# Patient Record
Sex: Female | Born: 1955
Health system: Southern US, Community
[De-identification: ages and names within clinical notes are randomized; demographics above are authoritative.]

## PROBLEM LIST (undated history)

## (undated) DIAGNOSIS — N760 Acute vaginitis: Secondary | ICD-10-CM

## (undated) DIAGNOSIS — E669 Obesity, unspecified: Secondary | ICD-10-CM

## (undated) DIAGNOSIS — J45909 Unspecified asthma, uncomplicated: Secondary | ICD-10-CM

## (undated) DIAGNOSIS — N289 Disorder of kidney and ureter, unspecified: Secondary | ICD-10-CM

## (undated) DIAGNOSIS — E785 Hyperlipidemia, unspecified: Secondary | ICD-10-CM

## (undated) DIAGNOSIS — B009 Herpesviral infection, unspecified: Secondary | ICD-10-CM

## (undated) DIAGNOSIS — N809 Endometriosis, unspecified: Secondary | ICD-10-CM

## (undated) DIAGNOSIS — K219 Gastro-esophageal reflux disease without esophagitis: Secondary | ICD-10-CM

## (undated) DIAGNOSIS — J302 Other seasonal allergic rhinitis: Secondary | ICD-10-CM

## (undated) DIAGNOSIS — N6089 Other benign mammary dysplasias of unspecified breast: Secondary | ICD-10-CM

## (undated) DIAGNOSIS — F329 Major depressive disorder, single episode, unspecified: Secondary | ICD-10-CM

## (undated) DIAGNOSIS — B9689 Other specified bacterial agents as the cause of diseases classified elsewhere: Secondary | ICD-10-CM

## (undated) DIAGNOSIS — E039 Hypothyroidism, unspecified: Secondary | ICD-10-CM

## (undated) DIAGNOSIS — I2510 Atherosclerotic heart disease of native coronary artery without angina pectoris: Secondary | ICD-10-CM

## (undated) DIAGNOSIS — F32A Depression, unspecified: Secondary | ICD-10-CM

## (undated) HISTORY — DX: Acute vaginitis: N76.0

## (undated) HISTORY — DX: Major depressive disorder, single episode, unspecified: F32.9

## (undated) HISTORY — DX: Unspecified asthma, uncomplicated: J45.909

## (undated) HISTORY — PX: LEG SURGERY: SHX1003

## (undated) HISTORY — DX: Endometriosis, unspecified: N80.9

## (undated) HISTORY — DX: Disorder of kidney and ureter, unspecified: N28.9

## (undated) HISTORY — PX: SINUS ENDO W/FUSION: SHX777

## (undated) HISTORY — DX: Depression, unspecified: F32.A

## (undated) HISTORY — DX: Other benign mammary dysplasias of unspecified breast: N60.89

## (undated) HISTORY — DX: Hyperlipidemia, unspecified: E78.5

## (undated) HISTORY — DX: Other seasonal allergic rhinitis: J30.2

## (undated) HISTORY — DX: Other specified bacterial agents as the cause of diseases classified elsewhere: B96.89

## (undated) HISTORY — DX: Herpesviral infection, unspecified: B00.9

## (undated) HISTORY — DX: Obesity, unspecified: E66.9

## (undated) HISTORY — PX: WISDOM TOOTH EXTRACTION: SHX21

## (undated) HISTORY — PX: TUBAL LIGATION: SHX77

## (undated) HISTORY — PX: OTHER SURGICAL HISTORY: SHX169

## (undated) HISTORY — DX: Atherosclerotic heart disease of native coronary artery without angina pectoris: I25.10

## (undated) HISTORY — PX: ABDOMINAL HYSTERECTOMY: SHX81

---

## 1998-06-17 ENCOUNTER — Other Ambulatory Visit: Admission: RE | Admit: 1998-06-17 | Discharge: 1998-06-17 | Payer: Self-pay | Admitting: Otolaryngology

## 1998-06-29 ENCOUNTER — Emergency Department (HOSPITAL_COMMUNITY): Admission: EM | Admit: 1998-06-29 | Discharge: 1998-06-29 | Payer: Self-pay | Admitting: Emergency Medicine

## 1998-09-01 ENCOUNTER — Encounter: Payer: Self-pay | Admitting: Family Medicine

## 1998-09-01 ENCOUNTER — Ambulatory Visit (HOSPITAL_COMMUNITY): Admission: RE | Admit: 1998-09-01 | Discharge: 1998-09-01 | Payer: Self-pay | Admitting: Family Medicine

## 2001-10-13 ENCOUNTER — Encounter: Payer: Self-pay | Admitting: *Deleted

## 2001-10-13 ENCOUNTER — Encounter: Admission: RE | Admit: 2001-10-13 | Discharge: 2001-10-13 | Payer: Self-pay | Admitting: *Deleted

## 2002-12-08 ENCOUNTER — Emergency Department (HOSPITAL_COMMUNITY): Admission: EM | Admit: 2002-12-08 | Discharge: 2002-12-08 | Payer: Self-pay | Admitting: Emergency Medicine

## 2003-05-19 ENCOUNTER — Ambulatory Visit (HOSPITAL_BASED_OUTPATIENT_CLINIC_OR_DEPARTMENT_OTHER): Admission: RE | Admit: 2003-05-19 | Discharge: 2003-05-19 | Payer: Self-pay | Admitting: Family Medicine

## 2004-05-21 ENCOUNTER — Emergency Department (HOSPITAL_COMMUNITY): Admission: EM | Admit: 2004-05-21 | Discharge: 2004-05-21 | Payer: Self-pay | Admitting: Emergency Medicine

## 2004-11-06 ENCOUNTER — Ambulatory Visit (HOSPITAL_COMMUNITY): Admission: RE | Admit: 2004-11-06 | Discharge: 2004-11-06 | Payer: Self-pay | Admitting: Obstetrics and Gynecology

## 2004-11-12 ENCOUNTER — Encounter: Admission: RE | Admit: 2004-11-12 | Discharge: 2004-11-12 | Payer: Self-pay | Admitting: Obstetrics and Gynecology

## 2004-11-17 ENCOUNTER — Other Ambulatory Visit: Admission: RE | Admit: 2004-11-17 | Discharge: 2004-11-17 | Payer: Self-pay | Admitting: Obstetrics and Gynecology

## 2005-05-24 ENCOUNTER — Encounter: Admission: RE | Admit: 2005-05-24 | Discharge: 2005-05-24 | Payer: Self-pay | Admitting: Obstetrics and Gynecology

## 2005-11-22 ENCOUNTER — Encounter: Admission: RE | Admit: 2005-11-22 | Discharge: 2005-11-22 | Payer: Self-pay | Admitting: Obstetrics and Gynecology

## 2005-11-24 ENCOUNTER — Other Ambulatory Visit: Admission: RE | Admit: 2005-11-24 | Discharge: 2005-11-24 | Payer: Self-pay | Admitting: Obstetrics and Gynecology

## 2005-11-29 ENCOUNTER — Encounter: Admission: RE | Admit: 2005-11-29 | Discharge: 2005-11-29 | Payer: Self-pay | Admitting: Obstetrics and Gynecology

## 2005-12-01 ENCOUNTER — Ambulatory Visit: Payer: Self-pay | Admitting: Gastroenterology

## 2006-01-27 ENCOUNTER — Ambulatory Visit: Payer: Self-pay | Admitting: Gastroenterology

## 2006-11-28 ENCOUNTER — Encounter: Admission: RE | Admit: 2006-11-28 | Discharge: 2006-11-28 | Payer: Self-pay | Admitting: Obstetrics and Gynecology

## 2007-03-01 ENCOUNTER — Observation Stay (HOSPITAL_COMMUNITY): Admission: EM | Admit: 2007-03-01 | Discharge: 2007-03-02 | Payer: Self-pay | Admitting: Emergency Medicine

## 2007-12-15 ENCOUNTER — Encounter: Admission: RE | Admit: 2007-12-15 | Discharge: 2007-12-15 | Payer: Self-pay | Admitting: Obstetrics and Gynecology

## 2008-08-20 IMAGING — CR DG CHEST 1V PORT
1 series · 1 of 1 positions shown · non-contrast
Comparison: 05/21/04.

CLINICAL DATA: Chest pain and left arm numbness. 
 PORTABLE CHEST ? 1 VIEW:

[view not recorded]
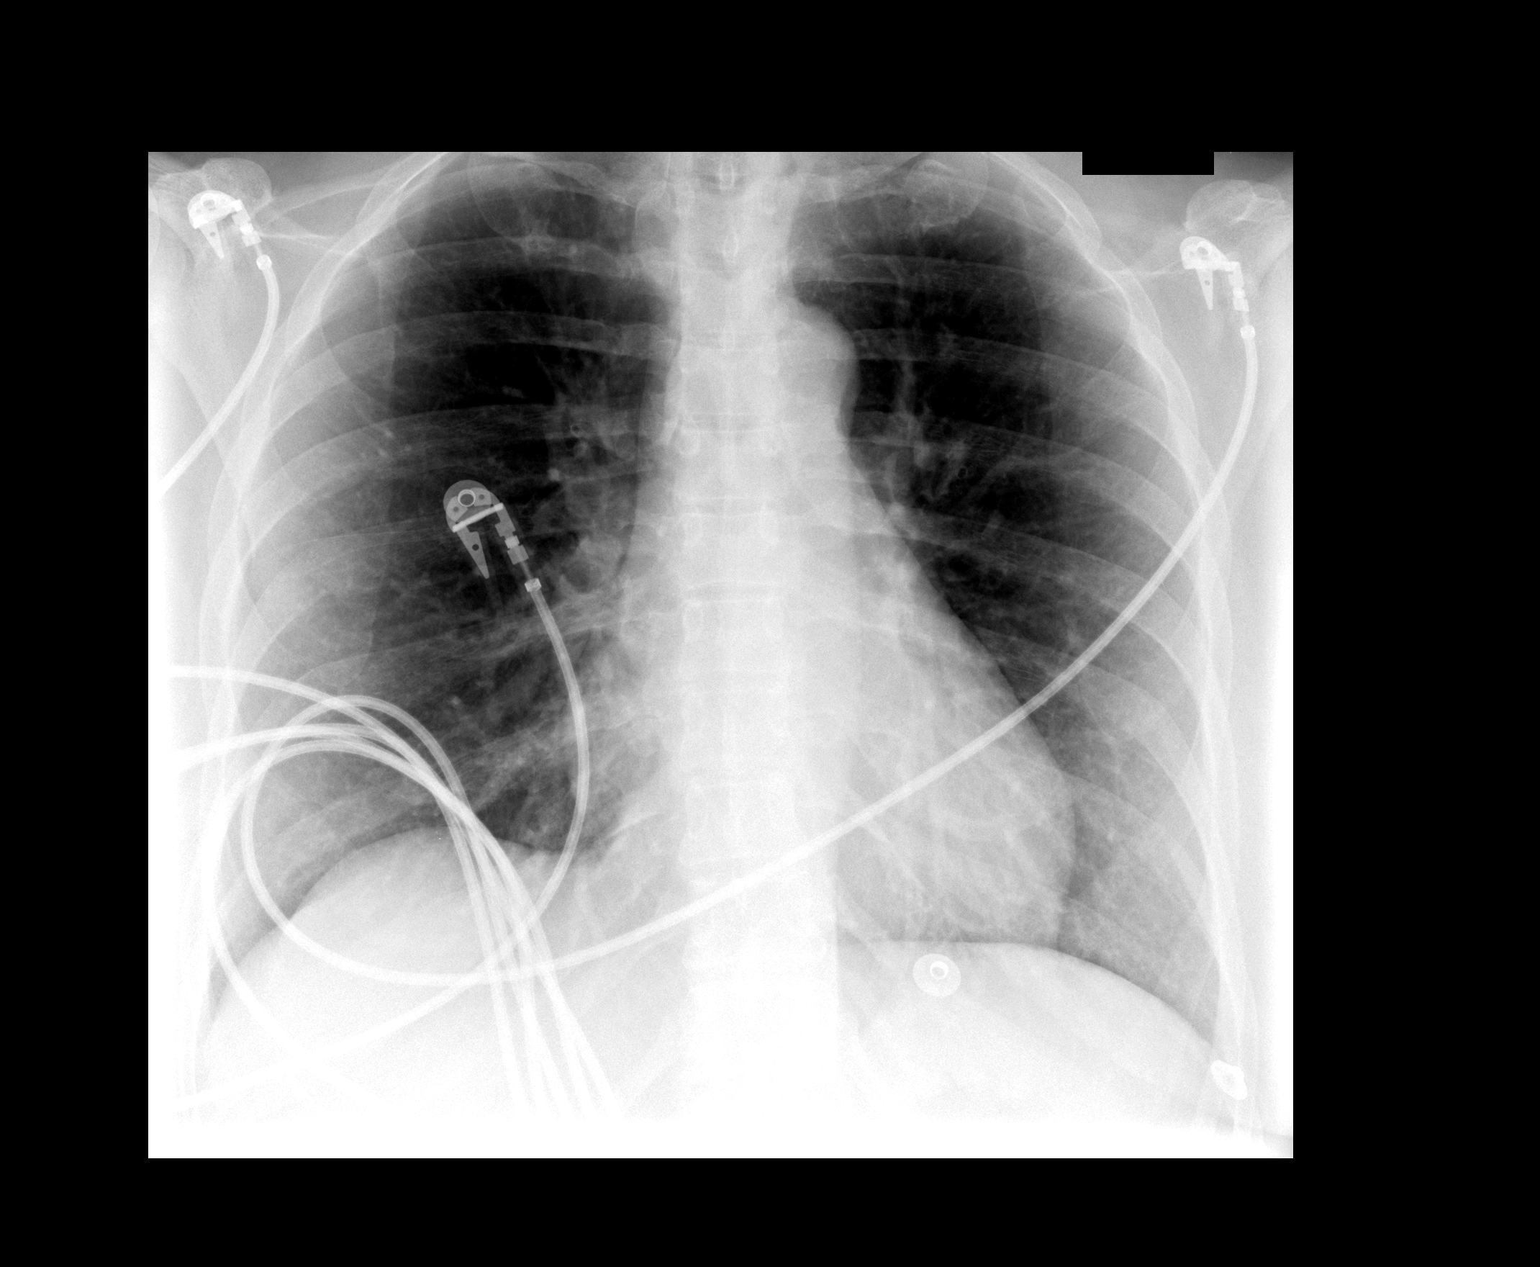

[1 of 1 positions shown; findings below may reference images not displayed]

FINDINGS: Trachea is midline.  Heart size is within normal limits.  Irregular nodular density in the right apex is thought to be due to right 1st costochondral junction, as it is seen in a slightly different projection on the prior study.  Lungs are clear.  No pleural fluid.
IMPRESSION: No acute findings.

## 2009-01-01 ENCOUNTER — Encounter: Admission: RE | Admit: 2009-01-01 | Discharge: 2009-01-01 | Payer: Self-pay | Admitting: Obstetrics and Gynecology

## 2010-01-13 ENCOUNTER — Encounter: Admission: RE | Admit: 2010-01-13 | Discharge: 2010-01-13 | Payer: Self-pay | Admitting: Obstetrics and Gynecology

## 2010-12-22 NOTE — Consult Note (Signed)
[name], [name]              ACCOUNT NO.:  [id number]   MEDICAL RECORD NO.:  [id number]          PATIENT TYPE:  OBV   LOCATION:  4731                         FACILITY:  [facility]   PHYSICIAN:  Corky Crafts, MDDATE OF BIRTH:  1956-03-04   DATE OF CONSULTATION:  03/01/2007  DATE OF DISCHARGE:  03/02/2007                                 CONSULTATION   REASON FOR CONSULTATION:  Chest pain.   HISTORY OF PRESENT ILLNESS:  The patient is a 55 year old women with no  known cardiac problems who had intermittent chest pressure with  radiation into the left shoulder and left arm.  It had been occurring  over the past 2 days.  It was worse this morning so she came to the  emergency room.  It is not related to food.  It is worse with activity.  There is no associated dyspnea, palpitations, PND, dizziness or syncope.  She still reports some chest pain now.  She is under a fair amount of  stress as her son is getting married on Saturday and she has a lot of  preparation to do.   ALLERGIES:  ERYTHROMYCIN CAUSES GI UPSET.   MEDICATIONS:  Premarin, Zyrtec and Mucinex.   SOCIAL HISTORY:  The patient does not smoke.  She does not drink.  She  does not use any illicit drugs.  She works as a IT consultant.   FAMILY HISTORY:  She has a half brother who has coronary artery disease.  Her mom has thyroid problems.   PAST MEDICAL HISTORY:  Gestational diabetes, elevated cholesterol.   PAST SURGICAL HISTORY:  Hysterectomy in 2005.   REVIEW OF SYSTEMS:  Chest pain radiating to the left arm as noted above.  No fever, chills.  No weight loss.  No focal weakness.  No shortness of  breath.  No rash.  All other systems are negative.   PHYSICAL EXAMINATION:  Blood pressure 106/59, pulse 56, respiratory rate  17.  GENERAL:  She is awake, alert in no apparent distress.  HEENT:  Head is normocephalic, atraumatic.  Eyes:  Extraocular movements  are intact.  NECK:  No carotid bruits.  CARDIOVASCULAR:   Regular rate and rhythm.  S1, S2.  LUNGS:  Clear to auscultation bilaterally.  ABDOMEN:  Soft, nontender, nondistended.  EXTREMITIES:  Showed no edema.  SKIN:  Warm and dry.  No rash.   LABORATORY DATA:  Creatinine 0.9, hemoglobin 32.6.  Cardiac enzymes  negative x1.  Chest x-ray pending.  EKG shows normal sinus rhythm.  No  ST-T wave changes.  Normal EKG.   ASSESSMENT:  A 55 year old with chest pain with some typical features,  although some risk factors for heart disease.   PLAN:  1. Given that she is still having pain, will watch her overnight, rule      out for MI.  2 . Anticoagulate her with Lovenox, will give her nitroglycerin.  1. Will plan for a stress Cardiolite in the morning.  2. Also check TSH, D-dimer.  3. Will check a fasting lipid profile as well.      Corky Crafts, MD  Electronically Signed     JSV/MEDQ  D:  03/02/2007  T:  03/02/2007  Job:  [id number]

## 2010-12-22 NOTE — H&P (Signed)
[name], [name]              ACCOUNT NO.:  [id number]   MEDICAL RECORD NO.:  [id number]          PATIENT TYPE:  EMS   LOCATION:  MAJO                         FACILITY:  [facility]   PHYSICIAN:  Corinna L. Lendell Caprice, MDDATE OF BIRTH:  05-21-1956   DATE OF ADMISSION:  03/01/2007  DATE OF DISCHARGE:                              HISTORY & PHYSICAL   CHIEF COMPLAINT:  Chest pain.   HISTORY OF PRESENT ILLNESS:  [name] as a pleasant 55 year old white  female with history of hyperlipidemia who presents to the emergency room  with substernal chest pressure which has been episodic since yesterday.  It is not associated with activity.  There are no exacerbating or  alleviating factors.  She rates the pressure as a 7/10 at its worst.  It  has been lasting about 4 minutes at a time.  She had some soreness of  her chest when she arrived in the emergency room, and it resolved with  two sublingual nitroglycerins.  The pain radiates to her left arm,  particularly to her shoulder, and she has numbness in her left hand when  this occurs.  She has some diaphoresis but no other symptoms associated.  She has never had cardiac workup.  She has no cardiac risk factors other  than the cholesterol as mentioned above.  She has been having a lot of  stress with family members being ill and upcoming wedding of her son.  She thinks this may be stress related.  She also traveled to [place]  last week for the funeral of her stepmother.  She has had no leg pain or  swelling.  She has had no shortness of breath, but she feels like when  this pain occurs she tries not to take a deep breath.  The pain is not  pleuritic.   PAST MEDICAL HISTORY:  1. Hysterectomy for endometriosis.  2. Chronic sinusitis with requiring three sinus surgeries.   MEDICATIONS:  1. Premarin dose unknown.  2. Zyrtec D p.r.n.  3. Flonase occasionally.  4. Mucinex.  5. No medication for cholesterol.   ALLERGIES:  SHE REPORTS A GI  INTOLERANCE TO ERYTHROMYCIN.   FAMILY HISTORY:  Her brother has heart problems but has never had a  myocardial infarction.   SOCIAL HISTORY:  She is married.  She is a IT consultant.  She does not  drink, smoke or use drugs.   REVIEW OF SYSTEMS:  As above, otherwise negative.   PHYSICAL EXAMINATION:  VITAL SIGNS:  Temperature is 97.6, blood pressure  120/68, pulse 71, respiratory rate 18, oxygen saturation 96% on room  air.  GENERAL:  The patient is well-nourished, well-developed and appears  slightly anxious.  HEENT: Normocephalic, atraumatic.  Pupils equal, round, reactive to not  to light.  Extraocular movements are intact.  She has boggy turbinates.  Oropharynx is without erythema or postnasal drip.  NECK:  Supple.  No carotid bruits.  No thyromegaly, JVD.  LUNGS:  Clear to auscultation bilaterally without wheezes, rhonchi or  rales.  CARDIOVASCULAR:  Regular rate and rhythm without murmurs, gallops or  rubs.  ABDOMEN:  Soft,  nontender, nondistended.  GU/RECTAL:  deferred.  Extremities: No clubbing, cyanosis or edema.  No  calf tenderness.  Homan sign negative.  PSYCHIATRIC:  As above.  NEUROLOGIC:  Alert and oriented.  Cranial nerves and sensorimotor exam  are intact.  SKIN:  No rash.   LABORATORY DATA:  CBC is significant for hemoglobin of 11.6, hematocrit  of 33.6, MCV is 84.  Basic metabolic panel unremarkable.  Point care  enzymes negative times one.  EKG shows normal sinus rhythm.  Chest x-ray  is negative.   ASSESSMENT/PLAN:  1. Chest pain.  The patient will be placed on observation, I have      consulted [facility].  She may need a stress test.  I will      also check a D-dimer, but her pain is not very characteristic of      pulmonary embolus.  She received aspirin today, and I will continue      this as well as morphine as needed and Lovenox for DVT prophylaxis.      For now, I will make her n.p.o., but if Cardiology does not do a      stress test, she can  eat a low fat diet.  2. Hyperlipidemia.  Apparently she was encouraged to modify lifestyle      factors and then was to have a repeat lipid panel in the future.  I      will check a fasting lipid panel tomorrow.  completes medication      thinks      Corinna L. Lendell Caprice, MD  Electronically Signed     CLS/MEDQ  D:  03/01/2007  T:  03/01/2007  Job:  [id number]   cc:   Molly Maduro A. Nicholos Johns, M.D.

## 2010-12-22 NOTE — Discharge Summary (Signed)
[name], [name]              ACCOUNT NO.:  [id number]   MEDICAL RECORD NO.:  [id number]          PATIENT TYPE:  OBV   LOCATION:  4731                         FACILITY:  [facility]   PHYSICIAN:  Corinna L. Lendell Caprice, MDDATE OF BIRTH:  Jun 16, 1956   DATE OF ADMISSION:  03/01/2007  DATE OF DISCHARGE:  03/02/2007                               DISCHARGE SUMMARY   DISCHARGE DIAGNOSES:  1. Chest pain, suspect esophageal spasm.  2. Hyperlipidemia.   DISCHARGE MEDICATIONS:  1. Prilosec 20 mg a day.  2. She may continue her other medications as previous.   FOLLOWUP:  Followup with Dr. Nicholos Johns as needed.   CONDITION ON DISCHARGE:  Condition stable.   CONSULTATIONS:  Dr. Eldridge Dace.   PROCEDURE:  None.   DIET:  Diet should be low cholesterol.   ACTIVITY:  Activity ad lib.   PERTINENT LABORATORY AND X-RAY DATA:  Serial cardiac enzymes negative, D-  dimer less than 0.22.  CBC significant for hemoglobin of 11.6,  hematocrit 33.6.  Total cholesterol 197, LDL 107, HDL 41,, triglycerides  are 243.  TSH 4.490.   Special studies in radiology; chest x-Ray negative. Stress Cardiolite no  ischemia, ejection fraction 84%.  EKG normal sinus rhythm.   HISTORY AND HOSPITAL COURSE:  [name] is 55 year old white female  patient with hyperlipidemia who presented with chest pressure that  radiated to her left arm, please see H and P for complete details. She  had resolution of her symptoms with sublingual nitroglycerin.  She was  placed on 23-hour observation on telemetry where she remained in normal  sinus rhythm.  MI was ruled out.  She was continued on aspirin.  Cardiology was consulted and the patient was started on Lovenox and  nitroglycerin drip. A Cardiolite was negative.  I suspect she  has most likely silent esophageal reflux disease and possibly some  spasms.  I have therefore written a prescription for Prilosec.  She also  reports having a lot of life stressors and this may also be related  to  anxiety, although she had no feelings of panic during the episodes of  chest pain. Her physical exam was unremarkable as were her vital signs.      Corinna L. Lendell Caprice, MD  Electronically Signed     CLS/MEDQ  D:  03/02/2007  T:  03/03/2007  Job:  [id number]   cc:   Molly Maduro A. Nicholos Johns, M.D.

## 2011-01-08 ENCOUNTER — Other Ambulatory Visit: Payer: Self-pay | Admitting: Obstetrics and Gynecology

## 2011-01-08 DIAGNOSIS — Z1231 Encounter for screening mammogram for malignant neoplasm of breast: Secondary | ICD-10-CM

## 2011-01-20 ENCOUNTER — Ambulatory Visit
Admission: RE | Admit: 2011-01-20 | Discharge: 2011-01-20 | Disposition: A | Discharge status: Home / Self Care | Payer: PRIVATE HEALTH INSURANCE | Source: Ambulatory Visit | Attending: Obstetrics and Gynecology | Admitting: Obstetrics and Gynecology

## 2011-01-20 DIAGNOSIS — Z1231 Encounter for screening mammogram for malignant neoplasm of breast: Secondary | ICD-10-CM

## 2011-01-22 ENCOUNTER — Other Ambulatory Visit: Payer: Self-pay | Admitting: Obstetrics and Gynecology

## 2011-01-22 DIAGNOSIS — R928 Other abnormal and inconclusive findings on diagnostic imaging of breast: Secondary | ICD-10-CM

## 2011-01-27 ENCOUNTER — Ambulatory Visit
Admission: RE | Admit: 2011-01-27 | Discharge: 2011-01-27 | Disposition: A | Discharge status: Home / Self Care | Payer: PRIVATE HEALTH INSURANCE | Source: Ambulatory Visit | Attending: Obstetrics and Gynecology | Admitting: Obstetrics and Gynecology

## 2011-01-27 DIAGNOSIS — R928 Other abnormal and inconclusive findings on diagnostic imaging of breast: Secondary | ICD-10-CM

## 2011-05-24 LAB — CBC
HCT: 33.6 — ABNORMAL LOW
Hemoglobin: 11.6 — ABNORMAL LOW
MCHC: 34.4
MCV: 84.5
RDW: 12.2

## 2011-05-24 LAB — DIFFERENTIAL
Basophils Absolute: 0.1
Basophils Relative: 1
Eosinophils Absolute: 0.1
Lymphocytes Relative: 28
Monocytes Relative: 6

## 2011-05-24 LAB — I-STAT 8, (EC8 V) (CONVERTED LAB)
Bicarbonate: 24
Chloride: 107
Glucose, Bld: 88
HCT: 34 — ABNORMAL LOW
Potassium: 3.8
TCO2: 25
pCO2, Ven: 35.1 — ABNORMAL LOW

## 2011-05-24 LAB — LIPID PANEL: VLDL: 49 — ABNORMAL HIGH

## 2011-05-24 LAB — TROPONIN I: Troponin I: 0.01

## 2011-05-24 LAB — CARDIAC PANEL(CRET KIN+CKTOT+MB+TROPI)
CK, MB: 0.7
Relative Index: INVALID
Total CK: 34
Total CK: 35
Troponin I: 0.01

## 2011-05-24 LAB — CK TOTAL AND CKMB (NOT AT ARMC)
CK, MB: 0.8
Relative Index: INVALID
Total CK: 37

## 2011-05-24 LAB — D-DIMER, QUANTITATIVE: D-Dimer, Quant: <0.22

## 2011-05-24 LAB — PROTIME-INR: INR: 1

## 2011-05-24 LAB — POCT CARDIAC MARKERS
Myoglobin, poc: 62.8
Operator id: 198171

## 2011-07-10 ENCOUNTER — Encounter: Payer: Self-pay | Admitting: *Deleted

## 2011-07-10 ENCOUNTER — Emergency Department (HOSPITAL_COMMUNITY)
Admission: EM | Admit: 2011-07-10 | Discharge: 2011-07-11 | Disposition: A | Discharge status: Home / Self Care | Payer: PRIVATE HEALTH INSURANCE | Attending: Emergency Medicine | Admitting: Emergency Medicine

## 2011-07-10 ENCOUNTER — Emergency Department (HOSPITAL_COMMUNITY): Payer: PRIVATE HEALTH INSURANCE

## 2011-07-10 DIAGNOSIS — K7689 Other specified diseases of liver: Secondary | ICD-10-CM | POA: Insufficient documentation

## 2011-07-10 DIAGNOSIS — R1013 Epigastric pain: Secondary | ICD-10-CM | POA: Insufficient documentation

## 2011-07-10 DIAGNOSIS — IMO0001 Reserved for inherently not codable concepts without codable children: Secondary | ICD-10-CM

## 2011-07-10 DIAGNOSIS — R109 Unspecified abdominal pain: Secondary | ICD-10-CM | POA: Insufficient documentation

## 2011-07-10 DIAGNOSIS — K219 Gastro-esophageal reflux disease without esophagitis: Secondary | ICD-10-CM | POA: Insufficient documentation

## 2011-07-10 HISTORY — DX: Hypothyroidism, unspecified: E03.9

## 2011-07-10 HISTORY — DX: Gastro-esophageal reflux disease without esophagitis: K21.9

## 2011-07-10 LAB — COMPREHENSIVE METABOLIC PANEL
ALT: 9 U/L (ref 0–35)
AST: 11 U/L (ref 0–37)
Albumin: 3.4 g/dL — ABNORMAL LOW (ref 3.5–5.2)
Alkaline Phosphatase: 54 U/L (ref 39–117)
BUN: 15 mg/dL (ref 6–23)
CO2: 27 mEq/L (ref 19–32)
Calcium: 9 mg/dL (ref 8.4–10.5)
Chloride: 108 mEq/L (ref 96–112)
Creatinine, Ser: 0.84 mg/dL (ref 0.50–1.10)
GFR calc Af Amer: 89 mL/min — ABNORMAL LOW (ref >90)
GFR calc non Af Amer: 77 mL/min — ABNORMAL LOW (ref >90)
Glucose, Bld: 89 mg/dL (ref 70–99)
Potassium: 4 mEq/L (ref 3.5–5.1)
Sodium: 141 mEq/L (ref 135–145)
Total Bilirubin: 0.3 mg/dL (ref 0.3–1.2)
Total Protein: 6.3 g/dL (ref 6.0–8.3)

## 2011-07-10 LAB — URINALYSIS, ROUTINE W REFLEX MICROSCOPIC
Bilirubin Urine: NEGATIVE
Glucose, UA: NEGATIVE mg/dL
Hgb urine dipstick: NEGATIVE
Ketones, ur: NEGATIVE mg/dL
Leukocytes, UA: NEGATIVE
Nitrite: NEGATIVE
Protein, ur: NEGATIVE mg/dL
Specific Gravity, Urine: 1.006 (ref 1.005–1.030)
Urobilinogen, UA: 0.2 mg/dL (ref 0.0–1.0)
pH: 6 (ref 5.0–8.0)

## 2011-07-10 LAB — CBC
HCT: 33.6 % — ABNORMAL LOW (ref 36.0–46.0)
Hemoglobin: 11.1 g/dL — ABNORMAL LOW (ref 12.0–15.0)
MCH: 28.2 pg (ref 26.0–34.0)
MCHC: 33 g/dL (ref 30.0–36.0)
MCV: 85.3 fL (ref 78.0–100.0)
Platelets: 211 10*3/uL (ref 150–400)
RBC: 3.94 MIL/uL (ref 3.87–5.11)
RDW: 12.8 % (ref 11.5–15.5)
WBC: 8.2 10*3/uL (ref 4.0–10.5)

## 2011-07-10 LAB — DIFFERENTIAL
Basophils Absolute: 0 10*3/uL (ref 0.0–0.1)
Basophils Relative: 0 % (ref 0–1)
Eosinophils Absolute: 0.2 10*3/uL (ref 0.0–0.7)
Eosinophils Relative: 2 % (ref 0–5)
Lymphocytes Relative: 39 % (ref 12–46)
Lymphs Abs: 3.2 10*3/uL (ref 0.7–4.0)
Monocytes Absolute: 0.5 10*3/uL (ref 0.1–1.0)
Monocytes Relative: 7 % (ref 3–12)
Neutro Abs: 4.3 10*3/uL (ref 1.7–7.7)
Neutrophils Relative %: 52 % (ref 43–77)

## 2011-07-10 LAB — POCT I-STAT TROPONIN I: Troponin i, poc: 0 ng/mL (ref 0.00–0.08)

## 2011-07-10 LAB — LIPASE, BLOOD: Lipase: 25 U/L (ref 11–59)

## 2011-07-10 MED ORDER — MORPHINE SULFATE 4 MG/ML IJ SOLN: 4.0000 mg | Freq: Once | INTRAMUSCULAR | Status: DC

## 2011-07-10 MED ORDER — MORPHINE SULFATE 2 MG/ML IJ SOLN
INTRAMUSCULAR | Status: AC
  Administered 2011-07-10: 4 mg via INTRAVENOUS
  Filled 2011-07-10: qty 2

## 2011-07-10 MED ORDER — SODIUM CHLORIDE 0.9 % IV SOLN: INTRAVENOUS | Status: DC

## 2011-07-10 MED ORDER — GI COCKTAIL ~~LOC~~
30.0000 mL | Freq: Once | ORAL | Status: AC
  Administered 2011-07-10: 30 mL via ORAL
  Filled 2011-07-10: qty 30

## 2011-07-10 MED ORDER — ONDANSETRON HCL 4 MG/2ML IJ SOLN
4.0000 mg | Freq: Once | INTRAMUSCULAR | Status: AC
  Administered 2011-07-10: 4 mg via INTRAVENOUS
  Filled 2011-07-10: qty 2

## 2011-07-10 NOTE — ED Notes (Signed)
Pt states that she is experiencing epigastric pain since roughly 1300 today.  Pt denies N/V/D or fever.  Pt also denies hx of same.

## 2011-07-10 NOTE — ED Notes (Signed)
Pt states she had an onset of upper abd pain going through to her back, knife like pain, nausea with pain

## 2011-07-10 NOTE — ED Provider Notes (Signed)
History     CSN: [id number] Arrival date & time: 07/10/2011  6:15 PM   First MD Initiated Contact with Patient 07/10/11 2028      Chief Complaint  Patient presents with  . Abdominal Pain     HPI: Reports onset of severe epigastric pain today at approx 1400 approx 1 hr after eating a meal. Pain has been constant, sharp and associated w/ severe nausea w/o vomiting. Denies fever, SOB or other associated sx's.   Past Medical History  Diagnosis Date  . Acid reflux   . Hypothyroid     Past Surgical History  Procedure Date  . Abdominal hysterectomy   . Cesarean section     No family history on file.  History  Substance Use Topics  . Smoking status: Never Smoker   . Smokeless tobacco: Never Used  . Alcohol Use: No    OB History    Grav Para Term Preterm Abortions TAB SAB Ect Mult Living                  Review of Systems  Constitutional: Negative.   HENT: Negative.   Eyes: Negative.   Respiratory: Negative.   Cardiovascular: Negative.   Gastrointestinal: Negative.   Genitourinary: Negative.   Musculoskeletal: Negative.   Skin: Negative.   Neurological: Negative.   Hematological: Negative.   Psychiatric/Behavioral: Negative.     Allergies  Erythromycin; Augmentin; and Darvocet  Home Medications   Current Outpatient Rx  Name Route Sig Dispense Refill  . CALCIUM CARBONATE-VITAMIN D 500-200 MG-UNIT PO TABS Oral Take 1 tablet by mouth daily.      Marland Kitchen CYPROHEPTADINE HCL 4 MG PO TABS Oral Take 4 mg by mouth at bedtime.     . DEXLANSOPRAZOLE 60 MG PO CPDR Oral Take 60 mg by mouth daily.      Marland Kitchen ESTROGENS CONJUGATED 0.9 MG PO TABS Oral Take 0.9 mg by mouth daily.     Marland Kitchen HYOSCYAMINE SULFATE 0.125 MG SL SUBL Sublingual Place 0.125 mg under the tongue every 4 (four) hours as needed. Irritable bowel    . LEVOTHYROXINE SODIUM 75 MCG PO TABS Oral Take 75 mcg by mouth daily.      Carma Leaven M PLUS PO TABS Oral Take 1 tablet by mouth daily.      Marland Kitchen VITAMIN B-12 1000 MCG PO TABS  Oral Take 1,000 mcg by mouth daily.        BP 127/72  Pulse 62  Temp(Src) 97.4 F (36.3 C) (Oral)  Resp 17  SpO2 97%  Physical Exam  Constitutional: She is oriented to person, place, and time. She appears well-developed and well-nourished.  HENT:  Head: Normocephalic and atraumatic.  Eyes: Conjunctivae are normal.  Neck: Neck supple.  Cardiovascular: Normal rate and regular rhythm.   Pulmonary/Chest: Effort normal and breath sounds normal.  Abdominal: Soft. Bowel sounds are normal.    Musculoskeletal: Normal range of motion.  Neurological: She is alert and oriented to person, place, and time. She has normal reflexes.  Skin: Skin is warm and dry.  Psychiatric: She has a normal mood and affect.    ED Course  Procedures Pt reports pain much improved w/ IVF's and medication. Findings, impression and d/c plan discussed. Pt agreeable w/ plan.  Labs Reviewed  CBC - Abnormal; Notable for the following:    Hemoglobin 11.1 (*)    HCT 33.6 (*)    All other components within normal limits  COMPREHENSIVE METABOLIC PANEL - Abnormal; Notable for the  following:    Albumin 3.4 (*)    GFR calc non Af Amer 77 (*)    GFR calc Af Amer 89 (*)    All other components within normal limits  DIFFERENTIAL  LIPASE, BLOOD  URINALYSIS, ROUTINE W REFLEX MICROSCOPIC  POCT I-STAT TROPONIN I  I-STAT TROPONIN I   US Abdomen Complete  07/10/2011  *RADIOLOGY REPORT*  Clinical Data:  Epigastric pain and nausea  ABDOMINAL ULTRASOUND COMPLETE  Comparison:  None.  Findings:  Gallbladder:  No gallstones, gallbladder wall thickening, or pericholecystic fluid.Negative sonographic Murphy's sign.  Common Bile Duct:  Within normal limits in caliber.Measures 5 mm.  Liver: A circumscribed 1.1 x 1.4 x 1.3 cm cyst with a single thin internal septation is noted in the left lobe.  Within normal limits in parenchymal echogenicity.  IVC:  Appears normal.  Pancreas:  Although the pancreas is difficult to visualize in its  entirety, no focal pancreatic abnormality is identified.  Spleen:  Within normal limits in size and echotexture.  Right kidney:  Normal in size and parenchymal echogenicity.  No evidence of mass or hydronephrosis.  Left kidney:  Normal in size and parenchymal echogenicity.  No evidence of mass or hydronephrosis.  Abdominal Aorta:  No aneurysm identified.  IMPRESSION:  1.  No acute findings are explanation for right upper quadrant abdominal pain. 2.  1.4 cm left hepatic lobe cyst.  Original Report Authenticated By: Britta Mccreedy, M.D.     No diagnosis found.    MDM  HPI highly suspicious for cholelithiasis, however U/S negative for acute upper abd process. Cardiac source of pain considered, though HPI and PE do not support. EKG normal and Troponin I negatice. Suspect Reflux as pt has hx and is already on PPI. Will d/c home w/ meds for pain and nausea and encourage close f/u w/ PCP this week.      Leanne Chang, NP 07/11/11 [phone]

## 2011-07-10 NOTE — ED Provider Notes (Cosign Needed)
Date: 9:58 PM    Rate: 56  Rhythm: normal sinus rhythm  QRS Axis: normal  Intervals: normal  ST/T Wave abnormalities: normal  Conduction Disutrbances:none  Narrative Interpretation:   Old EKG Reviewed: none available  Medical screening examination/treatment/procedure(s) were performed by non-physician practitioner and as supervising physician I was immediately available for consultation/collaboration.   Celene Kras, MD 07/10/11 2159

## 2011-07-11 MED ORDER — HYDROCODONE-ACETAMINOPHEN 5-325 MG PO TABS: 1.0000 | ORAL_TABLET | ORAL | Status: AC | PRN

## 2011-07-11 MED ORDER — ONDANSETRON HCL 4 MG PO TABS: 4.0000 mg | ORAL_TABLET | Freq: Four times a day (QID) | ORAL | Status: AC

## 2011-07-11 NOTE — ED Provider Notes (Signed)
Medical screening examination/treatment/procedure(s) were performed by non-physician practitioner and as supervising physician I was immediately available for consultation/collaboration.  Celene Kras, MD 07/11/11 [phone]

## 2012-01-17 ENCOUNTER — Other Ambulatory Visit: Payer: Self-pay | Admitting: Obstetrics and Gynecology

## 2012-01-17 DIAGNOSIS — Z1231 Encounter for screening mammogram for malignant neoplasm of breast: Secondary | ICD-10-CM

## 2012-01-25 ENCOUNTER — Ambulatory Visit: Payer: PRIVATE HEALTH INSURANCE

## 2012-01-25 ENCOUNTER — Ambulatory Visit
Admission: RE | Admit: 2012-01-25 | Discharge: 2012-01-25 | Disposition: A | Discharge status: Home / Self Care | Payer: PRIVATE HEALTH INSURANCE | Source: Ambulatory Visit | Attending: Otolaryngology | Admitting: Otolaryngology

## 2012-01-25 ENCOUNTER — Other Ambulatory Visit: Payer: Self-pay | Admitting: Otolaryngology

## 2012-01-25 DIAGNOSIS — J3489 Other specified disorders of nose and nasal sinuses: Secondary | ICD-10-CM

## 2012-01-25 DIAGNOSIS — R0602 Shortness of breath: Secondary | ICD-10-CM

## 2012-03-06 ENCOUNTER — Ambulatory Visit
Admission: RE | Admit: 2012-03-06 | Discharge: 2012-03-06 | Disposition: A | Discharge status: Home / Self Care | Payer: PRIVATE HEALTH INSURANCE | Source: Ambulatory Visit | Attending: Obstetrics and Gynecology | Admitting: Obstetrics and Gynecology

## 2012-03-06 DIAGNOSIS — Z1231 Encounter for screening mammogram for malignant neoplasm of breast: Secondary | ICD-10-CM

## 2012-10-03 ENCOUNTER — Ambulatory Visit: Payer: PRIVATE HEALTH INSURANCE | Admitting: Obstetrics and Gynecology

## 2012-10-03 ENCOUNTER — Encounter: Payer: Self-pay | Admitting: Obstetrics and Gynecology

## 2012-10-03 VITALS — BP 120/70 | Temp 98.3°F | Ht 66.0 in | Wt 203.0 lb

## 2012-10-03 DIAGNOSIS — Z01419 Encounter for gynecological examination (general) (routine) without abnormal findings: Secondary | ICD-10-CM

## 2012-10-03 DIAGNOSIS — R32 Unspecified urinary incontinence: Secondary | ICD-10-CM

## 2012-10-03 LAB — POCT URINALYSIS DIPSTICK
Blood, UA: NEGATIVE
Nitrite, UA: NEGATIVE
Spec Grav, UA: 1.01
Urobilinogen, UA: NEGATIVE
pH, UA: 5

## 2012-10-03 MED ORDER — ESTRADIOL 10 MCG VA TABS: 10.0000 ug | ORAL_TABLET | VAGINAL | Status: DC

## 2012-10-03 MED ORDER — ESTROGENS CONJUGATED 0.9 MG PO TABS: 0.9000 mg | ORAL_TABLET | Freq: Every day | ORAL | Status: DC

## 2012-10-03 NOTE — Progress Notes (Signed)
Regular Periods: no Mammogram: yes  Monthly Breast Ex.: yes Exercise: yes  Tetanus < 10 years: yes Seatbelts: yes  NI. Bladder Functn.: no pt feels pressure;do not feel like she is empties bladder and has lost a little control with urination Abuse at home: no  Daily BM's: yes Stressful Work: yes  Healthy Diet: yes Sigmoid-Colonoscopy: age 57  Calcium: yes Medical problems this year: no other problems;just the bladder   LAST PAP:7/12  Contraception: btl  Mammogram:  6/13  PCP: Dr. Nicholos Johns  PMH: no change  FMH: no change  Last Bone Scan: age 89  Pt is married.

## 2012-10-03 NOTE — Progress Notes (Signed)
Subjective:    [name] is a 57 y.o. female, G3P3, who presents for an annual exam. The patient reports urinary incontinence that has worsened over the past 6 months.  Has noticed some lower back discomfort, mild discomfort with urination (mild burning), urgency but no constipation or bowel changes.  Menstrual cycle:   LMP: No LMP recorded. Patient has had a hysterectomy.             Review of Systems Pertinent items are noted in HPI. Denies pelvic pain, urinary tract symptoms, vaginitis symptoms, irregular bleeding, menopausal symptoms, change in bowel habits or rectal bleeding   Objective:    BP 120/70  Temp(Src) 98.3 F (36.8 C) (Oral)  Ht 5\' 6"  (1.676 m)  Wt 203 lb (92.08 kg)  BMI 32.78 kg/m2   Wt Readings from Last 1 Encounters:  10/03/12 203 lb (92.08 kg)   Body mass index is 32.78 kg/(m^2). General Appearance: Alert, no acute distress HEENT: Grossly normal Neck / Thyroid: Supple, no thyromegaly or cervical adenopathy Lungs: Clear to auscultation bilaterally Back: No CVA tenderness Breast Exam: No masses or nodes.No dimpling, nipple retraction or discharge. Cardiovascular: Regular rate and rhythm.  Gastrointestinal: Soft, non-tender, no masses or organomegaly Pelvic Exam: EGBUS-atrophic, vagina-atrophic with mild relaxation and no lesions;  cervix/uterus-surgically absent; adnexae-no masses or tenderness Rectovaginal: no masses and normal sphincter tone Lymphatic Exam: Non-palpable nodes in neck, clavicular,  axillary, or inguinal regions  Skin: no rashes or abnormalities Extremities: no clubbing cyanosis or edema  Neurologic: grossly normal Psychiatric: Alert and oriented   Assessment:   Routine GYN Exam Urinary Incontinence Vulvovaginal Atrophy   Plan:    Reviewed revised PAP guidelines for hysterectomized patients  Reviewed management options for incontinence-surgical and medical  Patient to try a trial of vaginal estrogen x 8 weeks for her  symptoms and if no results will schedule  consult with M.D.  RTO 1 year or prn  Tabathia Knoche,ELMIRAPA-C

## 2013-07-03 ENCOUNTER — Ambulatory Visit (INDEPENDENT_AMBULATORY_CARE_PROVIDER_SITE_OTHER): Payer: PRIVATE HEALTH INSURANCE | Admitting: Family Medicine

## 2013-07-03 ENCOUNTER — Encounter: Payer: Self-pay | Admitting: Family Medicine

## 2013-07-03 VITALS — BP 156/93 | HR 68 | Ht 66.0 in | Wt 195.0 lb

## 2013-07-03 DIAGNOSIS — M2140 Flat foot [pes planus] (acquired), unspecified foot: Secondary | ICD-10-CM

## 2013-07-03 DIAGNOSIS — S86899A Other injury of other muscle(s) and tendon(s) at lower leg level, unspecified leg, initial encounter: Secondary | ICD-10-CM

## 2013-07-03 DIAGNOSIS — IMO0002 Reserved for concepts with insufficient information to code with codable children: Secondary | ICD-10-CM

## 2013-07-03 NOTE — Progress Notes (Addendum)
CC: Bilateral shin pain, for orthotics HPI: Patient is a very pleasant 57 year old female who presents for evaluation of bilateral shin pain and desire for custom orthotics today. She is currently in a running group and is trying to work up to running a 5K. They're doing 4 miles 2 times per week. However, every time she tries to start running she gets pain in her bilateral shins, right greater than left. She does have a pair of orthotics that was 57 years old that was made for her when she had refractory plantar fasciitis. These are extremely worn down and broken down and when she tried to wear them in her shoes they were very uncomfortable. She has continued to have shown pain and has been unable to run as a result. She was noticed at the orthopedic office to have pes planus and over pronation and so was sent here for evaluation for custom orthotics.  ROS: As above in the HPI. All other systems are stable or negative.  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 She does not smoke. She drinks alcohol socially about once a month. Family: Family history is positive for diabetes in grandmother, heart disease in grandparents and parents, and high blood pressure in grandmother  Allergies: Erythromycin and Augmentin    OBJECTIVE: APPEARANCE:  Patient in no acute distress.The patient appeared well nourished and normally developed. HEENT: No scleral icterus. Conjunctiva non-injected Resp: Non labored Skin: No rash MSK:  - Patient is diffusely tender to palpation along her bilateral shins at the medial border of the tibia - Pes planus with pronation - Normal motion of the great toe bilaterally with no hallux rigidus - She is able to perform a heel raise with normal posterior tibialis function - Weakness on hip abduction and extension bilaterally - Normal hip flexor strength   ASSESSMENT: #1.  Bilateral medial tibial  stress syndrome in a patient with significant pes planus and pronation as well as history of refractory plantar fasciitis  PLAN: Patient will be fitted for custom orthotic today. I recommended that she start to wear these immediately in her running shoes and she moves them to any shoes that she is going to be active in. She already does have good quality work shoes with excellent arch support such do not think she needs any orthotic in her work shoes. I have encouraged her to gradually ease back into running with alternating walking and jogging. She is okay to try the [place] trot on Thanksgiving. She was also given some hip exercises including hip abduction, extension, and standing hip rotation is to help correct her hip weakness. I also gave her some eccentric heel raises and foot dorsi flexion exercises to help with her shin splints.  Patient was fitted for a : standard, cushioned, semi-rigid orthotic. The orthotic was heated and afterward the patient stood on the orthotic blank positioned on the orthotic stand. The patient was positioned in subtalar neutral position and 10 degrees of ankle dorsiflexion in a weight bearing stance. After completion of molding, a stable base was applied to the orthotic blank. The blank was ground to a stable position for weight bearing. Size: 7 Base: EVA Posting: None Additional orthotic padding: None  Greater than 45 min was spent in this encounter for history, exam, and creation of custom orthotics.

## 2013-07-03 NOTE — Patient Instructions (Signed)
Thank you for coming in today  Try your orthotics out - call us if you need any adjustments Okay to try [place] trot Do hip and lower leg exercises daily Okay to start running as tolerated.

## 2013-07-11 ENCOUNTER — Encounter: Payer: PRIVATE HEALTH INSURANCE | Admitting: Emergency Medicine

## 2013-10-02 ENCOUNTER — Other Ambulatory Visit: Payer: Self-pay

## 2013-10-02 DIAGNOSIS — Z1231 Encounter for screening mammogram for malignant neoplasm of breast: Secondary | ICD-10-CM

## 2013-10-15 ENCOUNTER — Ambulatory Visit
Admission: RE | Admit: 2013-10-15 | Discharge: 2013-10-15 | Disposition: A | Discharge status: Home / Self Care | Payer: PRIVATE HEALTH INSURANCE | Source: Ambulatory Visit

## 2013-10-15 DIAGNOSIS — Z1231 Encounter for screening mammogram for malignant neoplasm of breast: Secondary | ICD-10-CM

## 2013-11-04 ENCOUNTER — Encounter (HOSPITAL_COMMUNITY): Payer: Self-pay | Admitting: Emergency Medicine

## 2013-11-04 ENCOUNTER — Emergency Department (HOSPITAL_COMMUNITY)
Admission: EM | Admit: 2013-11-04 | Discharge: 2013-11-04 | Disposition: A | Discharge status: Home / Self Care | Payer: PRIVATE HEALTH INSURANCE | Source: Home / Self Care | Attending: Family Medicine | Admitting: Family Medicine

## 2013-11-04 DIAGNOSIS — M5412 Radiculopathy, cervical region: Secondary | ICD-10-CM

## 2013-11-04 MED ORDER — GABAPENTIN 300 MG PO CAPS: 300.0000 mg | ORAL_CAPSULE | Freq: Every day | ORAL | Status: DC

## 2013-11-04 MED ORDER — MELOXICAM 15 MG PO TABS: 15.0000 mg | ORAL_TABLET | Freq: Every day | ORAL | Status: DC

## 2013-11-04 NOTE — ED Notes (Signed)
States has been doing a lot of lifting lately; started with right shoulder pain approx 3-4 wks ago - was improving with massage and chiropractor.  Over past 3 days has become significantly worse.  C/O difficulty lifting RUE due to pain; c/o numbness radiating down into fingers.  Difficulty sleeping.  Has been using Tyl, ice, and heat.

## 2013-11-04 NOTE — Discharge Instructions (Signed)
Ms. [name],   Finger the pain you're having is due to a pinched nerve in her neck. I will prescribe a daily anti-inflammatory to take. I will also prescribe a medication called gabapentin which will decrease the sensitivity of that nerve. Is important to followup with sports medicine in the near future because I think you need imaging of your neck.  Sincerely,   Dr. Clinton SawyerWilliamson

## 2013-11-04 NOTE — ED Provider Notes (Signed)
CSN: [id number]     Arrival date & time 11/04/13  1103 History   First MD Initiated Contact with Patient 11/04/13 1202     Chief Complaint  Patient presents with  . Shoulder Pain   (Consider location/radiation/quality/duration/timing/severity/associated sxs/prior Treatment) HPI  58 year old F with shoulder pain, neck and arm pain. She has chronic issues with neck pain and states she's been diagnosed with cervical spondylosis before. She routinely goes to a chiropractor and received massage therapy. Over the past 3-4 weeks however she's had worsening pain with shooting numbness and tingling down her right arm all the way to her hand and fingers. She denies weakness of the upper services is hard to certain tasks including driving with her right arm. She has tried ibuprofen intermittently for this pain. Otherwise she is not taking any medications.  Denies any recent trauma. No history of surgery. No MRI.    Past Medical History  Diagnosis Date  . Acid reflux   . Hypothyroid   . HSV-1 (herpes simplex virus 1) infection   . BV (bacterial vaginosis)   . Apocrine metaplasia of breast   . Obese   . Hyperlipidemia   . Endometriosis     h/o  . Depression     h/o  . Asthma    Past Surgical History  Procedure Laterality Date  . Abdominal hysterectomy    . Wisdom tooth extraction    . Tubal ligation    . Cesarean section      x3  . Sinus endo w/fusion      x3  . Leg surgery      had non cancerous tumors removed from left leg   Family History  Problem Relation Age of Onset  . Stroke Other   . Asthma Mother   . Mitral valve prolapse Mother   . Thyroid disease Mother   . Heart disease Brother   . Mitral valve prolapse Brother   . Cancer Maternal Aunt     uterine and liver and kidney  . Cancer Maternal Uncle   . Emphysema Maternal Uncle   . Diabetes Maternal Grandmother   . Heart disease Maternal Grandmother   . Thyroid disease Maternal Grandmother   . Diabetes Paternal  Grandmother   . Diabetes Paternal Grandfather    History  Substance Use Topics  . Smoking status: Never Smoker   . Smokeless tobacco: Never Used  . Alcohol Use: No   OB History   Grav Para Term Preterm Abortions TAB SAB Ect Mult Living   3 3        3      Review of Systems Negative for chest pain, shortness of breath, abdominal pain  Allergies  Erythromycin; Amoxicillin-pot clavulanate; and Darvocet  Home Medications   Current Outpatient Rx  Name  Route  Sig  Dispense  Refill  . atorvastatin (LIPITOR) 20 MG tablet   Oral   Take 20 mg by mouth daily.         . calcium-vitamin D (OSCAL WITH D) 500-200 MG-UNIT per tablet   Oral   Take 1 tablet by mouth daily.           . cyproheptadine (PERIACTIN) 4 MG tablet   Oral   Take 4 mg by mouth at bedtime.          Marland Kitchen dexlansoprazole (DEXILANT) 60 MG capsule   Oral   Take 60 mg by mouth daily.           Marland Kitchen  estrogens, conjugated, (PREMARIN) 0.9 MG tablet   Oral   Take 1 tablet (0.9 mg total) by mouth daily.   30 tablet   11   . levothyroxine (SYNTHROID, LEVOTHROID) 75 MCG tablet   Oral   Take 75 mcg by mouth daily.           . mometasone (ASMANEX) 220 MCG/INH inhaler   Inhalation   Inhale 2 puffs into the lungs daily.         . Multiple Vitamins-Minerals (MULTIVITAMINS THER. W/MINERALS) TABS   Oral   Take 1 tablet by mouth daily.           Marland Kitchen. triamcinolone (NASACORT AQ) 55 MCG/ACT nasal inhaler   Nasal   Place 2 sprays into the nose daily.         . vitamin B-12 (CYANOCOBALAMIN) 1000 MCG tablet   Oral   Take 1,000 mcg by mouth daily.           Marland Kitchen. gabapentin (NEURONTIN) 300 MG capsule   Oral   Take 1 capsule (300 mg total) by mouth at bedtime.   30 capsule   1   . meloxicam (MOBIC) 15 MG tablet   Oral   Take 1 tablet (15 mg total) by mouth daily.   30 tablet   0    BP 135/89  Pulse 85  Temp(Src) 98.3 F (36.8 C) (Oral)  Resp 18  SpO2 96% Physical Exam Gen: middle-aged white female,  nondistressed, pleasant and conversant Cervical spine: normal alignment, no tenderness of spinous processes, normal range of motion, equivocal Spurling's  Shoulder Exam - right Inspection: Normal appearance ROM/Strength:  Abduction (Suprapsinatus): normal  Internal Rotation/Liftoff (Subsapularis):normal  External Rotation (Infraspinatus/Teres Minor): normal Maneuvers:   Neer's: Negative  Hawkin's: Negative  Empty Can/Jobes for supraspinatus: Negative  Drop Arm: N/A Obrien's test for labrum tear: N/A Speed's: N/A Yergenson's: N/A  Neurologic: 5/5 strength in upper extremities bilaterally; Decreased sensation in the distribution of C6 dermatome; 2+ biceps and brachioradialis reflexes; total strength of wrist extension wrist flexion     ED Course  Procedures (including critical care time) Labs Review Labs Reviewed - No data to display Imaging Review No results found.   MDM   1. Cervical radiculopathy at C6    This is a chronic issue with worsening symptoms of pain, but no evidence of weakness or neurologic compromise. Treat symptomatically with anti-inflammatory gabapentin. Encouraged followup with sports medicine for further evaluation.    Garnetta BuddyEdward V Thelda Gagan, MD 11/04/13 1245

## 2013-11-07 NOTE — ED Provider Notes (Signed)
Medical screening examination/treatment/procedure(s) were performed by a resident physician or non-physician practitioner and as the supervising physician I was immediately available for consultation/collaboration.  Ha Shannahan, MD    Borna Wessinger S Yoseline Andersson, MD 11/07/13 0750 

## 2014-03-17 ENCOUNTER — Emergency Department (INDEPENDENT_AMBULATORY_CARE_PROVIDER_SITE_OTHER)
Admission: EM | Admit: 2014-03-17 | Discharge: 2014-03-17 | Disposition: A | Discharge status: Home / Self Care | Payer: PRIVATE HEALTH INSURANCE | Source: Home / Self Care | Attending: Emergency Medicine | Admitting: Emergency Medicine

## 2014-03-17 ENCOUNTER — Encounter (HOSPITAL_COMMUNITY): Payer: Self-pay | Admitting: Emergency Medicine

## 2014-03-17 DIAGNOSIS — K2970 Gastritis, unspecified, without bleeding: Secondary | ICD-10-CM

## 2014-03-17 DIAGNOSIS — K2990 Gastroduodenitis, unspecified, without bleeding: Secondary | ICD-10-CM

## 2014-03-17 LAB — COMPREHENSIVE METABOLIC PANEL
ALBUMIN: 3.6 g/dL (ref 3.5–5.2)
ALT: 11 U/L (ref 0–35)
AST: 12 U/L (ref 0–37)
Alkaline Phosphatase: 73 U/L (ref 39–117)
Anion gap: 12 (ref 5–15)
BILIRUBIN TOTAL: 0.4 mg/dL (ref 0.3–1.2)
BUN: 17 mg/dL (ref 6–23)
CHLORIDE: 105 meq/L (ref 96–112)
CO2: 25 mEq/L (ref 19–32)
CREATININE: 0.89 mg/dL (ref 0.50–1.10)
Calcium: 8.9 mg/dL (ref 8.4–10.5)
GFR calc Af Amer: 81 mL/min — ABNORMAL LOW (ref >90)
GFR calc non Af Amer: 70 mL/min — ABNORMAL LOW (ref >90)
Glucose, Bld: 104 mg/dL — ABNORMAL HIGH (ref 70–99)
Potassium: 4.2 mEq/L (ref 3.7–5.3)
SODIUM: 142 meq/L (ref 137–147)
Total Protein: 7 g/dL (ref 6.0–8.3)

## 2014-03-17 LAB — LIPASE, BLOOD: LIPASE: 23 U/L (ref 11–59)

## 2014-03-17 LAB — TROPONIN I

## 2014-03-17 MED ORDER — RANITIDINE HCL 150 MG PO CAPS: 150.0000 mg | ORAL_CAPSULE | Freq: Two times a day (BID) | ORAL | Status: DC

## 2014-03-17 MED ORDER — SUCRALFATE 1 GM/10ML PO SUSP: 1.0000 g | Freq: Three times a day (TID) | ORAL | Status: DC

## 2014-03-17 MED ORDER — GI COCKTAIL ~~LOC~~
30.0000 mL | Freq: Once | ORAL | Status: AC
  Administered 2014-03-17: 30 mL via ORAL

## 2014-03-17 MED ORDER — GI COCKTAIL ~~LOC~~
ORAL | Status: AC
  Filled 2014-03-17: qty 30

## 2014-03-17 NOTE — ED Notes (Signed)
Pt  Reports  Epigastric  Pain        Severe     X  6  Hours  -  She   Reports  A  History  Of  gerd  In  Past       No  Vomiting       No  Diarrhea   However  Reports  Some  Nausea     Clawing  At  Epigastric  Area  With a  Furred   [employer]

## 2014-03-17 NOTE — ED Provider Notes (Signed)
CSN: [id number]     Arrival date & time 03/17/14  4098 History   First MD Initiated Contact with Patient 03/17/14 [phone]     Chief Complaint  Patient presents with  . Abdominal Pain   (Consider location/radiation/quality/duration/timing/severity/associated sxs/prior Treatment) HPI She is a 58 year old woman here with her mother for evaluation of epigastric pain. She states it woke her from sleep at about 3 AM. It radiates around the left side to her back. It also goes up her central chest a little bit. She denies any nausea, vomiting, diarrhea. She has been sipping on ginger ale this morning without difficulty. She denies any chest pain, shortness of breath, diaphoresis. She has a history of GERD for which she takes medication. Denies any history of gallbladder or pancreas issues. She tried taking a Zofran this morning, but it did not help.  Past Medical History  Diagnosis Date  . Acid reflux   . Hypothyroid   . HSV-1 (herpes simplex virus 1) infection   . BV (bacterial vaginosis)   . Apocrine metaplasia of breast   . Obese   . Hyperlipidemia   . Endometriosis     h/o  . Depression     h/o  . Asthma    Past Surgical History  Procedure Laterality Date  . Abdominal hysterectomy    . Wisdom tooth extraction    . Tubal ligation    . Cesarean section      x3  . Sinus endo w/fusion      x3  . Leg surgery      had non cancerous tumors removed from left leg   Family History  Problem Relation Age of Onset  . Stroke Other   . Asthma Mother   . Mitral valve prolapse Mother   . Thyroid disease Mother   . Heart disease Brother   . Mitral valve prolapse Brother   . Cancer Maternal Aunt     uterine and liver and kidney  . Cancer Maternal Uncle   . Emphysema Maternal Uncle   . Diabetes Maternal Grandmother   . Heart disease Maternal Grandmother   . Thyroid disease Maternal Grandmother   . Diabetes Paternal Grandmother   . Diabetes Paternal Grandfather    History  Substance Use  Topics  . Smoking status: Never Smoker   . Smokeless tobacco: Never Used  . Alcohol Use: No   OB History   Grav Para Term Preterm Abortions TAB SAB Ect Mult Living   3 3        3      Review of Systems  Constitutional: Negative.   HENT: Negative.   Respiratory: Negative.   Cardiovascular: Negative.   Gastrointestinal: Positive for abdominal pain (epigastric). Negative for nausea, vomiting, diarrhea and constipation.  Genitourinary: Positive for frequency. Negative for dysuria and menstrual problem.  Musculoskeletal: Negative.   Skin: Negative.   Neurological: Negative.     Allergies  Erythromycin; Amoxicillin-pot clavulanate; and Darvocet  Home Medications   Prior to Admission medications   Medication Sig Start Date End Date Taking? Authorizing Provider  atorvastatin (LIPITOR) 20 MG tablet Take 20 mg by mouth daily.    Historical Provider, MD  calcium-vitamin D (OSCAL WITH D) 500-200 MG-UNIT per tablet Take 1 tablet by mouth daily.      Historical Provider, MD  cyproheptadine (PERIACTIN) 4 MG tablet Take 4 mg by mouth at bedtime.     Historical Provider, MD  dexlansoprazole (DEXILANT) 60 MG capsule Take 60 mg by mouth daily.  Historical Provider, MD  estrogens, conjugated, (PREMARIN) 0.9 MG tablet Take 1 tablet (0.9 mg total) by mouth daily. 10/03/12   Henreitta LeberElmira Powell, PA-C  gabapentin (NEURONTIN) 300 MG capsule Take 1 capsule (300 mg total) by mouth at bedtime. 11/04/13   Garnetta BuddyEdward Williamson V, MD  levothyroxine (SYNTHROID, LEVOTHROID) 75 MCG tablet Take 75 mcg by mouth daily.      Historical Provider, MD  meloxicam (MOBIC) 15 MG tablet Take 1 tablet (15 mg total) by mouth daily. 11/04/13   Garnetta BuddyEdward Williamson V, MD  mometasone [facility](ASMANEX) 220 MCG/INH inhaler Inhale 2 puffs into the lungs daily.    Historical Provider, MD  Multiple Vitamins-Minerals (MULTIVITAMINS THER. W/MINERALS) TABS Take 1 tablet by mouth daily.      Historical Provider, MD  ranitidine (ZANTAC) 150 MG capsule Take  1 capsule (150 mg total) by mouth 2 (two) times daily. 03/17/14   Charm RingsErin J Cybill Uriegas, MD  sucralfate (CARAFATE) 1 GM/10ML suspension Take 10 mLs (1 g total) by mouth 4 (four) times daily -  with meals and at bedtime. 03/17/14   Charm RingsErin J Maudine Kluesner, MD  triamcinolone (NASACORT AQ) 55 MCG/ACT nasal inhaler Place 2 sprays into the nose daily.    Historical Provider, MD  vitamin B-12 (CYANOCOBALAMIN) 1000 MCG tablet Take 1,000 mcg by mouth daily.      Historical Provider, MD   BP 131/81  Pulse 79  Temp(Src) 98.2 F (36.8 C) (Oral)  Resp 20  SpO2 97% Physical Exam  Constitutional: She is oriented to person, place, and time. She appears well-developed and well-nourished. She appears distressed.  HENT:  Head: Normocephalic and atraumatic.  Neck: Normal range of motion.  Cardiovascular: Normal rate, regular rhythm, normal heart sounds and intact distal pulses.  Exam reveals no gallop.   No murmur heard. Pulmonary/Chest: Effort normal and breath sounds normal. No respiratory distress. She has no wheezes. She has no rales.  Abdominal: Soft. Bowel sounds are normal. She exhibits no distension and no mass. There is no hepatosplenomegaly. There is tenderness (epigastric and RUQ). There is positive Murphy's sign. There is no rebound and no guarding.  Musculoskeletal: She exhibits no edema.  Neurological: She is alert and oriented to person, place, and time.  Skin: Skin is warm and dry. No rash noted.    ED Course  EKG  Date/Time: 03/17/2014 9:49 AM Performed by: Charm RingsHONIG, Demarious Kapur J Authorized by: Charm RingsHONIG, Jaimie Redditt J Interpreted by ED physician Comparison: compared with previous ECG from 07/17/2011 Similar to previous ECG Rhythm: sinus rhythm Rate: normal QRS axis: left Conduction: conduction normal ST Segments: ST segments normal T Waves: T waves normal Clinical impression: abnormal ECG Comments: Left axis deviation, otherwise normal.   (including critical care time) Labs Review Labs Reviewed  COMPREHENSIVE  METABOLIC PANEL - Abnormal; Notable for the following:    Glucose, Bld 104 (*)    GFR calc non Af Amer 70 (*)    GFR calc Af Amer 81 (*)    All other components within normal limits  TROPONIN I  LIPASE, BLOOD    Imaging Review No results found.   MDM   1. Gastritis    EKG reviewed and negative for acute MI. Will check CMP, lipase, troponin. GI cocktail to be given.  She reports some improvement in her pain with the GI cocktail.  Labwork reviewed and within normal limits. No evidence for pancreatitis or gallbladder disease. I suspect she has gastritis or possibly an early ulcer. Vital signs are stable, she is not vomiting. Will add  ranitidine and Carafate. Reviewed warning signs as in after visit summary. Followup with her PCP within the next week. If symptoms are not improving with medication, I provided Dr. Haywood Pao contact information.  Charm Rings, MD 03/17/14 [phone]

## 2014-03-17 NOTE — Discharge Instructions (Signed)
I think your reflux is getting worse and causing gastritis or maybe even an early ulcer. Keep taking the acid blocker you are already on. Add ranitidine 1 pill twice a day. Add carafate 10mL 4 times a day. Do NOT take any NSAIDs - ibuprofen, aleve, advil, motrin, naprosyn.  Follow up with your regular doctor in 1 week. I have provided the contact information for Dr. Elnoria HowardHung (a GI doctor).  Follow up with him if things are not improving with the new medicines. If you develop fevers, the pain is getting worse, vomiting, blood in vomit or stool, or vomit that looks like coffee grounds please go straight to the emergency room.

## 2014-04-17 ENCOUNTER — Encounter: Payer: Self-pay | Admitting: Gastroenterology

## 2014-05-14 ENCOUNTER — Other Ambulatory Visit (INDEPENDENT_AMBULATORY_CARE_PROVIDER_SITE_OTHER): Payer: Commercial Managed Care - PPO

## 2014-05-14 ENCOUNTER — Ambulatory Visit (INDEPENDENT_AMBULATORY_CARE_PROVIDER_SITE_OTHER): Payer: Commercial Managed Care - PPO | Admitting: Physician Assistant

## 2014-05-14 ENCOUNTER — Encounter: Payer: Self-pay | Admitting: Physician Assistant

## 2014-05-14 VITALS — BP 136/78 | HR 72 | Ht 66.0 in | Wt 202.6 lb

## 2014-05-14 DIAGNOSIS — R110 Nausea: Secondary | ICD-10-CM | POA: Diagnosis not present

## 2014-05-14 DIAGNOSIS — R1011 Right upper quadrant pain: Secondary | ICD-10-CM | POA: Insufficient documentation

## 2014-05-14 DIAGNOSIS — R1013 Epigastric pain: Secondary | ICD-10-CM | POA: Diagnosis not present

## 2014-05-14 DIAGNOSIS — Z9049 Acquired absence of other specified parts of digestive tract: Secondary | ICD-10-CM | POA: Insufficient documentation

## 2014-05-14 DIAGNOSIS — J45909 Unspecified asthma, uncomplicated: Secondary | ICD-10-CM | POA: Insufficient documentation

## 2014-05-14 DIAGNOSIS — E039 Hypothyroidism, unspecified: Secondary | ICD-10-CM | POA: Insufficient documentation

## 2014-05-14 DIAGNOSIS — K219 Gastro-esophageal reflux disease without esophagitis: Secondary | ICD-10-CM | POA: Insufficient documentation

## 2014-05-14 LAB — CBC WITH DIFFERENTIAL/PLATELET
Basophils Absolute: 0 10*3/uL (ref 0.0–0.1)
Basophils Relative: 0.3 % (ref 0.0–3.0)
EOS PCT: 1.7 % (ref 0.0–5.0)
Eosinophils Absolute: 0.1 10*3/uL (ref 0.0–0.7)
HEMATOCRIT: 36.7 % (ref 36.0–46.0)
HEMOGLOBIN: 12.7 g/dL (ref 12.0–15.0)
LYMPHS ABS: 2.5 10*3/uL (ref 0.7–4.0)
Lymphocytes Relative: 30.2 % (ref 12.0–46.0)
MCHC: 34.6 g/dL (ref 30.0–36.0)
MCV: 84.6 fl (ref 78.0–100.0)
MONOS PCT: 5.5 % (ref 3.0–12.0)
Monocytes Absolute: 0.5 10*3/uL (ref 0.1–1.0)
NEUTROS ABS: 5.1 10*3/uL (ref 1.4–7.7)
Neutrophils Relative %: 62.3 % (ref 43.0–77.0)
Platelets: 229 10*3/uL (ref 150.0–400.0)
RBC: 4.34 Mil/uL (ref 3.87–5.11)
RDW: 12.6 % (ref 11.5–15.5)
WBC: 8.3 10*3/uL (ref 4.0–10.5)

## 2014-05-14 LAB — HEPATIC FUNCTION PANEL
ALBUMIN: 3.6 g/dL (ref 3.5–5.2)
ALT: 14 U/L (ref 0–35)
AST: 19 U/L (ref 0–37)
Alkaline Phosphatase: 59 U/L (ref 39–117)
Bilirubin, Direct: 0.1 mg/dL (ref 0.0–0.3)
TOTAL PROTEIN: 7.3 g/dL (ref 6.0–8.3)
Total Bilirubin: 0.5 mg/dL (ref 0.2–1.2)

## 2014-05-14 MED ORDER — OMEPRAZOLE 40 MG PO CPDR: DELAYED_RELEASE_CAPSULE | ORAL | Status: DC

## 2014-05-14 NOTE — Progress Notes (Signed)
Subjective:    Patient ID: [name], female    DOB: [birth year], 58 y.o.   MRN: [id number]  HPI   [name] is a pleasant 58 year old white female who was recently seen at urgent care and asked to follow up with her gastroenterologist. She is known remotely to Dr. Russella Dar from colonoscopy done in June of 2007 this was done for routine screening and showed sigmoid colon diverticulosis otherwise negative exam. Other medical problems include GERD, depression, and hypothyroidism as well as asthma. Patient states that she's been having episodes of severe epigastric and right-sided abdominal pain which radiated around into her back. She says at times these are very bad in cause her to crawl up in a ball. They are generally last for several hours and are associated with nausea but no vomiting. She says she's had 4-5 episodes over the past couple of months. In between these episodes she has some ongoing upper abdominal discomfort which is not severe. She's been eating a very bland diet and does not feel that eating necessarily aggravates her discomfort. Her appetite has been okay her weight has been stable no fevers or chills. He is having some loose stools especially in association with the episodes of pain. After being seen at urgent care she had been given a prescription for Zantac 150 twice a day and a short course of Carafate. She's not sure that either has made much difference. Is not on any regular aspirin or NSAIDs. Labs done 03/17/2014 LFTs were normal lipase 23 . All     .Review of Systems  Constitutional: Negative.   HENT: Negative.   Eyes: Negative.   Respiratory: Negative.   Gastrointestinal: Positive for nausea and abdominal pain.  Endocrine: Negative.   Genitourinary: Negative.   Musculoskeletal: Negative.   Skin: Negative.   Allergic/Immunologic: Negative.   Neurological: Negative.   Hematological: Negative.   Psychiatric/Behavioral: The patient is nervous/anxious.     Outpatient Prescriptions Prior to Visit  Medication Sig Dispense Refill  . atorvastatin (LIPITOR) 20 MG tablet Take 20 mg by mouth daily.      . calcium-vitamin D (OSCAL WITH D) 500-200 MG-UNIT per tablet Take 1 tablet by mouth daily.        . cyproheptadine (PERIACTIN) 4 MG tablet Take 4 mg by mouth at bedtime.       Marland Kitchen estrogens, conjugated, (PREMARIN) 0.9 MG tablet Take 1 tablet (0.9 mg total) by mouth daily.  30 tablet  11  . levothyroxine (SYNTHROID, LEVOTHROID) 75 MCG tablet Take 75 mcg by mouth daily.        . mometasone (ASMANEX) 220 MCG/INH inhaler Inhale 2 puffs into the lungs daily.      . Multiple Vitamins-Minerals (MULTIVITAMINS THER. W/MINERALS) TABS Take 1 tablet by mouth daily.        . ranitidine (ZANTAC) 150 MG capsule Take 1 capsule (150 mg total) by mouth 2 (two) times daily.  60 capsule  0  . sucralfate (CARAFATE) 1 GM/10ML suspension Take 10 mLs (1 g total) by mouth 4 (four) times daily -  with meals and at bedtime.  420 mL  0  . triamcinolone (NASACORT AQ) 55 MCG/ACT nasal inhaler Place 2 sprays into the nose daily.      . vitamin B-12 (CYANOCOBALAMIN) 1000 MCG tablet Take 1,000 mcg by mouth daily.        Marland Kitchen dexlansoprazole (DEXILANT) 60 MG capsule Take 60 mg by mouth daily.        Marland Kitchen gabapentin (NEURONTIN) 300  MG capsule Take 1 capsule (300 mg total) by mouth at bedtime.  30 capsule  1  . meloxicam (MOBIC) 15 MG tablet Take 1 tablet (15 mg total) by mouth daily.  30 tablet  0   No facility-administered medications prior to visit.   Allergies  Allergen Reactions  . Erythromycin     Abd pain  . Amoxicillin-Pot Clavulanate Nausea And Vomiting  . Darvocet [Propoxyphene N-Acetaminophen] Nausea And Vomiting   Patient Active Problem List   Diagnosis Date Noted  . RUQ abdominal pain 05/14/2014  . Abdominal pain, epigastric 05/14/2014  . Nausea without vomiting 05/14/2014  . Hypothyroidism 05/14/2014  . GERD (gastroesophageal reflux disease) 05/14/2014  . Asthma,  chronic 05/14/2014  . S/P cholecystectomy 05/14/2014   History  Substance Use Topics  . Smoking status: Never Smoker   . Smokeless tobacco: Never Used  . Alcohol Use: Yes     Comment: socially   family history includes Asthma in her mother; Cancer in her maternal aunt and maternal uncle; Diabetes in her maternal grandmother, paternal grandfather, and paternal grandmother; Emphysema in her maternal uncle; Heart disease in her brother and maternal grandmother; Mitral valve prolapse in her brother and mother; Stroke in her other; Thyroid disease in her maternal grandmother and mother.     Objective:   Physical Exam  well-developed white female in no acute distress, pleasant blood pressure 136/78 pulse 72 height 5 foot 6 weight 202. HEENT; nontraumatic normocephalic EOMI PERRLA sclera anicteric, Supple; no JVD, Cardiovascular; regular rate and rhythm with S1-S2 no murmur or gallop, Pulmonary ;clear bilaterally, Abdomen; soft she is mildly tender in the right upper quadrant is no guarding or rebound no palpable mass or hepatosplenomegaly bowel sounds are present, Rectal; exam not done, Extremities; no clubbing cyanosis or edema skin warm and dry, Psych; mood and affect appropriate         Assessment & Plan:   #1  58 year  old female with 2 month history of episodic epigastric and right upper quadrant pain radiating into the back and associated with nausea. These episodes have been intense at times and last for hours. In between episodes she has had an ongoing epigastric and right upper quadrant low-level discomfort. Symptoms are concerning for biliary colic, an underlying gallbladder disease, also consider other intra-abdominal inflammatory process is, duodenitis or ulcer disease. #2Asthma #3 Hypothyroidism #4 diverticulosis-up to date on colon screening last colonoscopy 2007  Plan; stop Zantac and switch to Prilosec 20 mg by mouth twice daily Check CBC with differential and hepatic  panel Schedule upper abdominal ultrasound Schedule for upper endoscopy with Dr. Russella DarStark. Procedure discussed in detail with the patient she is agreeable to proceed To continue a bland diet for now

## 2014-05-14 NOTE — Progress Notes (Signed)
Reviewed and agree with management plan.  Malcolm T. Stark, MD FACG 

## 2014-05-14 NOTE — Patient Instructions (Addendum)
Please go to the basement level to have your labs drawn.  You have been scheduled for an endoscopy. Please follow written instructions given to you at your visit today. If you use inhalers (even only as needed), please bring them with you on the day of your procedure. Your physician has requested that you go to www.startemmi.com and enter the access code given to you at your visit today. This web site gives a general overview about your procedure. However, you should still follow specific instructions given to you by our office regarding your preparation for the procedure.  You have been [facility] of [facility]) on 05-17-2014  Friday at 7:30 am . Please arrive at 7:15 am  prior to your appointment for registration. Make certain not to have anything to eat or drink 6 hours prior to your appointment. Should you need to reschedule your appointment, please contact radiology at [phone]. This test typically takes about 30 minutes to perform.

## 2014-05-17 ENCOUNTER — Ambulatory Visit (HOSPITAL_COMMUNITY)
Admission: RE | Admit: 2014-05-17 | Discharge: 2014-05-17 | Disposition: A | Discharge status: Home / Self Care | Payer: Commercial Managed Care - PPO | Source: Ambulatory Visit | Attending: Physician Assistant | Admitting: Physician Assistant

## 2014-05-17 DIAGNOSIS — R110 Nausea: Secondary | ICD-10-CM | POA: Diagnosis not present

## 2014-05-17 DIAGNOSIS — R1013 Epigastric pain: Secondary | ICD-10-CM | POA: Insufficient documentation

## 2014-05-17 DIAGNOSIS — R1011 Right upper quadrant pain: Secondary | ICD-10-CM | POA: Diagnosis not present

## 2014-05-28 ENCOUNTER — Encounter: Payer: Self-pay | Admitting: Gastroenterology

## 2014-06-10 ENCOUNTER — Encounter: Payer: Self-pay | Admitting: Physician Assistant

## 2014-06-11 ENCOUNTER — Encounter: Payer: Self-pay | Admitting: Gastroenterology

## 2014-06-11 ENCOUNTER — Ambulatory Visit (AMBULATORY_SURGERY_CENTER): Payer: Commercial Managed Care - PPO | Admitting: Gastroenterology

## 2014-06-11 VITALS — BP 130/79 | HR 64 | Temp 98.0°F | Resp 12 | Ht 66.0 in | Wt 202.0 lb

## 2014-06-11 DIAGNOSIS — R1011 Right upper quadrant pain: Secondary | ICD-10-CM | POA: Diagnosis not present

## 2014-06-11 DIAGNOSIS — R1013 Epigastric pain: Secondary | ICD-10-CM

## 2014-06-11 DIAGNOSIS — R110 Nausea: Secondary | ICD-10-CM | POA: Diagnosis not present

## 2014-06-11 MED ORDER — SODIUM CHLORIDE 0.9 % IV SOLN: 500.0000 mL | INTRAVENOUS | Status: DC

## 2014-06-11 NOTE — Progress Notes (Signed)
Patient awakening,vss,report to rn 

## 2014-06-11 NOTE — Op Note (Signed)
 [facility] [address] [place], [id number]   ENDOSCOPY PROCEDURE REPORT  PATIENT: [name], [name]  MR#: [id number] BIRTHDATE: 1955/09/16 , 58  yrs. old GENDER: female ENDOSCOPIST: Meryl DareMalcolm T [place] Sloop, MD, [facility] PROCEDURE DATE:  06/11/2014 PROCEDURE:  EGD, diagnostic ASA CLASS:     Class II INDICATIONS:  nausea, vomiting, epigastric abdominal pain, and abdominal pain in the upper right quadrant. MEDICATIONS: Monitored anesthesia care and Propofol 200 mg IV TOPICAL ANESTHETIC: none DESCRIPTION OF PROCEDURE: After the risks benefits and alternatives of the procedure were thoroughly explained, informed consent was obtained.  The LB [id number] endoscope was introduced through the mouth and advanced to the second portion of the duodenum , Without limitations.  The instrument was slowly withdrawn as the mucosa was fully examined.    EXAM: The esophagus and gastroesophageal junction were completely normal in appearance.  The stomach was entered and closely examined. The antrum, angularis, and lesser curvature were well visualized, including a retroflexed view of the cardia and fundus. The stomach wall was normally distensable.  The scope passed easily through the pylorus into the duodenum.  Retroflexed views revealed no abnormalities.     The scope was then withdrawn from the patient and the procedure completed.  COMPLICATIONS: There were no immediate complications.  ENDOSCOPIC IMPRESSION: 1.  Normal appearing EGD  RECOMMENDATIONS: 1.  Anti-reflux regimen 2.  Continue PPI 3.  Follow-up appointment with your primary MD as planned  eSigned:  Meryl DareMalcolm T Verne Cove, MD, [facility] 06/11/2014 3:08 PM   FA:OZHYQMCC:Robert Nicholos Johnseade, MD

## 2014-06-11 NOTE — Patient Instructions (Signed)
YOU HAD AN ENDOSCOPIC PROCEDURE TODAY AT THE [facility]: Refer to the procedure report that was given to you for any specific questions about what was found during the examination.  If the procedure report does not answer your questions, please call your gastroenterologist to clarify.  If you requested that your care partner not be given the details of your procedure findings, then the procedure report has been included in a sealed envelope for you to review at your convenience later.  YOU SHOULD EXPECT: Some feelings of bloating in the abdomen. Passage of more gas than usual.  Walking can help get rid of the air that was put into your GI tract during the procedure and reduce the bloating. If you had a lower endoscopy (such as a colonoscopy or flexible sigmoidoscopy) you may notice spotting of blood in your stool or on the toilet paper. If you underwent a bowel prep for your procedure, then you may not have a normal bowel movement for a few days.  DIET: Your first meal following the procedure should be a light meal and then it is ok to progress to your normal diet.  A half-sandwich or bowl of soup is an example of a good first meal.  Heavy or fried foods are harder to digest and may make you feel nauseous or bloated.  Likewise meals heavy in dairy and vegetables can cause extra gas to form and this can also increase the bloating.  Drink plenty of fluids but you should avoid alcoholic beverages for 24 hours.  ACTIVITY: Your care partner should take you home directly after the procedure.  You should plan to take it easy, moving slowly for the rest of the day.  You can resume normal activity the day after the procedure however you should NOT DRIVE or use heavy machinery for 24 hours (because of the sedation medicines used during the test).    SYMPTOMS TO REPORT IMMEDIATELY: A gastroenterologist can be reached at any hour.  During normal business hours, 8:30 AM to 5:00 PM Monday through Friday,  call [phone].  After hours and on weekends, please call the GI answering service at [phone] who will take a message and have the physician on call contact you.     Following upper endoscopy (EGD)  Vomiting of blood or coffee ground material  New chest pain or pain under the shoulder blades  Painful or persistently difficult swallowing  New shortness of breath  Fever of 100F or higher  Black, tarry-looking stools  FOLLOW UP: If any biopsies were taken you will be contacted by phone or by letter within the next 1-3 weeks.  Call your gastroenterologist if you have not heard about the biopsies in 3 weeks.   Normal endoscopy  Reflux regimen information given. Our staff will call the home number listed on your records the next business day following your procedure to check on you and address any questions or concerns that you may have at that time regarding the information given to you following your procedure. This is a courtesy call and so if there is no answer at the home number and we have not heard from you through the emergency physician on call, we will assume that you have returned to your regular daily activities without incident.  SIGNATURES/CONFIDENTIALITY: You and/or your care partner have signed paperwork which will be entered into your electronic medical record.  These signatures attest to the fact that that the information above on your After Visit  Summary has been reviewed and is understood.  Full responsibility of the confidentiality of this discharge information lies with you and/or your care-partner. 

## 2014-06-12 ENCOUNTER — Telehealth: Payer: Self-pay | Admitting: *Deleted

## 2014-06-12 NOTE — Telephone Encounter (Signed)
  Follow up Call-  Call back number 06/11/2014  Post procedure Call Back phone  # [phone]  Permission to leave phone message Yes     Patient questions:  Do you have a fever, pain , or abdominal swelling? No. Pain Score  0 *  Have you tolerated food without any problems? Yes.    Have you been able to return to your normal activities? Yes.    Do you have any questions about your discharge instructions: Diet   No. Medications  No. Follow up visit  No.  Do you have questions or concerns about your Care? No.  Actions: * If pain score is 4 or above: No action needed, pain <4.

## 2014-06-17 ENCOUNTER — Ambulatory Visit: Payer: PRIVATE HEALTH INSURANCE | Admitting: Gastroenterology

## 2014-09-27 ENCOUNTER — Other Ambulatory Visit: Payer: Self-pay

## 2014-09-27 DIAGNOSIS — Z1231 Encounter for screening mammogram for malignant neoplasm of breast: Secondary | ICD-10-CM

## 2014-10-17 ENCOUNTER — Ambulatory Visit
Admission: RE | Admit: 2014-10-17 | Discharge: 2014-10-17 | Disposition: A | Discharge status: Home / Self Care | Payer: Commercial Managed Care - PPO | Source: Ambulatory Visit

## 2014-10-17 DIAGNOSIS — Z1231 Encounter for screening mammogram for malignant neoplasm of breast: Secondary | ICD-10-CM

## 2015-07-25 ENCOUNTER — Ambulatory Visit
Admission: RE | Admit: 2015-07-25 | Discharge: 2015-07-25 | Disposition: A | Discharge status: Home / Self Care | Payer: Commercial Managed Care - PPO | Source: Ambulatory Visit | Attending: Family Medicine | Admitting: Family Medicine

## 2015-07-25 ENCOUNTER — Other Ambulatory Visit: Payer: Self-pay | Admitting: Family Medicine

## 2015-07-25 DIAGNOSIS — M549 Dorsalgia, unspecified: Secondary | ICD-10-CM

## 2015-08-24 ENCOUNTER — Encounter (HOSPITAL_COMMUNITY): Payer: Self-pay | Admitting: Emergency Medicine

## 2015-08-24 ENCOUNTER — Emergency Department (HOSPITAL_COMMUNITY)
Admission: EM | Admit: 2015-08-24 | Discharge: 2015-08-24 | Disposition: A | Discharge status: Home / Self Care | Payer: Commercial Managed Care - PPO | Attending: Emergency Medicine | Admitting: Emergency Medicine

## 2015-08-24 ENCOUNTER — Emergency Department (HOSPITAL_COMMUNITY): Payer: Commercial Managed Care - PPO

## 2015-08-24 DIAGNOSIS — R109 Unspecified abdominal pain: Secondary | ICD-10-CM | POA: Diagnosis not present

## 2015-08-24 DIAGNOSIS — J45909 Unspecified asthma, uncomplicated: Secondary | ICD-10-CM | POA: Diagnosis not present

## 2015-08-24 DIAGNOSIS — E669 Obesity, unspecified: Secondary | ICD-10-CM | POA: Insufficient documentation

## 2015-08-24 DIAGNOSIS — K219 Gastro-esophageal reflux disease without esophagitis: Secondary | ICD-10-CM | POA: Diagnosis not present

## 2015-08-24 DIAGNOSIS — Z8619 Personal history of other infectious and parasitic diseases: Secondary | ICD-10-CM | POA: Insufficient documentation

## 2015-08-24 DIAGNOSIS — Z8659 Personal history of other mental and behavioral disorders: Secondary | ICD-10-CM | POA: Diagnosis not present

## 2015-08-24 DIAGNOSIS — Z79899 Other long term (current) drug therapy: Secondary | ICD-10-CM | POA: Diagnosis not present

## 2015-08-24 DIAGNOSIS — M545 Low back pain: Secondary | ICD-10-CM

## 2015-08-24 DIAGNOSIS — E785 Hyperlipidemia, unspecified: Secondary | ICD-10-CM | POA: Diagnosis not present

## 2015-08-24 DIAGNOSIS — Z880 Allergy status to penicillin: Secondary | ICD-10-CM | POA: Insufficient documentation

## 2015-08-24 DIAGNOSIS — Z8742 Personal history of other diseases of the female genital tract: Secondary | ICD-10-CM | POA: Diagnosis not present

## 2015-08-24 DIAGNOSIS — R200 Anesthesia of skin: Secondary | ICD-10-CM | POA: Insufficient documentation

## 2015-08-24 DIAGNOSIS — Z7951 Long term (current) use of inhaled steroids: Secondary | ICD-10-CM | POA: Diagnosis not present

## 2015-08-24 DIAGNOSIS — E039 Hypothyroidism, unspecified: Secondary | ICD-10-CM | POA: Insufficient documentation

## 2015-08-24 LAB — BASIC METABOLIC PANEL
ANION GAP: 13 (ref 5–15)
BUN: 20 mg/dL (ref 6–20)
CALCIUM: 9.9 mg/dL (ref 8.9–10.3)
CO2: 25 mmol/L (ref 22–32)
Chloride: 105 mmol/L (ref 101–111)
Creatinine, Ser: 0.86 mg/dL (ref 0.44–1.00)
Glucose, Bld: 92 mg/dL (ref 65–99)
Potassium: 4 mmol/L (ref 3.5–5.1)
SODIUM: 143 mmol/L (ref 135–145)

## 2015-08-24 LAB — CBC WITH DIFFERENTIAL/PLATELET
BASOS ABS: 0.1 10*3/uL (ref 0.0–0.1)
BASOS PCT: 1 %
Eosinophils Absolute: 0.1 10*3/uL (ref 0.0–0.7)
Eosinophils Relative: 2 %
HEMATOCRIT: 41.4 % (ref 36.0–46.0)
Hemoglobin: 13.5 g/dL (ref 12.0–15.0)
Lymphocytes Relative: 35 %
Lymphs Abs: 2.5 10*3/uL (ref 0.7–4.0)
MCH: 28.1 pg (ref 26.0–34.0)
MCHC: 32.6 g/dL (ref 30.0–36.0)
MCV: 86.3 fL (ref 78.0–100.0)
MONO ABS: 0.5 10*3/uL (ref 0.1–1.0)
Monocytes Relative: 7 %
NEUTROS ABS: 4 10*3/uL (ref 1.7–7.7)
Neutrophils Relative %: 55 %
PLATELETS: 242 10*3/uL (ref 150–400)
RBC: 4.8 MIL/uL (ref 3.87–5.11)
RDW: 12.7 % (ref 11.5–15.5)
WBC: 7.1 10*3/uL (ref 4.0–10.5)

## 2015-08-24 LAB — URINALYSIS, ROUTINE W REFLEX MICROSCOPIC
Bilirubin Urine: NEGATIVE
Glucose, UA: NEGATIVE mg/dL
Hgb urine dipstick: NEGATIVE
KETONES UR: NEGATIVE mg/dL
LEUKOCYTES UA: NEGATIVE
NITRITE: NEGATIVE
PROTEIN: NEGATIVE mg/dL
Specific Gravity, Urine: 1.004 — ABNORMAL LOW (ref 1.005–1.030)
pH: 6 (ref 5.0–8.0)

## 2015-08-24 MED ORDER — CYCLOBENZAPRINE HCL 5 MG PO TABS
7.5000 mg | ORAL_TABLET | Freq: Once | ORAL | Status: AC
  Administered 2015-08-24: 7.5 mg via ORAL
  Filled 2015-08-24: qty 1.5

## 2015-08-24 MED ORDER — OXYCODONE-ACETAMINOPHEN 5-325 MG PO TABS
1.0000 | ORAL_TABLET | Freq: Once | ORAL | Status: AC
  Administered 2015-08-24: 1 via ORAL
  Filled 2015-08-24: qty 1

## 2015-08-24 MED ORDER — MORPHINE SULFATE (PF) 4 MG/ML IV SOLN
4.0000 mg | Freq: Once | INTRAVENOUS | Status: AC
  Administered 2015-08-24: 4 mg via INTRAVENOUS
  Filled 2015-08-24: qty 1

## 2015-08-24 MED ORDER — METHOCARBAMOL 500 MG PO TABS: 500.0000 mg | ORAL_TABLET | Freq: Three times a day (TID) | ORAL | Status: DC | PRN

## 2015-08-24 MED ORDER — OXYCODONE-ACETAMINOPHEN 5-325 MG PO TABS: 2.0000 | ORAL_TABLET | ORAL | Status: DC | PRN

## 2015-08-24 MED ORDER — NAPROXEN 500 MG PO TABS: 500.0000 mg | ORAL_TABLET | Freq: Two times a day (BID) | ORAL | Status: DC

## 2015-08-24 MED ORDER — KETOROLAC TROMETHAMINE 30 MG/ML IJ SOLN
30.0000 mg | Freq: Once | INTRAMUSCULAR | Status: AC
  Administered 2015-08-24: 30 mg via INTRAVENOUS
  Filled 2015-08-24: qty 1

## 2015-08-24 NOTE — ED Notes (Signed)
Patient transported to CT 

## 2015-08-24 NOTE — ED Notes (Signed)
Pt c/o intermittent flank pain since before christmas.  States she was here and was checked for kidney stones but was told she did not.  Denies dysuria or hematuria.  States the pain got worse since yesterday.  Also c/o numbness to lt side of body since about 0900 this morning.  Equal grip strengths, no slurred speech, no facial droop, good strength in extremities.

## 2015-08-24 NOTE — ED Provider Notes (Signed)
MRI discussed with patient.  Pt states numbness getting "better".  Will Dc with NSAID, MM relaxant, and pain meds. PCP f/U.  Rolland PorterMark Jasemine Nawaz, MD 08/24/15 [phone]

## 2015-08-24 NOTE — Discharge Instructions (Signed)
Medications as prescribed.  Follow-up with your physician if the numbness and pain are not improving.  Return to ER with any new, worse, or changing symptoms

## 2015-08-24 NOTE — ED Notes (Signed)
Patient given ice chips and back from CT

## 2015-08-24 NOTE — ED Provider Notes (Signed)
CSN: [id number]     Arrival date & time 08/24/15  1129 History   First MD Initiated Contact with Patient 08/24/15 1218     Chief Complaint  Patient presents with  . Flank Pain  . Numbness     (Consider location/radiation/quality/duration/timing/severity/associated sxs/prior Treatment) HPI 60 year old female who presents with left sided numbness and low back pain. History of HLD, hypothyroidism, and GERD.States that she developed right flank pain during thanksgiving after lifting a [place] from the oven, that resolved. Began to develop left flank pain in early December for which she was seen by her primary care doctor. She underwent CT abdomen pelvis for evaluation of kidney stone, which showed no acute intra-abdominal or retroperitoneal process. States that her pain is worse with movement, position changes, and ambulation. Denies any recent falls or heavy lifting. Has been symptomatically treating her pain with over-the-counter medications as well as intermittent oxycodone. States that she woke up this morning at 745, and noticed that she had numbness over the left side of her body. States that this is never happened before, and states that this was associated with her back pain. Per triage note says that this occurred at 9 AM, but she is unsure, and states that this may have been present when she awoke from sleep. Denies any associating vision or speech changes, headache, weakness, fevers or chills, bowel or urinary incontinence, or urinary retention. No recent illness.   Past Medical History  Diagnosis Date  . Acid reflux   . Hypothyroid   . HSV-1 (herpes simplex virus 1) infection   . BV (bacterial vaginosis)   . Apocrine metaplasia of breast   . Obese   . Hyperlipidemia   . Endometriosis     h/o  . Depression     h/o  . Asthma    Past Surgical History  Procedure Laterality Date  . Abdominal hysterectomy    . Wisdom tooth extraction    . Tubal ligation    . Cesarean section       x3  . Sinus endo w/fusion      x3  . Leg surgery      had non cancerous tumors removed from left leg   Family History  Problem Relation Age of Onset  . Stroke Other   . Asthma Mother   . Mitral valve prolapse Mother   . Thyroid disease Mother   . Heart disease Brother   . Mitral valve prolapse Brother   . Cancer Maternal Aunt     uterine and liver and kidney  . Cancer Maternal Uncle   . Emphysema Maternal Uncle   . Diabetes Maternal Grandmother   . Heart disease Maternal Grandmother   . Thyroid disease Maternal Grandmother   . Diabetes Paternal Grandmother   . Diabetes Paternal Grandfather   . Colon cancer Neg Hx   . Stomach cancer Neg Hx   . Rectal cancer Neg Hx    Social History  Substance Use Topics  . Smoking status: Never Smoker   . Smokeless tobacco: Never Used  . Alcohol Use: Yes     Comment: socially   OB History    Gravida Para Term Preterm AB TAB SAB Ectopic Multiple Living   3 3        3      Review of Systems 10/14 systems reviewed and are negative other than those stated in the HPI    Allergies  Erythromycin; Amoxicillin-pot clavulanate; and Darvocet  Home Medications  Prior to Admission medications   Medication Sig Start Date End Date Taking? Authorizing Provider  azelastine (ASTELIN) 0.1 % nasal spray Place 1 spray into both nostrils 2 (two) times daily as needed for allergies. Use in each nostril as directed   Yes Historical Provider, MD  calcium-vitamin D (OSCAL WITH D) 500-200 MG-UNIT per tablet Take 1 tablet by mouth daily.     Yes Historical Provider, MD  levothyroxine (SYNTHROID, LEVOTHROID) 88 MCG tablet Take 88 mcg by mouth every morning. Monday-Saturday.   Yes Historical Provider, MD  mometasone [facility]) 220 MCG/INH inhaler Inhale 2 puffs into the lungs daily as needed (shortness of breathe.).    Yes Historical Provider, MD  Multiple Vitamins-Minerals (MULTIVITAMINS THER. W/MINERALS) TABS Take 1 tablet by mouth daily.     Yes Historical  Provider, MD  polyvinyl alcohol (LIQUIFILM TEARS) 1.4 % ophthalmic solution Place 1 drop into both eyes daily as needed for dry eyes.   Yes Historical Provider, MD  pravastatin (PRAVACHOL) 40 MG tablet Take 40 mg by mouth every other day. 07/21/15  Yes Historical Provider, MD  estrogens, conjugated, (PREMARIN) 0.9 MG tablet Take 1 tablet (0.9 mg total) by mouth daily. Patient not taking: Reported on 08/24/2015 10/03/12   Henreitta Leber, PA-C  methocarbamol (ROBAXIN) 500 MG tablet Take 1 tablet (500 mg total) by mouth 3 (three) times daily between meals as needed. 08/24/15   Rolland Porter, MD  naproxen (NAPROSYN) 500 MG tablet Take 1 tablet (500 mg total) by mouth 2 (two) times daily. 08/24/15   Rolland Porter, MD  omeprazole (PRILOSEC) 40 MG capsule Take 1 tab twice daily Patient not taking: Reported on 08/24/2015 05/14/14   Amy S Esterwood, PA-C  oxyCODONE-acetaminophen (PERCOCET/ROXICET) 5-325 MG tablet Take 2 tablets by mouth every 4 (four) hours as needed. 08/24/15   Rolland Porter, MD  sucralfate (CARAFATE) 1 GM/10ML suspension Take 10 mLs (1 g total) by mouth 4 (four) times daily -  with meals and at bedtime. Patient not taking: Reported on 08/24/2015 03/17/14   Charm Rings, MD  triamcinolone (NASACORT AQ) 55 MCG/ACT nasal inhaler Place 2 sprays into the nose daily as needed (allergies).     Historical Provider, MD   BP 128/79 mmHg  Pulse 60  Temp(Src) 98.2 F (36.8 C) (Oral)  Resp 18  SpO2 97% Physical Exam Physical Exam  Nursing note and vitals reviewed. Constitutional: Well developed, well nourished, non-toxic, and in no acute distress Head: Normocephalic and atraumatic.  Mouth/Throat: Oropharynx is clear and moist.  Neck: Normal range of motion. Neck supple.  Cardiovascular: Normal rate and regular rhythm.   Pulmonary/Chest: Effort normal and breath sounds normal.  Abdominal: Soft. There is no tenderness. There is no rebound and no guarding.  Musculoskeletal: Normal range of motion.  Skin: Skin  is warm and dry.  Psychiatric: Cooperative Neurological:  Alert, oriented to person, place, time, and situation. Memory grossly in tact. Fluent speech. No dysarthria or aphasia.  Cranial nerves: VF are full. Fundoscopic exam-unable to get good visualization of the discs. Pupils are symmetric, and reactive to light. EOMI without nystagmus. No gaze deviation. Facial muscles symmetric with activation. Sensation to light touch over face in tact bilaterally. Hearing grossly in tact. Palate elevates symmetrically. Head turn and shoulder shrug are intact. Tongue midline.  Reflexes defered.  Muscle bulk and tone normal. No pronator drift. Moves all extremities symmetrically. Sensation to light touch reported diminished by 10% over left face, arm, and leg Coordination reveals no dysmetria with finger to  nose. Gait is narrow-based and steady. Non-ataxic.   ED Course  Procedures (including critical care time) Labs Review Labs Reviewed  URINALYSIS, ROUTINE W REFLEX MICROSCOPIC (NOT AT [facility]) - Abnormal; Notable for the following:    Specific Gravity, Urine 1.004 (*)    All other components within normal limits  CBC WITH DIFFERENTIAL/PLATELET  BASIC METABOLIC PANEL    Imaging Review Ct Head Wo Contrast  08/24/2015  CLINICAL DATA:  Left-sided body numbness today. No slurred speech or weakness. EXAM: CT HEAD WITHOUT CONTRAST TECHNIQUE: Contiguous axial images were obtained from the base of the skull through the vertex without intravenous contrast. COMPARISON:  None. FINDINGS: There is no evidence of acute intracranial hemorrhage, mass lesion, brain edema or extra-axial fluid collection. The ventricles and subarachnoid spaces are appropriately sized for age. There is no CT evidence of acute cortical infarction. Intracranial vascular calcifications noted. The visualized paranasal sinuses, mastoid air cells and middle ears are clear. The calvarium is intact. IMPRESSION: No acute intracranial findings.  Electronically Signed   By: Carey BullocksWilliam  Veazey M.D.   On: 08/24/2015 15:33   Mr Brain Wo Contrast  08/24/2015  CLINICAL DATA:  Initial evaluation for acute left-sided numbness. EXAM: MRI HEAD WITHOUT CONTRAST TECHNIQUE: Multiplanar, multiecho pulse sequences of the brain and surrounding structures were obtained without intravenous contrast. COMPARISON:  Prior CT from earlier the same day. FINDINGS: Cerebral volume within normal limits for patient age. Few scattered T2/FLAIR hyperintense foci within the periventricular and deep white matter, nonspecific, but most like related to mild chronic small vessel ischemic disease, also felt to be within normal limits for patient age. No abnormal foci of restricted diffusion to suggest acute intracranial infarct. Gray-white matter differentiation maintained. Normal intravascular flow voids preserved. No acute intracranial hemorrhage. No mass lesion, midline shift, or mass effect. No areas of chronic infarction. No hydrocephalus. No extra-axial fluid collection. Craniocervical junction within normal limits. Trace anterolisthesis of C4 on C5 noted within the upper cervical spine. Pituitary gland within normal limits. No acute abnormality about the orbits. Paranasal sinuses are clear. No mastoid effusion. Inner ear structures grossly normal. Bone marrow signal intensity within normal limits. No scalp soft tissue abnormality. IMPRESSION: Normal noncontrast brain MRI for patient age. No acute intracranial infarct or other process identified. Electronically Signed   By: Rise MuBenjamin  McClintock M.D.   On: 08/24/2015 18:32   I have personally reviewed and evaluated these images and lab results as part of my medical decision-making.   EKG Interpretation   Date/Time:  Sunday August 24 2015 11:59:49 EST Ventricular Rate:  62 PR Interval:  160 QRS Duration: 99 QT Interval:  436 QTC Calculation: 443 R Axis:   -48 Text Interpretation:  Sinus rhythm Consider left atrial  enlargement Left  anterior fascicular block Abnormal R-wave progression, early transition No  significant change since last tracing Confirmed by Nakeisha Greenhouse MD, Annabelle HarmanANA ([id number]) on  08/24/2015 1:31:47 PM      MDM   Final diagnoses:  Numbness  Low back pain without sciatica, unspecified back pain laterality    60 year old female with history of HLD who presents with left flank pain and left sided numbness. Well appearing and in no acute distress. VS stable. Recent CT abd/pelvis during symptoms of flank pain showing no retroperitoneal processes, no intraabdominal/pelvic processes, and no major spine pathology. Pain reproducible with movement and palpation of left paraspinal muscles, makes this seem more MSK in nature. Subtle 10% diminished sensation over left arm, face, and leg. Not likely spinal cord/nerve  root involvement from back pain due to involvement of face. Seems unrelated to her back pain. CT head negative. Discussed with Dr. Amada Jupiter and will perform MRI. If negative, will treat for MSK back pain and have close PCP follow-up. Discussed plan with patient and husband, who expressed understanding and agreed with plan of care.    Lavera Guise, MD 08/25/15 [phone]

## 2015-09-19 ENCOUNTER — Other Ambulatory Visit: Payer: Self-pay

## 2015-09-19 DIAGNOSIS — Z1231 Encounter for screening mammogram for malignant neoplasm of breast: Secondary | ICD-10-CM

## 2015-11-07 ENCOUNTER — Ambulatory Visit: Payer: Commercial Managed Care - PPO

## 2015-11-18 ENCOUNTER — Encounter: Payer: Self-pay | Admitting: Gastroenterology

## 2015-12-04 ENCOUNTER — Encounter: Payer: Self-pay | Admitting: Gastroenterology

## 2016-01-01 ENCOUNTER — Ambulatory Visit: Payer: Commercial Managed Care - PPO

## 2016-01-23 ENCOUNTER — Ambulatory Visit (AMBULATORY_SURGERY_CENTER): Payer: Self-pay | Admitting: *Deleted

## 2016-01-23 VITALS — Ht 66.0 in | Wt 183.6 lb

## 2016-01-23 DIAGNOSIS — Z1211 Encounter for screening for malignant neoplasm of colon: Secondary | ICD-10-CM

## 2016-01-23 MED ORDER — NA SULFATE-K SULFATE-MG SULF 17.5-3.13-1.6 GM/177ML PO SOLN: 1.0000 | Freq: Once | ORAL | Status: DC

## 2016-01-23 NOTE — Progress Notes (Signed)
Denies allergies to eggs or soy products. Denies complications with sedation or anesthesia. Denies O2 use. Denies use of diet or weight loss medications.  Emmi instructions given for colonoscopy.  

## 2016-01-28 ENCOUNTER — Encounter: Payer: Self-pay | Admitting: Gastroenterology

## 2016-02-06 ENCOUNTER — Encounter: Payer: Self-pay | Admitting: Gastroenterology

## 2016-02-06 ENCOUNTER — Ambulatory Visit (AMBULATORY_SURGERY_CENTER): Payer: Commercial Managed Care - PPO | Admitting: Gastroenterology

## 2016-02-06 VITALS — BP 107/61 | HR 52 | Temp 98.4°F | Resp 12 | Ht 66.0 in | Wt 183.0 lb

## 2016-02-06 DIAGNOSIS — Z1211 Encounter for screening for malignant neoplasm of colon: Secondary | ICD-10-CM | POA: Diagnosis not present

## 2016-02-06 MED ORDER — SODIUM CHLORIDE 0.9 % IV SOLN: 500.0000 mL | INTRAVENOUS | Status: DC

## 2016-02-06 NOTE — Progress Notes (Signed)
A and O x3. Report to RN. Tolerated MAC anesthesia well. 

## 2016-02-06 NOTE — Op Note (Signed)
[facility] Patient Name: Michele RegalDeborah Crossin Procedure Date: 02/06/2016 7:44 AM MRN: [id number] Endoscopist: Meryl DareMalcolm T Nyan Dufresne , MD Age: [age] Referring MD:  Date of Birth: Jan 15, 1956 Gender: Female Account #: [id number] Procedure:                Colonoscopy Indications:              Screening for colorectal malignant neoplasm, Last                            colonoscopy 10 years ago Medicines:                Monitored Anesthesia Care Procedure:                Pre-Anesthesia Assessment:                           - Prior to the procedure, a History and Physical                            was performed, and patient medications and                            allergies were reviewed. The patient's tolerance of                            previous anesthesia was also reviewed. The risks                            and benefits of the procedure and the sedation                            options and risks were discussed with the patient.                            All questions were answered, and informed consent                            was obtained. Prior Anticoagulants: The patient has                            taken no previous anticoagulant or antiplatelet                            agents. ASA Grade Assessment: II - A patient with                            mild systemic disease. After reviewing the risks                            and benefits, the patient was deemed in                            satisfactory condition to undergo the procedure.  After obtaining informed consent, the colonoscope                            was passed under direct vision. Throughout the                            procedure, the patient's blood pressure, pulse, and                            oxygen saturations were monitored continuously. The                            Model PCF-H190L [id number]) scope was introduced                            through the anus and advanced to  the the cecum,                            identified by appendiceal orifice and ileocecal                            valve. The ileocecal valve, appendiceal orifice,                            and rectum were photographed. The quality of the                            bowel preparation was excellent. The colonoscopy                            was performed without difficulty. The patient                            tolerated the procedure well. Scope In: 8:08:56 AM Scope Out: 8:22:24 AM Scope Withdrawal Time: 0 hours 9 minutes 26 seconds  Total Procedure Duration: 0 hours 13 minutes 28 seconds  Findings:                 The exam was otherwise without abnormality on                            direct and retroflexion views.                           A few medium-mouthed diverticula were found in the                            sigmoid colon. Complications:            No immediate complications. Estimated blood loss:                            None. Estimated Blood Loss:     Estimated blood loss: none. Impression:               - Diverticulosis in the sigmoid colon.                           -  Otherwise normal exam Recommendation:           - Repeat colonoscopy in 10 years for screening                            purposes.                           - Patient has a contact number available for                            emergencies. The signs and symptoms of potential                            delayed complications were discussed with the                            patient. Return to normal activities tomorrow.                            Written discharge instructions were provided to the                            patient.                           - High fiber diet.                           - Continue present medications. Meryl DareMalcolm T Dailan Pfalzgraf, MD 02/06/2016 8:25:51 AM This report has been signed electronically.

## 2016-02-06 NOTE — Patient Instructions (Signed)
YOU HAD AN ENDOSCOPIC PROCEDURE TODAY AT THE [facility]:   Refer to the procedure report that was given to you for any specific questions about what was found during the examination.  If the procedure report does not answer your questions, please call your gastroenterologist to clarify.  If you requested that your care partner not be given the details of your procedure findings, then the procedure report has been included in a sealed envelope for you to review at your convenience later.  YOU SHOULD EXPECT: Some feelings of bloating in the abdomen. Passage of more gas than usual.  Walking can help get rid of the air that was put into your GI tract during the procedure and reduce the bloating. If you had a lower endoscopy (such as a colonoscopy or flexible sigmoidoscopy) you may notice spotting of blood in your stool or on the toilet paper. If you underwent a bowel prep for your procedure, you may not have a normal bowel movement for a few days.  Please Note:  You might notice some irritation and congestion in your nose or some drainage.  This is from the oxygen used during your procedure.  There is no need for concern and it should clear up in a day or so.  SYMPTOMS TO REPORT IMMEDIATELY:   Following lower endoscopy (colonoscopy or flexible sigmoidoscopy):  Excessive amounts of blood in the stool  Significant tenderness or worsening of abdominal pains  Swelling of the abdomen that is new, acute  Fever of 100F or higher    For urgent or emergent issues, a gastroenterologist can be reached at any hour by calling [phone].   DIET: Your first meal following the procedure should be a small meal and then it is ok to progress to your normal diet. Heavy or fried foods are harder to digest and may make you feel nauseous or bloated.  Likewise, meals heavy in dairy and vegetables can increase bloating.  Drink plenty of fluids but you should avoid alcoholic beverages for 24  hours.  ACTIVITY:  You should plan to take it easy for the rest of today and you should NOT DRIVE or use heavy machinery until tomorrow (because of the sedation medicines used during the test).    FOLLOW UP: Our staff will call the number listed on your records the next business day following your procedure to check on you and address any questions or concerns that you may have regarding the information given to you following your procedure. If we do not reach you, we will leave a message.  However, if you are feeling well and you are not experiencing any problems, there is no need to return our call.  We will assume that you have returned to your regular daily activities without incident.  If any biopsies were taken you will be contacted by phone or by letter within the next 1-3 weeks.  Please call us at [phone] if you have not heard about the biopsies in 3 weeks.    SIGNATURES/CONFIDENTIALITY: You and/or your care partner have signed paperwork which will be entered into your electronic medical record.  These signatures attest to the fact that that the information above on your After Visit Summary has been reviewed and is understood.  Full responsibility of the confidentiality of this discharge information lies with you and/or your care-partner.   Information on diverticulosis and high fiber diet given to you today 

## 2016-02-09 ENCOUNTER — Telehealth: Payer: Self-pay | Admitting: *Deleted

## 2016-02-09 NOTE — Telephone Encounter (Signed)
  Follow up Call-  Call back number 02/06/2016 06/11/2014  Post procedure Call Back phone  # [phone]  Permission to leave phone message Yes Yes     Patient questions:  Do you have a fever, pain , or abdominal swelling? No. Pain Score  0 *  Have you tolerated food without any problems? Yes.    Have you been able to return to your normal activities? Yes.    Do you have any questions about your discharge instructions: Diet   No. Medications  No. Follow up visit  No.  Do you have questions or concerns about your Care? No.  Actions: * If pain score is 4 or above: No action needed, pain <4.  Patient states she had nausea when she got home Friday after procedure, but Sat. And Sunday and today she is doing "great".

## 2017-01-13 IMAGING — CT CT ABD-PELV W/O CM
1 of 2 series · 15 of 32 positions shown, 19 images · non-contrast
Comparison: None.

CLINICAL DATA: Right-sided flank pain and history of micro
hematuria

EXAM:
CT ABDOMEN AND PELVIS WITHOUT CONTRAST
TECHNIQUE: Multidetector CT imaging of the abdomen and pelvis was performed
following the standard protocol without IV contrast.

[Series 2: renal standard/full · axial · 0.68mm/px · z∈[-751,-326]mm · 15 of 93 slices shown, 19 images]
[im 4/93  soft-tissue]
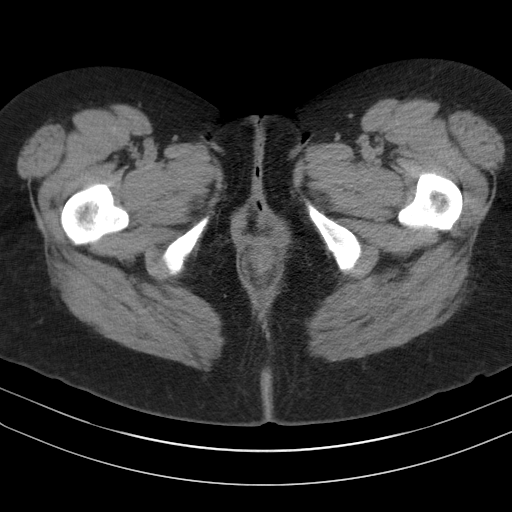
[im 4/93  bone]
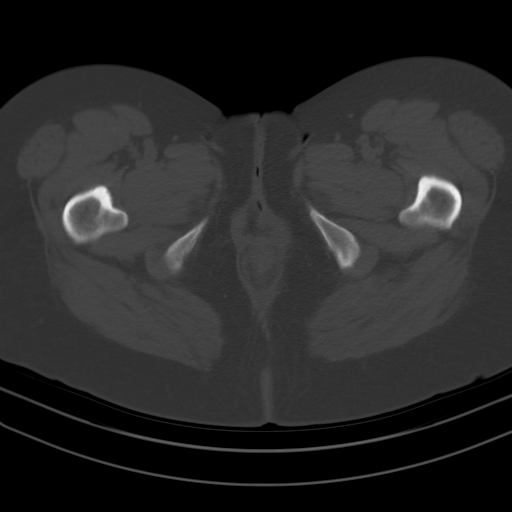
[im 12/93  soft-tissue]
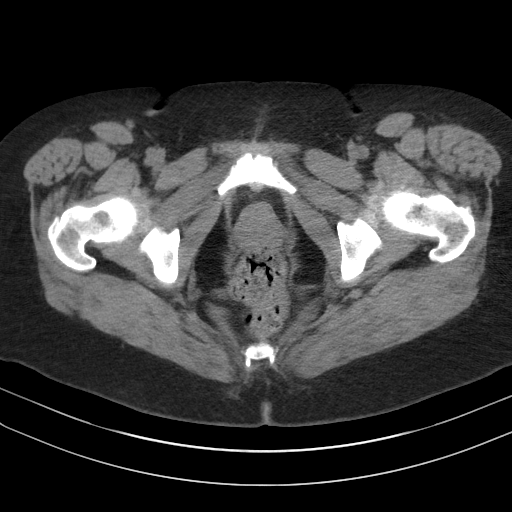
[im 19/93  soft-tissue]
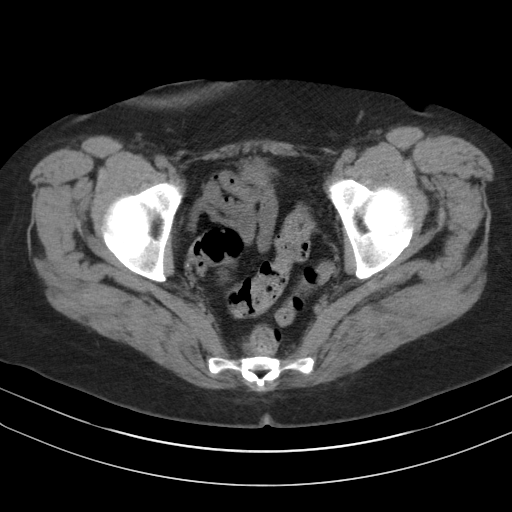
[im 26/93  soft-tissue]
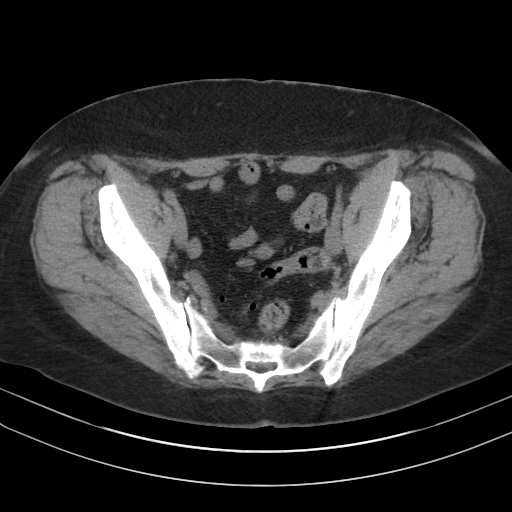
[im 34/93  soft-tissue]
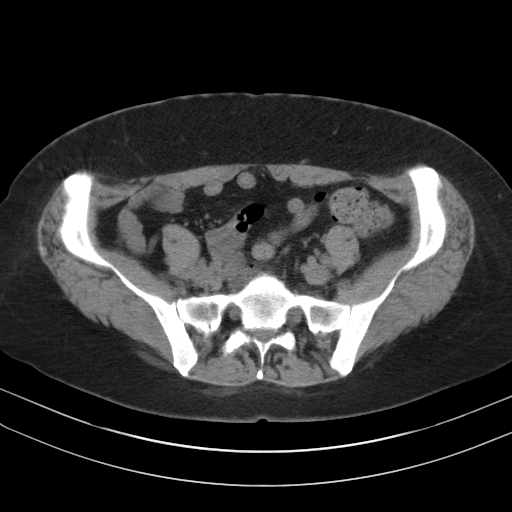
[im 41/93  soft-tissue]
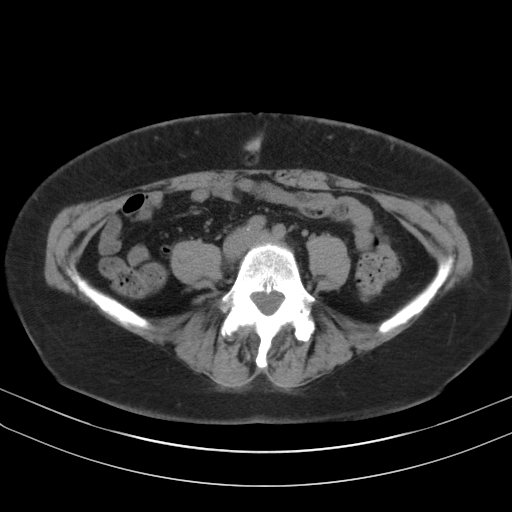
[im 48/93  soft-tissue]
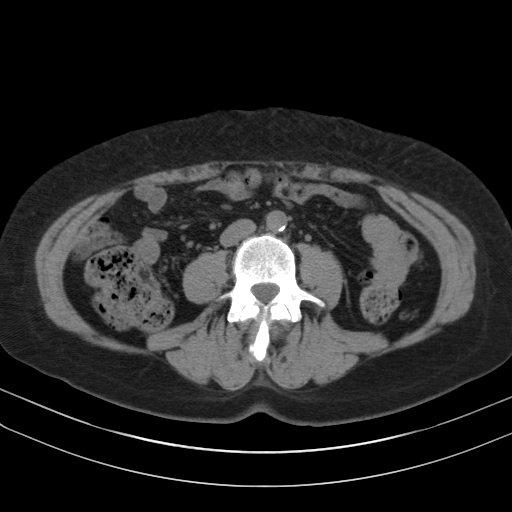
[im 52/93  soft-tissue]
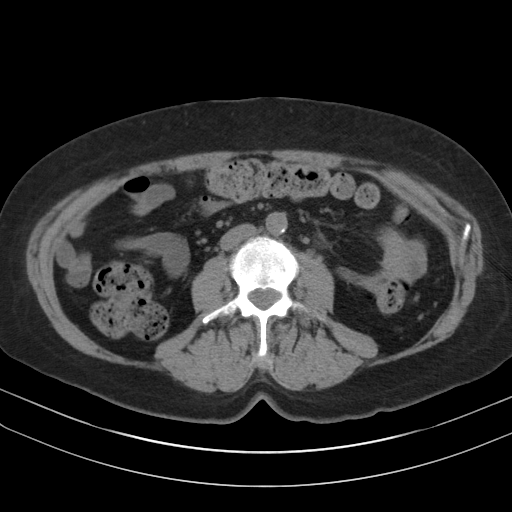
[im 59/93  soft-tissue]
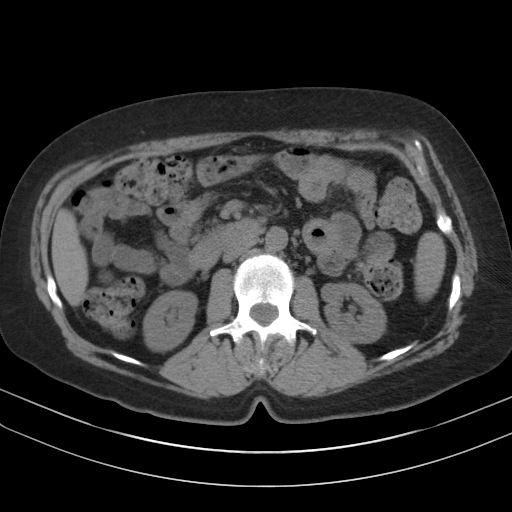
[im 59/93  bone]
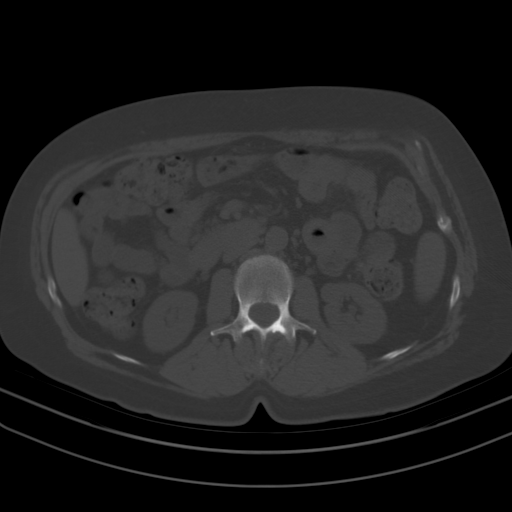
[im 67/93  soft-tissue]
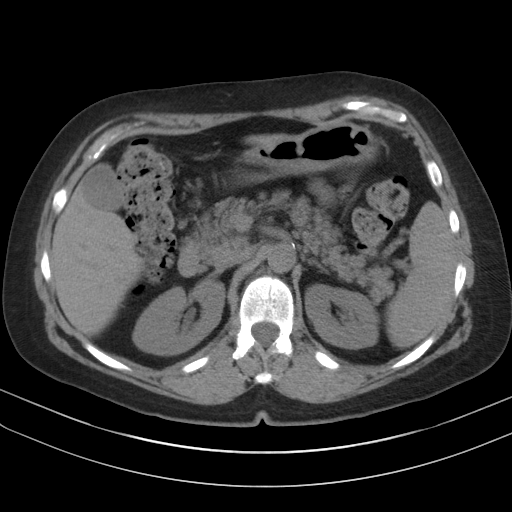
[im 74/93  soft-tissue]
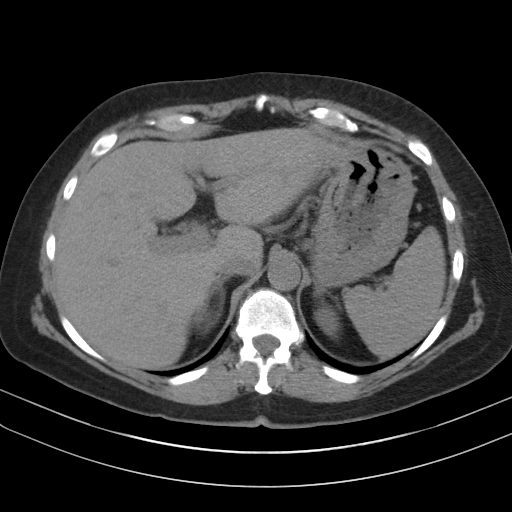
[im 78/93  lung]
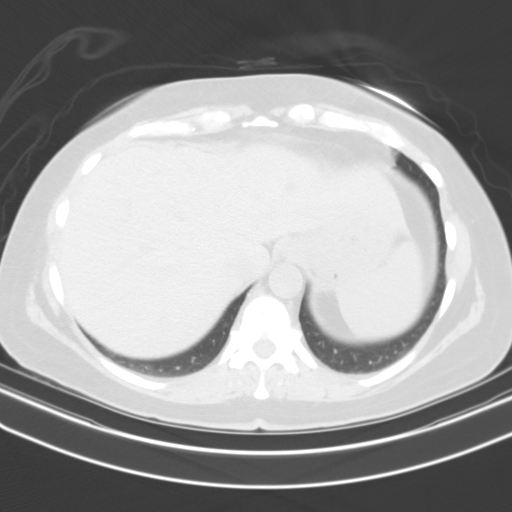
[im 81/93  soft-tissue]
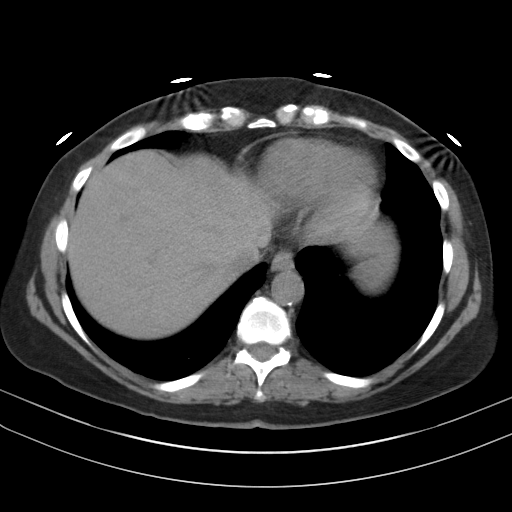
[im 81/93  lung]
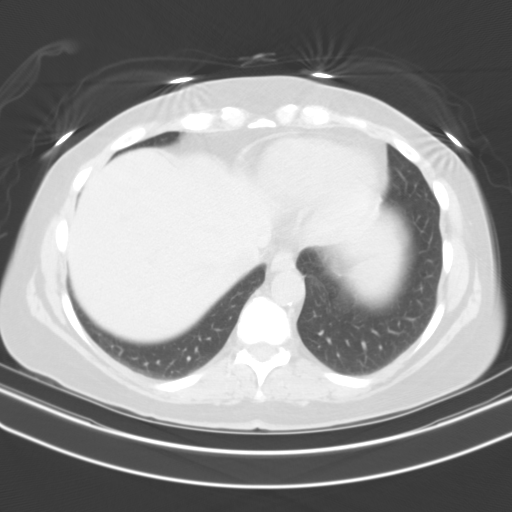
[im 85/93  lung]
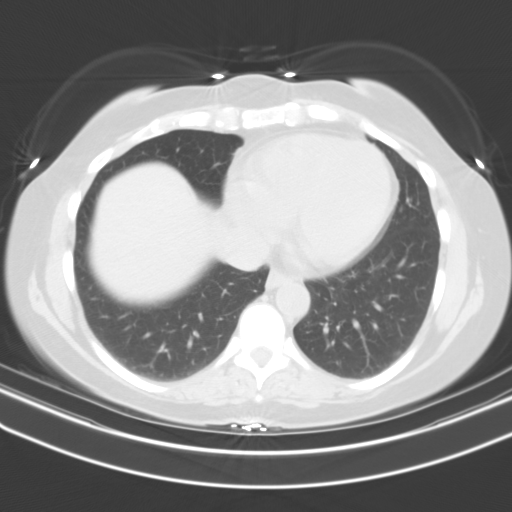
[im 89/93  soft-tissue]
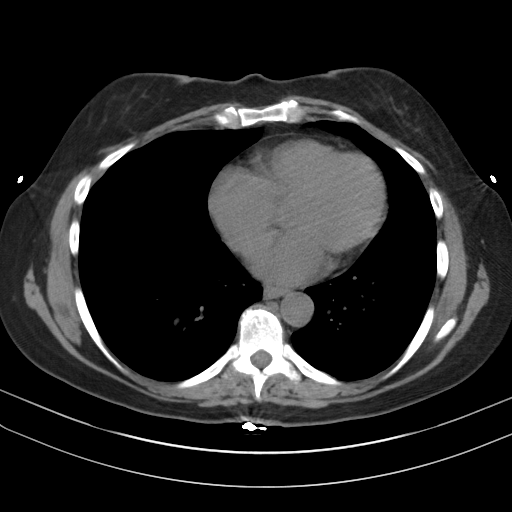
[im 89/93  lung]
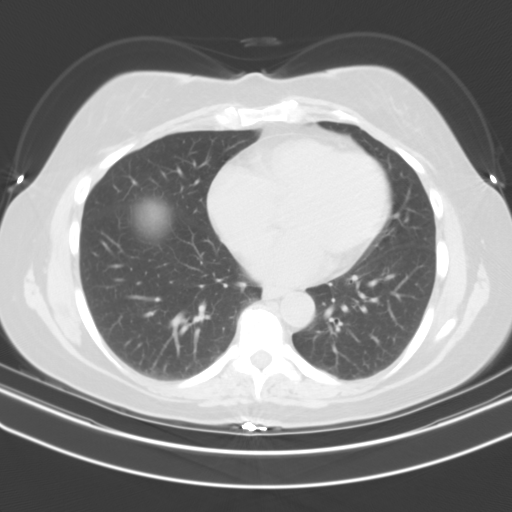

[15 of 32 positions shown; findings below may reference images not displayed]

FINDINGS: Lung bases are free of acute infiltrate or sizable effusion.

The liver demonstrates scattered hypodensities consistent with small
cysts measuring less than 1 cm. The gallbladder is within normal
limits. The spleen, adrenal glands and pancreas are unremarkable.
Kidneys demonstrate no evidence of renal calculi or obstructive
changes. Small extrarenal pelves are noted bilaterally.

The appendix is within normal limits. The bladder is decompressed.
No pelvic mass lesion is seen. The uterus appears have been
surgically removed. The osseous structures are within normal limits
for the patient's age.
IMPRESSION: Hepatic cysts.

No other focal abnormality is noted.

## 2018-05-17 DIAGNOSIS — E785 Hyperlipidemia, unspecified: Secondary | ICD-10-CM | POA: Diagnosis not present

## 2018-05-17 DIAGNOSIS — N183 Chronic kidney disease, stage 3 (moderate): Secondary | ICD-10-CM | POA: Diagnosis not present

## 2018-09-09 DIAGNOSIS — J0140 Acute pansinusitis, unspecified: Secondary | ICD-10-CM | POA: Diagnosis not present

## 2018-09-09 DIAGNOSIS — B349 Viral infection, unspecified: Secondary | ICD-10-CM | POA: Diagnosis not present

## 2018-10-23 DIAGNOSIS — R509 Fever, unspecified: Secondary | ICD-10-CM | POA: Diagnosis not present

## 2018-10-23 DIAGNOSIS — R0989 Other specified symptoms and signs involving the circulatory and respiratory systems: Secondary | ICD-10-CM | POA: Diagnosis not present

## 2018-10-23 DIAGNOSIS — J189 Pneumonia, unspecified organism: Secondary | ICD-10-CM | POA: Diagnosis not present

## 2018-10-30 DIAGNOSIS — R05 Cough: Secondary | ICD-10-CM | POA: Diagnosis not present

## 2018-10-30 DIAGNOSIS — R509 Fever, unspecified: Secondary | ICD-10-CM | POA: Diagnosis not present

## 2018-10-30 DIAGNOSIS — J189 Pneumonia, unspecified organism: Secondary | ICD-10-CM | POA: Diagnosis not present

## 2018-11-14 DIAGNOSIS — E785 Hyperlipidemia, unspecified: Secondary | ICD-10-CM | POA: Diagnosis not present

## 2018-11-14 DIAGNOSIS — N183 Chronic kidney disease, stage 3 (moderate): Secondary | ICD-10-CM | POA: Diagnosis not present

## 2018-11-14 DIAGNOSIS — E039 Hypothyroidism, unspecified: Secondary | ICD-10-CM | POA: Diagnosis not present

## 2018-11-23 DIAGNOSIS — E785 Hyperlipidemia, unspecified: Secondary | ICD-10-CM | POA: Diagnosis not present

## 2018-11-23 DIAGNOSIS — E039 Hypothyroidism, unspecified: Secondary | ICD-10-CM | POA: Diagnosis not present

## 2018-11-23 DIAGNOSIS — N183 Chronic kidney disease, stage 3 (moderate): Secondary | ICD-10-CM | POA: Diagnosis not present

## 2018-11-30 DIAGNOSIS — R0602 Shortness of breath: Secondary | ICD-10-CM | POA: Diagnosis not present

## 2018-11-30 DIAGNOSIS — R5383 Other fatigue: Secondary | ICD-10-CM | POA: Diagnosis not present

## 2018-11-30 DIAGNOSIS — R1310 Dysphagia, unspecified: Secondary | ICD-10-CM | POA: Diagnosis not present

## 2019-05-22 ENCOUNTER — Other Ambulatory Visit: Payer: Self-pay | Admitting: Family Medicine

## 2019-05-22 DIAGNOSIS — E2839 Other primary ovarian failure: Secondary | ICD-10-CM

## 2019-08-20 ENCOUNTER — Other Ambulatory Visit: Payer: Self-pay

## 2019-08-20 ENCOUNTER — Ambulatory Visit
Admission: RE | Admit: 2019-08-20 | Discharge: 2019-08-20 | Disposition: A | Discharge status: Home / Self Care | Payer: Managed Care, Other (non HMO) | Source: Ambulatory Visit | Attending: Family Medicine | Admitting: Family Medicine

## 2019-08-20 DIAGNOSIS — E2839 Other primary ovarian failure: Secondary | ICD-10-CM

## 2020-01-01 DIAGNOSIS — G43809 Other migraine, not intractable, without status migrainosus: Secondary | ICD-10-CM | POA: Diagnosis not present

## 2020-01-01 DIAGNOSIS — R42 Dizziness and giddiness: Secondary | ICD-10-CM | POA: Diagnosis not present

## 2020-01-01 DIAGNOSIS — M542 Cervicalgia: Secondary | ICD-10-CM | POA: Diagnosis not present

## 2020-03-20 DIAGNOSIS — E785 Hyperlipidemia, unspecified: Secondary | ICD-10-CM | POA: Diagnosis not present

## 2020-03-20 DIAGNOSIS — N1832 Chronic kidney disease, stage 3b: Secondary | ICD-10-CM | POA: Diagnosis not present

## 2020-03-20 DIAGNOSIS — F324 Major depressive disorder, single episode, in partial remission: Secondary | ICD-10-CM | POA: Diagnosis not present

## 2020-03-20 DIAGNOSIS — E039 Hypothyroidism, unspecified: Secondary | ICD-10-CM | POA: Diagnosis not present

## 2020-03-20 DIAGNOSIS — K219 Gastro-esophageal reflux disease without esophagitis: Secondary | ICD-10-CM | POA: Diagnosis not present

## 2020-03-20 NOTE — Progress Notes (Signed)
NEUROLOGY CONSULTATION NOTE  SHARHONDA Arroyo MRN: [id number] DOB: 10/02/1955  Referring provider: Horton Marshall, PA Primary care provider: Elias Else, MD  Reason for consult:  Dizziness, migraines  HISTORY OF PRESENT ILLNESS: [name] is a 64 year old right-handed female with hypothyroidism, CKD stage 3b, depression who presents for dizziness and migraines.  History supplemented by referring provider's note.  She developed new onset headache and dizziness about 3 to 4 months ago, which has progressively gotten worse.  She has a persistent mild headache.  She will have episodes of severe left sided throbbing and pressure that may also be holocephalic and retro-orbital bilaterally.  There is associated left sided neck stiffness.  She has photophobia, phonophobia, and a little nausea but no vomiting, visual disturbance, numbness or weakness.  She has associated spinning sensation that may last 2 to 3 hours, aggravated by change in position.  Headaches last 6 to 8 hours (2 to 4 hours with treatment) and occur 1 to 2 times a week.  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  She treats headaches with regimen of rizatriptan, cyclobenzaprine and meclizine.  She reports history of "sinus headaches" but not migraines.  Remote MRI of brain from 08/24/2015 to evaluate left sided numbness was personally reviewed and was normal.  Labs from last week include normal CBC, BUN 20, Cr 1.22 and GFR 44.  Cbc cmp cr 1.22 gfr 44 bun 20 Current NSAIDS:  none Current analgesics:  none Current triptans:  Rizatriptan 20mg  Current ergotamine:  none Current anti-emetic:  Zofran 8mg  Current muscle relaxants:  Flexeril 10mg  QHS PRN Current anti-anxiolytic:  none Current sleep aide:  trazodone Current Antihypertensive medications:  none Current Antidepressant medications:  Wellbutrin, trazodone Current Anticonvulsant medications:  none Current anti-CGRP:   none Current Vitamins/Herbal/Supplements:  Ca, D Current Antihistamines/Decongestants:  meclizine Other therapy:  none Hormone/birth control:  none  Past NSAIDS:  Contraindication (renal problems) Past analgesics:  Tylenol, Excedrin Past abortive triptans:  none Past abortive ergotamine:  none Past muscle relaxants:  none Past anti-emetic:  none Past antihypertensive medications:  none Past antidepressant medications:  none Past anticonvulsant medications:  none Past anti-CGRP:  none Past vitamins/Herbal/Supplements:  none Past antihistamines/decongestants:  none Other past therapies:  none  Caffeine:  No  Diet:  8 glasses water daily.  May skip meals Depression:  yes; Anxiety:  yes Sleep hygiene:  Some difficulty.  Uses trazodone, which is helpful.   PAST MEDICAL HISTORY: Past Medical History:  Diagnosis Date  . Acid reflux   . Apocrine metaplasia of breast   . Asthma   . BV (bacterial vaginosis)   . Depression    h/o  . Endometriosis    h/o  . HSV-1 (herpes simplex virus 1) infection   . Hyperlipidemia   . Hypothyroid   . Obese   . Seasonal allergies     PAST SURGICAL HISTORY: Past Surgical History:  Procedure Laterality Date  . ABDOMINAL HYSTERECTOMY    . CESAREAN SECTION     x3  . dental work     cap placed 02-02-16  . LEG SURGERY     had non cancerous tumors removed from left leg  . SINUS ENDO W/FUSION     x3  . TUBAL LIGATION    . WISDOM TOOTH EXTRACTION      MEDICATIONS: Current Outpatient Medications on File Prior to Visit  Medication Sig Dispense Refill  . azelastine (ASTELIN) 0.1 % nasal spray Place  1 spray into both nostrils 2 (two) times daily as needed for allergies. Reported on 01/23/2016    . levothyroxine (SYNTHROID, LEVOTHROID) 88 MCG tablet Take 75 mcg by mouth every morning. Monday-Saturday.    . Multiple Vitamins-Minerals (MULTIVITAMINS THER. W/MINERALS) TABS Take 1 tablet by mouth daily.      . NON FORMULARY 2 tablets daily.  Magnical D    . Omega-3 Fatty Acids (OMEGA 3 PO) Take 1 tablet by mouth daily.    . polyvinyl alcohol (LIQUIFILM TEARS) 1.4 % ophthalmic solution Place 1 drop into both eyes daily as needed for dry eyes.    . pravastatin (PRAVACHOL) 40 MG tablet Take 20 mg by mouth every other day.   2   No current facility-administered medications on file prior to visit.    ALLERGIES: Allergies  Allergen Reactions  . Erythromycin     Abd pain  . Amoxicillin-Pot Clavulanate Nausea And Vomiting  . Darvocet [Propoxyphene N-Acetaminophen] Nausea And Vomiting    FAMILY HISTORY: Family History  Problem Relation Age of Onset  . Stroke Other   . Asthma Mother   . Mitral valve prolapse Mother   . Thyroid disease Mother   . Heart disease Brother   . Mitral valve prolapse Brother   . Cancer Maternal Aunt        uterine and liver and kidney  . Cancer Maternal Uncle   . Emphysema Maternal Uncle   . Diabetes Maternal Grandmother   . Heart disease Maternal Grandmother   . Thyroid disease Maternal Grandmother   . Diabetes Paternal Grandmother   . Diabetes Paternal Grandfather   . Stomach cancer Neg Hx   . Rectal cancer Neg Hx   . Colon cancer Paternal Uncle     SOCIAL HISTORY: Social History   Socioeconomic History  . Marital status: Married    Spouse name: Not on file  . Number of children: 3  . Years of education: Not on file  . Highest education level: Not on file  Occupational History  . Occupation: Scientist, physiological: [employer]  Tobacco Use  . Smoking status: Never Smoker  . Smokeless tobacco: Never Used  Substance and Sexual Activity  . Alcohol use: Yes    Comment: socially  . Drug use: No  . Sexual activity: Not on file    Comment: btl  Other Topics Concern  . Not on file  Social History Narrative  . Not on file   Social Determinants of Health   Financial Resource Strain:   . Difficulty of Paying Living Expenses:   Food Insecurity:   . Worried About Patent examiner in the Last Year:   . Barista in the Last Year:   Transportation Needs:   . Freight forwarder (Medical):   Marland Kitchen Lack of Transportation (Non-Medical):   Physical Activity:   . Days of Exercise per Week:   . Minutes of Exercise per Session:   Stress:   . Feeling of Stress :   Social Connections:   . Frequency of Communication with Friends and Family:   . Frequency of Social Gatherings with Friends and Family:   . Attends Religious Services:   . Active Member of Clubs or Organizations:   . Attends Banker Meetings:   Marland Kitchen Marital Status:   Intimate Partner Violence:   . Fear of Current or Ex-Partner:   . Emotionally Abused:   Marland Kitchen Physically Abused:   . Sexually Abused:  PHYSICAL EXAM: Blood pressure 113/66, pulse 83, height 5\' 6"  (1.676 m), weight 189 lb 3.2 oz (85.8 kg), SpO2 99 %. General: No acute distress.  Patient appears well-groomed.  Head:  Normocephalic/atraumatic Eyes:  fundi examined but not visualized Neck: supple, no paraspinal tenderness, full range of motion Back: No paraspinal tenderness Heart: regular rate and rhythm Lungs: Clear to auscultation bilaterally. Vascular: No carotid bruits. Neurological Exam: Mental status: alert and oriented to person, place, and time, recent and remote memory intact, fund of knowledge intact, attention and concentration intact, speech fluent and not dysarthric, language intact. Cranial nerves: CN I: not tested CN II: pupils equal, round and reactive to light, visual fields intact CN III, IV, VI:  full range of motion, no nystagmus, no ptosis CN V: facial sensation intact CN VII: upper and lower face symmetric CN VIII: hearing intact CN IX, X: gag intact, uvula midline CN XI: sternocleidomastoid and trapezius muscles intact CN XII: tongue midline Bulk & Tone: normal, no fasciculations. Motor:  5/5 throughout  Sensation:  Pinprick sensation reduced in first toe of right foot; and vibration  sensation intact.. Deep Tendon Reflexes:  2+ throughout, toes downgoing.  Finger to nose testing:  Without dysmetria.  Heel to shin:  Without dysmetria.  Gait:  Normal station and stride.  Romberg negative.  IMPRESSION: 1.  Chronic daily headache with episodes of vertigo.  Suspect vestibular migraine.  However, given new onset of chronic headaches, would need to rule out secondary intracranial etiology.  PLAN: 1.  Stop trazodone.  Instead, start nortriptyline 10mg  at bedtime for a week, then 20mg  at bedtime.  We can increase to 50mg  at bedtime in 5 weeks if needed.  Hopefully, this will help treat insomnia and headaches. 2.  For rescue, rizatriptan/Flexeril/meclizine.   3.  Limit use of pain relievers to no more than 2 days out of week to prevent risk of rebound or medication-overuse headache. 4.  Check MRI and MRA of head 5.  Follow up in 6 months.  Thank you for allowing me to take part in the care of this patient.  , DO  CC:  ,  , MD

## 2020-03-24 ENCOUNTER — Ambulatory Visit (INDEPENDENT_AMBULATORY_CARE_PROVIDER_SITE_OTHER): Payer: BC Managed Care – PPO | Admitting: Neurology

## 2020-03-24 ENCOUNTER — Encounter: Payer: Self-pay | Admitting: Neurology

## 2020-03-24 ENCOUNTER — Other Ambulatory Visit: Payer: Self-pay

## 2020-03-24 VITALS — BP 113/66 | HR 83 | Ht 66.0 in | Wt 189.2 lb

## 2020-03-24 DIAGNOSIS — G43809 Other migraine, not intractable, without status migrainosus: Secondary | ICD-10-CM

## 2020-03-24 DIAGNOSIS — R519 Headache, unspecified: Secondary | ICD-10-CM

## 2020-03-24 DIAGNOSIS — G441 Vascular headache, not elsewhere classified: Secondary | ICD-10-CM

## 2020-03-24 MED ORDER — NORTRIPTYLINE HCL 10 MG PO CAPS
ORAL_CAPSULE | ORAL | 0 refills | Status: DC
Start: 2020-03-24 — End: 2020-04-16

## 2020-03-24 NOTE — Patient Instructions (Addendum)
1.  Will check MRI and MRA of head. We have sent a referral to [facility] for your MRI, MRA  and they will call you directly to schedule your appointment. They are located at [address] [facility]. If you need to contact them directly please call [phone].  2.  Stop trazodone.  Start nortriptyline 10mg  at bedtime for one week, then increase to 20mg  at bedtime.  Contact me at end of prescription for update and refill. 3.  Use rizatriptan, meclizine and cyclobenzaprine for severe attacks if needed. 4.  Limit use of pain relievers (including rizatriptan) to no more than 2 days out of week to prevent risk of rebound or medication-overuse headache. 5.  Follow up in 6 months.

## 2020-04-10 DIAGNOSIS — R944 Abnormal results of kidney function studies: Secondary | ICD-10-CM | POA: Diagnosis not present

## 2020-04-15 ENCOUNTER — Other Ambulatory Visit: Payer: Self-pay | Admitting: Neurology

## 2020-04-15 ENCOUNTER — Ambulatory Visit
Admission: RE | Admit: 2020-04-15 | Discharge: 2020-04-15 | Disposition: A | Discharge status: Home / Self Care | Payer: BC Managed Care – PPO | Source: Ambulatory Visit | Attending: Neurology | Admitting: Neurology

## 2020-04-15 ENCOUNTER — Telehealth: Payer: Self-pay

## 2020-04-15 ENCOUNTER — Other Ambulatory Visit: Payer: Self-pay

## 2020-04-15 DIAGNOSIS — G43909 Migraine, unspecified, not intractable, without status migrainosus: Secondary | ICD-10-CM | POA: Diagnosis not present

## 2020-04-15 DIAGNOSIS — R519 Headache, unspecified: Secondary | ICD-10-CM

## 2020-04-15 DIAGNOSIS — G43809 Other migraine, not intractable, without status migrainosus: Secondary | ICD-10-CM

## 2020-04-15 DIAGNOSIS — G441 Vascular headache, not elsewhere classified: Secondary | ICD-10-CM

## 2020-04-15 NOTE — Telephone Encounter (Signed)
Patient advised of results.

## 2020-04-22 DIAGNOSIS — Z6832 Body mass index (BMI) 32.0-32.9, adult: Secondary | ICD-10-CM | POA: Diagnosis not present

## 2020-04-22 DIAGNOSIS — Z1231 Encounter for screening mammogram for malignant neoplasm of breast: Secondary | ICD-10-CM | POA: Diagnosis not present

## 2020-04-22 DIAGNOSIS — N952 Postmenopausal atrophic vaginitis: Secondary | ICD-10-CM | POA: Diagnosis not present

## 2020-04-22 DIAGNOSIS — Z01419 Encounter for gynecological examination (general) (routine) without abnormal findings: Secondary | ICD-10-CM | POA: Diagnosis not present

## 2020-07-11 DIAGNOSIS — R059 Cough, unspecified: Secondary | ICD-10-CM | POA: Diagnosis not present

## 2020-07-11 DIAGNOSIS — Z1152 Encounter for screening for COVID-19: Secondary | ICD-10-CM | POA: Diagnosis not present

## 2020-07-17 DIAGNOSIS — Z03818 Encounter for observation for suspected exposure to other biological agents ruled out: Secondary | ICD-10-CM | POA: Diagnosis not present

## 2020-07-17 DIAGNOSIS — J069 Acute upper respiratory infection, unspecified: Secondary | ICD-10-CM | POA: Diagnosis not present

## 2020-07-18 ENCOUNTER — Telehealth: Payer: Self-pay | Admitting: Unknown Physician Specialty

## 2020-07-18 ENCOUNTER — Other Ambulatory Visit: Payer: Self-pay | Admitting: Unknown Physician Specialty

## 2020-07-18 ENCOUNTER — Encounter: Payer: Self-pay | Admitting: Unknown Physician Specialty

## 2020-07-18 DIAGNOSIS — J45909 Unspecified asthma, uncomplicated: Secondary | ICD-10-CM

## 2020-07-18 DIAGNOSIS — U071 COVID-19: Secondary | ICD-10-CM

## 2020-07-18 NOTE — Telephone Encounter (Signed)
I connected by phone with [name] on 07/18/2020 at 4:44 PM to discuss the potential use of a new treatment for mild to moderate COVID-19 viral infection in non-hospitalized patients.  This patient is a 64 y.o. female that meets the FDA criteria for Emergency Use Authorization of COVID monoclonal antibody casirivimab/imdevimab, bamlanivimab/eteseviamb, or sotrovimab.  Has a (+) direct SARS-CoV-2 viral test result  Has mild or moderate COVID-19   Is NOT hospitalized due to COVID-19  Is within 10 days of symptom onset  Has at least one of the high risk factor(s) for progression to severe COVID-19 and/or hospitalization as defined in EUA.  Specific high risk criteria : Older age (>/= 64 yo)   I have spoken and communicated the following to the patient or parent/caregiver regarding COVID monoclonal antibody treatment:  1. FDA has authorized the emergency use for the treatment of mild to moderate COVID-19 in adults and pediatric patients with positive results of direct SARS-CoV-2 viral testing who are 69 years of age and older weighing at least 40 kg, and who are at high risk for progressing to severe COVID-19 and/or hospitalization.  2. The significant known and potential risks and benefits of COVID monoclonal antibody, and the extent to which such potential risks and benefits are unknown.  3. Information on available alternative treatments and the risks and benefits of those alternatives, including clinical trials.  4. Patients treated with COVID monoclonal antibody should continue to self-isolate and use infection control measures (e.g., wear mask, isolate, social distance, avoid sharing personal items, clean and disinfect "high touch" surfaces, and frequent handwashing) according to CDC guidelines.   5. The patient or parent/caregiver has the option to accept or refuse COVID monoclonal antibody treatment.  After reviewing this information with the patient, the patient has agreed  to receive one of the available covid 19 monoclonal antibodies and will be provided an appropriate fact sheet prior to infusion. [name], NP 07/18/2020 4:44 PM  Sx onset 12/2-Vacc x3 and feels worse

## 2020-07-18 NOTE — Progress Notes (Signed)
Day 9 of symptoms

## 2020-07-19 ENCOUNTER — Other Ambulatory Visit (HOSPITAL_COMMUNITY): Payer: Self-pay

## 2020-07-19 ENCOUNTER — Ambulatory Visit (HOSPITAL_COMMUNITY)
Admission: RE | Admit: 2020-07-19 | Discharge: 2020-07-19 | Disposition: A | Discharge status: Home / Self Care | Payer: BC Managed Care – PPO | Source: Ambulatory Visit | Attending: Pulmonary Disease | Admitting: Pulmonary Disease

## 2020-07-19 DIAGNOSIS — U071 COVID-19: Secondary | ICD-10-CM | POA: Insufficient documentation

## 2020-07-19 DIAGNOSIS — J45909 Unspecified asthma, uncomplicated: Secondary | ICD-10-CM | POA: Insufficient documentation

## 2020-07-19 MED ORDER — SODIUM CHLORIDE 0.9 % IV SOLN: INTRAVENOUS | Status: DC | PRN

## 2020-07-19 MED ORDER — DIPHENHYDRAMINE HCL 50 MG/ML IJ SOLN: 50.0000 mg | Freq: Once | INTRAMUSCULAR | Status: DC | PRN

## 2020-07-19 MED ORDER — ALBUTEROL SULFATE HFA 108 (90 BASE) MCG/ACT IN AERS: 2.0000 | INHALATION_SPRAY | Freq: Once | RESPIRATORY_TRACT | Status: DC | PRN

## 2020-07-19 MED ORDER — EPINEPHRINE 0.3 MG/0.3ML IJ SOAJ: 0.3000 mg | Freq: Once | INTRAMUSCULAR | Status: DC | PRN

## 2020-07-19 MED ORDER — SODIUM CHLORIDE 0.9 % IV SOLN: Freq: Once | INTRAVENOUS | Status: AC

## 2020-07-19 MED ORDER — METHYLPREDNISOLONE SODIUM SUCC 125 MG IJ SOLR: 125.0000 mg | Freq: Once | INTRAMUSCULAR | Status: DC | PRN

## 2020-07-19 MED ORDER — FAMOTIDINE IN NACL 20-0.9 MG/50ML-% IV SOLN: 20.0000 mg | Freq: Once | INTRAVENOUS | Status: DC | PRN

## 2020-07-19 NOTE — Progress Notes (Signed)
°  Diagnosis: COVID-19 ° °Physician: Dr. Wright ° °Procedure: [facility] Med: bamlanivimab\etesevimab infusion - Provided patient with bamlanimivab\etesevimab fact sheet for patients, parents and caregivers prior to infusion. ° °Complications: No immediate complications noted. ° °Discharge: Discharged home  ° °[name] °07/19/2020 ° °

## 2020-07-19 NOTE — Discharge Instructions (Signed)
10 Things You Can Do to Manage Your COVID-19 Symptoms at Home If you have possible or confirmed COVID-19: 1. Stay home from work and school. And stay away from other public places. If you must go out, avoid using any kind of public transportation, ridesharing, or taxis. 2. Monitor your symptoms carefully. If your symptoms get worse, call your healthcare provider immediately. 3. Get rest and stay hydrated. 4. If you have a medical appointment, call the healthcare provider ahead of time and tell them that you have or may have COVID-19. 5. For medical emergencies, call 911 and notify the dispatch personnel that you have or may have COVID-19. 6. Cover your cough and sneezes with a tissue or use the inside of your elbow. 7. Wash your hands often with soap and water for at least 20 seconds or clean your hands with an alcohol-based hand sanitizer that contains at least 60% alcohol. 8. As much as possible, stay in a specific room and away from other people in your home. Also, you should use a separate bathroom, if available. If you need to be around other people in or outside of the home, wear a mask. 9. Avoid sharing personal items with other people in your household, like dishes, towels, and bedding. 10. Clean all surfaces that are touched often, like counters, tabletops, and doorknobs. Use household cleaning sprays or wipes according to the label instructions. cdc.gov/coronavirus 02/07/2019 This information is not intended to replace advice given to you by your health care provider. Make sure you discuss any questions you have with your health care provider. Document Revised: 07/12/2019 Document Reviewed: 07/12/2019 Elsevier Patient Education  2020 Elsevier Inc. What types of side effects do monoclonal antibody drugs cause?  Common side effects  In general, the more common side effects caused by monoclonal antibody drugs include: . Allergic reactions, such as hives or itching . Flu-like signs and  symptoms, including chills, fatigue, fever, and muscle aches and pains . Nausea, vomiting . Diarrhea . Skin rashes . Low blood pressure   The CDC is recommending patients who receive monoclonal antibody treatments wait at least 90 days before being vaccinated.  Currently, there are no data on the safety and efficacy of mRNA COVID-19 vaccines in persons who received monoclonal antibodies or convalescent plasma as part of COVID-19 treatment. Based on the estimated half-life of such therapies as well as evidence suggesting that reinfection is uncommon in the 90 days after initial infection, vaccination should be deferred for at least 90 days, as a precautionary measure until additional information becomes available, to avoid interference of the antibody treatment with vaccine-induced immune responses. If you have any questions or concerns after the infusion please call the Advanced Practice Provider on call at [phone]. This number is ONLY intended for your use regarding questions or concerns about the infusion post-treatment side-effects.  Please do not provide this number to others for use. For return to work notes please contact your primary care provider.   If someone you know is interested in receiving treatment please have them call the COVID hotline at [phone].   

## 2020-07-19 NOTE — Progress Notes (Signed)
Patient reviewed Fact Sheet for Patients, Parents, and Caregivers for Emergency Use Authorization (EUA) of Sotrovimab for the Treatment of Coronavirus. Patient also reviewed and is agreeable to the estimated cost of treatment. Patient is agreeable to proceed.   

## 2020-09-24 ENCOUNTER — Ambulatory Visit: Payer: BC Managed Care – PPO | Admitting: Neurology

## 2020-09-29 ENCOUNTER — Other Ambulatory Visit: Payer: Self-pay | Admitting: Neurology

## 2020-10-01 NOTE — Telephone Encounter (Signed)
Telephone call to pt, Pt states right now she doing okay with her headaches. She will call us later, Right now she is focus on her husband.   No need for a refill

## 2020-10-01 NOTE — Telephone Encounter (Signed)
Have her first schedule a follow up (so it is on the books) and then we can refill

## 2020-10-03 NOTE — Telephone Encounter (Signed)
I am not asking that she follow up with me now.  I would just like her to make a follow up appointment (6 months would be fine).  It's just that I want to know that she has a follow up within a year's timeframe since I last saw her because I don't refill medications beyond a year of being seen.  I don't want her to be without the medication if it may be helping.

## 2020-10-24 DIAGNOSIS — N1832 Chronic kidney disease, stage 3b: Secondary | ICD-10-CM | POA: Diagnosis not present

## 2020-10-24 DIAGNOSIS — Z23 Encounter for immunization: Secondary | ICD-10-CM | POA: Diagnosis not present

## 2020-10-24 DIAGNOSIS — A6000 Herpesviral infection of urogenital system, unspecified: Secondary | ICD-10-CM | POA: Diagnosis not present

## 2020-10-24 DIAGNOSIS — Z0000 Encounter for general adult medical examination without abnormal findings: Secondary | ICD-10-CM | POA: Diagnosis not present

## 2020-10-24 DIAGNOSIS — K219 Gastro-esophageal reflux disease without esophagitis: Secondary | ICD-10-CM | POA: Diagnosis not present

## 2020-10-24 DIAGNOSIS — F324 Major depressive disorder, single episode, in partial remission: Secondary | ICD-10-CM | POA: Diagnosis not present

## 2020-10-24 DIAGNOSIS — E039 Hypothyroidism, unspecified: Secondary | ICD-10-CM | POA: Diagnosis not present

## 2020-10-24 DIAGNOSIS — R739 Hyperglycemia, unspecified: Secondary | ICD-10-CM | POA: Diagnosis not present

## 2020-10-24 DIAGNOSIS — E785 Hyperlipidemia, unspecified: Secondary | ICD-10-CM | POA: Diagnosis not present

## 2020-10-24 DIAGNOSIS — G43809 Other migraine, not intractable, without status migrainosus: Secondary | ICD-10-CM | POA: Diagnosis not present

## 2020-10-24 DIAGNOSIS — R9431 Abnormal electrocardiogram [ECG] [EKG]: Secondary | ICD-10-CM | POA: Diagnosis not present

## 2020-10-24 DIAGNOSIS — J45909 Unspecified asthma, uncomplicated: Secondary | ICD-10-CM | POA: Diagnosis not present

## 2020-11-16 NOTE — Progress Notes (Signed)
Cardiology Office Note:   Date:  11/18/2020  NAME:  [name]    MRN: [id number] DOB:  Dec 09, 1955   PCP:  Elias Else, MD  Cardiologist:  No primary care provider on file.  Electrophysiologist:  None   Referring MD: Wilfrid Lund, PA   Chief Complaint  Patient presents with  . Shortness of Breath    History of Present Illness:   [name] is a 65 y.o. female with a hx of HLD, depression who is being seen today for the evaluation of shortness of breath at the request of Elias Else, MD.  She presents for evaluation of 1 year of intermittent chest pain or shortness of breath.  She reports heavy exertion gets her profoundly short of breath.  She also describes intermittent episodes of left-sided chest pressure.  This can occur with exertion as well.  She reports she can have shortness of breath on some days and chest pressure on other days.  She can have symptoms of chest pressure at rest.  Symptoms of shortness of breath mainly occur with exertion. She reports she is very stressed.  Apparently her mother has recently moved in with she and her husband.  Her husband is also a patient of mine who recently had mitral valve surgery.  She reports stress level is very high and sleep is poor.  She denies any lower extremity edema.  She reports no rapid heartbeat sensation.  Her EKG in office demonstrates sinus rhythm with no acute ischemic changes or evidence of infarction.  There are no murmurs on examination.  Her medical history is significant for obesity with a BMI of 33.  She does have CKD stage III.  Unclear etiology.  No high blood pressure.  She also has hypothyroidism.  Psychiatric medications have been recently adjusted.  Of note, she does report chest pressure in the office today.  It is reassuring that her EKG is very normal.  She is a never smoker.  She operation.  No drug use is reported.  She is married.  She has 3 children.  She is a retired IT consultant.  Family history is  significant for heart disease in her mother as well as mother's side of the family.  Problem List 1. CKD 2. Hypothyroidism  3. HLD -T chol 139, HDL 60, LDL 57, TG 126  Past Medical History: Past Medical History:  Diagnosis Date  . Acid reflux   . Apocrine metaplasia of breast   . Asthma   . BV (bacterial vaginosis)   . Depression    h/o  . Endometriosis    h/o  . HSV-1 (herpes simplex virus 1) infection   . Hyperlipidemia   . Hypothyroid   . Kidney disease   . Obese   . Seasonal allergies     Past Surgical History: Past Surgical History:  Procedure Laterality Date  . ABDOMINAL HYSTERECTOMY    . CESAREAN SECTION     x3  . dental work     cap placed 02-02-16  . LEG SURGERY     had non cancerous tumors removed from left leg  . SINUS ENDO W/FUSION     x3  . TUBAL LIGATION    . WISDOM TOOTH EXTRACTION      Current Medications: Current Meds  Medication Sig  . ascorbic acid (VITAMIN C) 500 MG tablet Take 500 mg by mouth daily.  Christie Beckers HFA 100 MCG/ACT AERO Inhale 100 mcg into the lungs as needed.  Marland Kitchen  azelastine (ASTELIN) 0.1 % nasal spray Place 1 spray into both nostrils 2 (two) times daily as needed for allergies. Reported on 01/23/2016  . buPROPion (WELLBUTRIN XL) 150 MG 24 hr tablet Take 1 tablet by mouth every morning.  . Cholecalciferol 50 MCG (2000 UT) CAPS Take 50 mcg by mouth daily.  . cyclobenzaprine (FLEXERIL) 10 MG tablet Take 1 tablet by mouth daily as needed.  Marland Kitchen ipratropium (ATROVENT) 0.06 % nasal spray ipratropium bromide 42 mcg (0.06 %) nasal spray  . levothyroxine (SYNTHROID) 75 MCG tablet 1 tablet in the morning on an empty stomach  . meclizine (ANTIVERT) 25 MG tablet Take 25 mg by mouth every 6 (six) hours as needed.  . Multiple Vitamins-Minerals (MULTIVITAMINS THER. W/MINERALS) TABS Take 1 tablet by mouth daily.  . nortriptyline (PAMELOR) 50 MG capsule Take 50 mg by mouth daily.  . Omega-3 Fatty Acids (OMEGA 3 PO) Take 1 tablet by mouth daily.  Marland Kitchen  omeprazole (PRILOSEC) 40 MG capsule Take 40 mg by mouth daily.  . ondansetron (ZOFRAN) 8 MG tablet Take 8 mg by mouth as needed.  . polyvinyl alcohol (LIQUIFILM TEARS) 1.4 % ophthalmic solution Place 1 drop into both eyes daily as needed for dry eyes.  . rizatriptan (MAXALT-MLT) 10 MG disintegrating tablet Take 10 mg by mouth daily as needed.  . rosuvastatin (CRESTOR) 40 MG tablet Take 40 mg by mouth daily.  Marland Kitchen scopolamine (TRANSDERM-SCOP) 1 MG/3DAYS 1 patch to skin behind the ear as needed  . valACYclovir (VALTREX) 500 MG tablet Take 500 mg by mouth as needed.  . [DISCONTINUED] buPROPion (WELLBUTRIN XL) 300 MG 24 hr tablet Take 300 mg by mouth daily.  . [DISCONTINUED] levothyroxine (SYNTHROID, LEVOTHROID) 88 MCG tablet Take 75 mcg by mouth every morning. Monday-Saturday.  . [DISCONTINUED] nortriptyline (PAMELOR) 10 MG capsule TAKE 1 CAPSULE AT BEDTIME FOR ONE WEEK, THEN 2 CAPSULES AT BEDTIME     Allergies:    Erythromycin, Amoxicillin-pot clavulanate, and Darvocet [propoxyphene n-acetaminophen]   Social History: Social History   Socioeconomic History  . Marital status: Married    Spouse name: Not on file  . Number of children: 3  . Years of education: Not on file  . Highest education level: Not on file  Occupational History  . Occupation: Scientist, physiological: [employer]  Tobacco Use  . Smoking status: Never Smoker  . Smokeless tobacco: Never Used  Substance and Sexual Activity  . Alcohol use: Yes    Comment: socially  . Drug use: No  . Sexual activity: Not on file    Comment: btl  Other Topics Concern  . Not on file  Social History Narrative   Right handed   Lives husband two story with basement   Social Determinants of Health   Financial Resource Strain: Not on file  Food Insecurity: Not on file  Transportation Needs: Not on file  Physical Activity: Not on file  Stress: Not on file  Social Connections: Not on file     Family History: The patient's family  history includes Asthma in her mother; Cancer in her maternal aunt and maternal uncle; Colon cancer in her paternal uncle; Diabetes in her maternal grandmother, paternal grandfather, and paternal grandmother; Emphysema in her maternal uncle; Heart disease in her brother and maternal grandmother; Mitral valve prolapse in her brother and mother; Stroke in an other family member; Thyroid disease in her maternal grandmother and mother. There is no history of Stomach cancer or Rectal cancer.  ROS:  All other ROS reviewed and negative. Pertinent positives noted in the HPI.     EKGs/Labs/Other Studies Reviewed:   The following studies were personally reviewed by me today:  EKG:  EKG is ordered today.  The ekg ordered today demonstrates normal sinus rhythm, heart rate 86, no acute ischemic changes or evidence of infarction, and was personally reviewed by me.   Recent Labs: No results found for requested labs within last 8760 hours.   Recent Lipid Panel    Component Value Date/Time   CHOL  03/02/2007 0410    197        ATP III CLASSIFICATION:  <200     mg/dL   Desirable  161-096  mg/dL   Borderline High  >=045    mg/dL   High   TRIG 409 (H) 81/19/1478 0410   HDL 41 03/02/2007 0410   CHOLHDL 4.8 03/02/2007 0410   VLDL 49 (H) 03/02/2007 0410   LDLCALC (H) 03/02/2007 0410    107        Total Cholesterol/HDL:CHD Risk Coronary Heart Disease Risk Table                     Men   Women  1/2 Average Risk   3.4   3.3    Physical Exam:   VS:  BP 120/86 (BP Location: Left Arm, Patient Position: Sitting)   Pulse 86   Ht  (1.676 m)   Wt 203 lb 9.6 oz (92.4 kg)   SpO2 98%   BMI 32.86 kg/m    Wt Readings from Last 3 Encounters:  11/18/20 203 lb 9.6 oz (92.4 kg)  03/24/20 189 lb 3.2 oz (85.8 kg)  02/06/16 183 lb (83 kg)    General: Well nourished, well developed, in no acute distress Head: Atraumatic, normal size  Eyes: PEERLA, EOMI  Neck: Supple, no JVD Endocrine: No  thryomegaly Cardiac: Normal S1, S2; RRR; no murmurs, rubs, or gallops Lungs: Clear to auscultation bilaterally, no wheezing, rhonchi or rales  Abd: Soft, nontender, no hepatomegaly  Ext: No edema, pulses 2+ Musculoskeletal: No deformities, BUE and BLE strength normal and equal Skin: Warm and dry, no rashes   Neuro: Alert and oriented to person, place, time, and situation, CNII-XII grossly intact, no focal deficits  Psych: Normal mood and affect   ASSESSMENT:   [name] is a 65 y.o. female who presents for the following: 1. SOB (shortness of breath) on exertion   2. Chest pain of uncertain etiology   3. Mixed hyperlipidemia   4. Obesity (BMI 30-39.9)     PLAN:   1. SOB (shortness of breath) on exertion 2. Chest pain of uncertain etiology -Chest pain or shortness of breath for 1 year.  Exertional but does occur with rest.  Atypical.  EKG in the office demonstrates sinus rhythm with no acute ischemic changes or evidence of infarction.  CAD risk factors include CKD and obesity.  She has no history of high blood pressure.  Most recent lipid profile shows an LDL cholesterol 57.  She is on a statin.  She is very stressed.  Overall I suspect her symptoms are stress related.  We will make sure there is no cardiac pathology here.  I would like to check a BNP, echocardiogram and exercise nuclear medicine stress test.  We will combine this with a coronary calcium score for further risk ratification.  I will plan to see her back in 2 months after the above testing.  3. Mixed hyperlipidemia -Most recent LDL cholesterol 57.  Continue statin.  4. Obesity (BMI 30-39.9) -Diet and exercise recommended.  Shared Decision Making/Informed Consent The risks [chest pain, shortness of breath, cardiac arrhythmias, dizziness, blood pressure fluctuations, myocardial infarction, stroke/transient ischemic attack, nausea, vomiting, allergic reaction, radiation exposure, metallic taste sensation and  life-threatening complications (estimated to be 1 in 10,000)], benefits (risk stratification, diagnosing coronary artery disease, treatment guidance) and alternatives of a nuclear stress test were discussed in detail with Ms. Luan PullingDunlap and she agrees to proceed.  Disposition: Return in about 2 months (around 01/18/2021).  Medication Adjustments/Labs and Tests Ordered: Current medicines are reviewed at length with the patient today.  Concerns regarding medicines are outlined above.  Orders Placed This Encounter  Procedures  . CT CARDIAC SCORING (SELF PAY ONLY)  . Brain natriuretic peptide  . Cardiac Stress Test: Informed Consent Details: Physician/Practitioner Attestation; Transcribe to consent form and obtain patient signature  . MYOCARDIAL PERFUSION IMAGING  . EKG 12-Lead  . ECHOCARDIOGRAM COMPLETE   No orders of the defined types were placed in this encounter.   Patient Instructions  Medication Instructions:  The current medical regimen is effective;  continue present plan and medications.  *If you need a refill on your cardiac medications before your next appointment, please call your pharmacy*   Lab Work: BNP today  If you have labs (blood work) drawn today and your tests are completely normal, you will receive your results only by: Marland Kitchen. MyChart Message (if you have MyChart) OR . A paper copy in the mail If you have any lab test that is abnormal or we need to change your treatment, we will call you to review the results.   Testing/Procedures: Your physician has requested that you have an Exercise Myoview. A cardiac stress test is a cardiological test that measures the heart's ability to respond to external stress in a controlled clinical environment. The stress response is induced by exercise (exercise-treadmill). For further information please visit [email or web address]. If you have questions or concerns about your appointment, you can call the Nuclear Lab at [phone].     Echocardiogram - Your physician has requested that you have an echocardiogram. Echocardiography is a painless test that uses sound waves to create images of your heart. It provides your doctor with information about the size and shape of your heart and how well your heart's chambers and valves are working. This procedure takes approximately one hour. There are no restrictions for this procedure. [facility] - [address], [address].  CALCIUM SCORE  Follow-Up: At [facility], you and your health needs are our priority.  As part of our continuing mission to provide you with exceptional heart care, we have created designated Provider Care Teams.  These Care Teams include your primary Cardiologist (physician) and Advanced Practice Providers (APPs -  Physician Assistants and Nurse Practitioners) who all work together to provide you with the care you need, when you need it.  We recommend signing up for the patient portal called "MyChart".  Sign up information is provided on this After Visit Summary.  MyChart is used to connect with patients for Virtual Visits (Telemedicine).  Patients are able to view lab/test results, encounter notes, upcoming appointments, etc.  Non-urgent messages can be sent to your provider as well.   To learn more about what you can do with MyChart, go to [email or web address].    Your next appointment:   2 month(s)  The  format for your next appointment:   In Person  Provider:   Lennie Odor, MD          Signed, Lenna Gilford. Flora Lipps, MD, [facility]  [facility]  [address] [place], [place] [address] [phone]  11/18/2020 3:49 PM

## 2020-11-18 ENCOUNTER — Other Ambulatory Visit: Payer: Self-pay

## 2020-11-18 ENCOUNTER — Encounter: Payer: Self-pay | Admitting: Cardiovascular Disease

## 2020-11-18 ENCOUNTER — Ambulatory Visit: Payer: PPO | Admitting: Cardiovascular Disease

## 2020-11-18 VITALS — BP 120/86 | HR 86 | Ht 66.0 in | Wt 203.6 lb

## 2020-11-18 DIAGNOSIS — E669 Obesity, unspecified: Secondary | ICD-10-CM | POA: Diagnosis not present

## 2020-11-18 DIAGNOSIS — R0602 Shortness of breath: Secondary | ICD-10-CM

## 2020-11-18 DIAGNOSIS — E782 Mixed hyperlipidemia: Secondary | ICD-10-CM

## 2020-11-18 DIAGNOSIS — R079 Chest pain, unspecified: Secondary | ICD-10-CM

## 2020-11-18 NOTE — Patient Instructions (Signed)
Medication Instructions:  The current medical regimen is effective;  continue present plan and medications.  *If you need a refill on your cardiac medications before your next appointment, please call your pharmacy*   Lab Work: BNP today  If you have labs (blood work) drawn today and your tests are completely normal, you will receive your results only by: Marland Kitchen MyChart Message (if you have MyChart) OR . A paper copy in the mail If you have any lab test that is abnormal or we need to change your treatment, we will call you to review the results.   Testing/Procedures: Your physician has requested that you have an Exercise Myoview. A cardiac stress test is a cardiological test that measures the heart's ability to respond to external stress in a controlled clinical environment. The stress response is induced by exercise (exercise-treadmill). For further information please visit [email or web address] If you have questions or concerns about your appointment, you can call the Nuclear Lab at [phone].    Echocardiogram - Your physician has requested that you have an echocardiogram. Echocardiography is a painless test that uses sound waves to create images of your heart. It provides your doctor with information about the size and shape of your heart and how well your heart's chambers and valves are working. This procedure takes approximately one hour. There are no restrictions for this procedure. This will be performed at our [facility] location - [address], [address].  CALCIUM SCORE  Follow-Up: At [facility], you and your health needs are our priority.  As part of our continuing mission to provide you with exceptional heart care, we have created designated Provider Care Teams.  These Care Teams include your primary Cardiologist (physician) and Advanced Practice Providers (APPs -  Physician Assistants and Nurse Practitioners) who all work together to provide you with the care you need, when  you need it.  We recommend signing up for the patient portal called "MyChart".  Sign up information is provided on this After Visit Summary.  MyChart is used to connect with patients for Virtual Visits (Telemedicine).  Patients are able to view lab/test results, encounter notes, upcoming appointments, etc.  Non-urgent messages can be sent to your provider as well.   To learn more about what you can do with MyChart, go to [email or web address].    Your next appointment:   2 month(s)  The format for your next appointment:   In Person  Provider:   Lennie Odor, MD

## 2020-11-19 LAB — BRAIN NATRIURETIC PEPTIDE: BNP: 30.6 pg/mL (ref 0.0–100.0)

## 2020-11-20 DIAGNOSIS — G5601 Carpal tunnel syndrome, right upper limb: Secondary | ICD-10-CM | POA: Diagnosis not present

## 2020-11-20 DIAGNOSIS — M654 Radial styloid tenosynovitis [de Quervain]: Secondary | ICD-10-CM | POA: Diagnosis not present

## 2020-11-26 ENCOUNTER — Telehealth (HOSPITAL_COMMUNITY): Payer: Self-pay | Admitting: *Deleted

## 2020-11-26 DIAGNOSIS — M25531 Pain in right wrist: Secondary | ICD-10-CM | POA: Diagnosis not present

## 2020-11-26 NOTE — Telephone Encounter (Signed)
Patient given detailed instructions per Myocardial Perfusion Study Information Sheet for the test on 12/01/20 at 8:00. Patient notified to arrive 15 minutes early and that it is imperative to arrive on time for appointment to keep from having the test rescheduled.  If you need to cancel or reschedule your appointment, please call the office within 24 hours of your appointment. . Patient verbalized understanding.[name]

## 2020-11-27 ENCOUNTER — Other Ambulatory Visit (HOSPITAL_COMMUNITY): Payer: PPO

## 2020-12-01 ENCOUNTER — Other Ambulatory Visit: Payer: Self-pay

## 2020-12-01 ENCOUNTER — Ambulatory Visit (HOSPITAL_COMMUNITY): Payer: PPO | Attending: Cardiology

## 2020-12-01 DIAGNOSIS — R079 Chest pain, unspecified: Secondary | ICD-10-CM

## 2020-12-01 LAB — MYOCARDIAL PERFUSION IMAGING
Estimated workload: 7 METS
Exercise duration (min): 5 min
Exercise duration (sec): 31 s
LV dias vol: 55 mL (ref 46–106)
LV sys vol: 13 mL
MPHR: 155 {beats}/min
Peak HR: 137 {beats}/min
Percent HR: 89 %
RPE: 19
Rest HR: 75 {beats}/min
SDS: 1
SRS: 1
SSS: 2
TID: 0.89

## 2020-12-01 MED ORDER — TECHNETIUM TC 99M TETROFOSMIN IV KIT
30.1000 | PACK | Freq: Once | INTRAVENOUS | Status: AC | PRN
  Administered 2020-12-01: 30.1 via INTRAVENOUS
  Filled 2020-12-01: qty 31

## 2020-12-01 MED ORDER — TECHNETIUM TC 99M TETROFOSMIN IV KIT
10.9000 | PACK | Freq: Once | INTRAVENOUS | Status: AC | PRN
  Administered 2020-12-01: 10.9 via INTRAVENOUS
  Filled 2020-12-01: qty 11

## 2020-12-25 ENCOUNTER — Ambulatory Visit (HOSPITAL_COMMUNITY): Payer: PPO | Attending: Cardiology

## 2020-12-25 ENCOUNTER — Ambulatory Visit (INDEPENDENT_AMBULATORY_CARE_PROVIDER_SITE_OTHER)
Admission: RE | Admit: 2020-12-25 | Discharge: 2020-12-25 | Disposition: A | Discharge status: Home / Self Care | Payer: Self-pay | Source: Ambulatory Visit | Attending: Cardiovascular Disease | Admitting: Cardiovascular Disease

## 2020-12-25 ENCOUNTER — Other Ambulatory Visit: Payer: Self-pay

## 2020-12-25 DIAGNOSIS — R079 Chest pain, unspecified: Secondary | ICD-10-CM

## 2020-12-25 DIAGNOSIS — R0602 Shortness of breath: Secondary | ICD-10-CM

## 2020-12-25 LAB — ECHOCARDIOGRAM COMPLETE
Area-P 1/2: 3.03 cm2
S' Lateral: 2.3 cm

## 2021-01-03 DIAGNOSIS — W540XXA Bitten by dog, initial encounter: Secondary | ICD-10-CM | POA: Diagnosis not present

## 2021-01-03 DIAGNOSIS — S51831A Puncture wound without foreign body of right forearm, initial encounter: Secondary | ICD-10-CM | POA: Diagnosis not present

## 2021-01-07 ENCOUNTER — Ambulatory Visit: Payer: PPO | Admitting: Cardiovascular Disease

## 2021-01-27 DIAGNOSIS — F324 Major depressive disorder, single episode, in partial remission: Secondary | ICD-10-CM | POA: Diagnosis not present

## 2021-01-27 DIAGNOSIS — G43809 Other migraine, not intractable, without status migrainosus: Secondary | ICD-10-CM | POA: Diagnosis not present

## 2021-01-27 DIAGNOSIS — R635 Abnormal weight gain: Secondary | ICD-10-CM | POA: Diagnosis not present

## 2021-01-27 DIAGNOSIS — F419 Anxiety disorder, unspecified: Secondary | ICD-10-CM | POA: Diagnosis not present

## 2021-02-20 ENCOUNTER — Emergency Department (HOSPITAL_BASED_OUTPATIENT_CLINIC_OR_DEPARTMENT_OTHER)
Admission: EM | Admit: 2021-02-20 | Discharge: 2021-02-20 | Disposition: A | Discharge status: Home / Self Care | Payer: PPO | Attending: Emergency Medicine | Admitting: Emergency Medicine

## 2021-02-20 ENCOUNTER — Encounter (HOSPITAL_BASED_OUTPATIENT_CLINIC_OR_DEPARTMENT_OTHER): Payer: Self-pay | Admitting: Emergency Medicine

## 2021-02-20 ENCOUNTER — Other Ambulatory Visit: Payer: Self-pay

## 2021-02-20 ENCOUNTER — Emergency Department (HOSPITAL_BASED_OUTPATIENT_CLINIC_OR_DEPARTMENT_OTHER): Payer: PPO | Admitting: Radiology

## 2021-02-20 DIAGNOSIS — Z20822 Contact with and (suspected) exposure to covid-19: Secondary | ICD-10-CM | POA: Insufficient documentation

## 2021-02-20 DIAGNOSIS — R059 Cough, unspecified: Secondary | ICD-10-CM | POA: Diagnosis not present

## 2021-02-20 DIAGNOSIS — E039 Hypothyroidism, unspecified: Secondary | ICD-10-CM | POA: Diagnosis not present

## 2021-02-20 DIAGNOSIS — J45909 Unspecified asthma, uncomplicated: Secondary | ICD-10-CM | POA: Insufficient documentation

## 2021-02-20 DIAGNOSIS — Z79899 Other long term (current) drug therapy: Secondary | ICD-10-CM | POA: Insufficient documentation

## 2021-02-20 DIAGNOSIS — Z7951 Long term (current) use of inhaled steroids: Secondary | ICD-10-CM | POA: Diagnosis not present

## 2021-02-20 DIAGNOSIS — B349 Viral infection, unspecified: Secondary | ICD-10-CM

## 2021-02-20 DIAGNOSIS — R079 Chest pain, unspecified: Secondary | ICD-10-CM | POA: Diagnosis not present

## 2021-02-20 DIAGNOSIS — R0789 Other chest pain: Secondary | ICD-10-CM | POA: Diagnosis not present

## 2021-02-20 LAB — CBC
HCT: 37.8 % (ref 36.0–46.0)
Hemoglobin: 12.2 g/dL (ref 12.0–15.0)
MCH: 27.6 pg (ref 26.0–34.0)
MCHC: 32.3 g/dL (ref 30.0–36.0)
MCV: 85.5 fL (ref 80.0–100.0)
Platelets: 165 10*3/uL (ref 150–400)
RBC: 4.42 MIL/uL (ref 3.87–5.11)
RDW: 13.2 % (ref 11.5–15.5)
WBC: 5.8 10*3/uL (ref 4.0–10.5)
nRBC: 0 % (ref 0.0–0.2)

## 2021-02-20 LAB — BASIC METABOLIC PANEL
Anion gap: 11 (ref 5–15)
BUN: 20 mg/dL (ref 8–23)
CO2: 23 mmol/L (ref 22–32)
Calcium: 8.9 mg/dL (ref 8.9–10.3)
Chloride: 103 mmol/L (ref 98–111)
Creatinine, Ser: 1.28 mg/dL — ABNORMAL HIGH (ref 0.44–1.00)
GFR, Estimated: 46 mL/min — ABNORMAL LOW (ref >60)
Glucose, Bld: 120 mg/dL — ABNORMAL HIGH (ref 70–99)
Potassium: 3.7 mmol/L (ref 3.5–5.1)
Sodium: 137 mmol/L (ref 135–145)

## 2021-02-20 LAB — RESP PANEL BY RT-PCR (FLU A&B, COVID) ARPGX2
Influenza A by PCR: NEGATIVE
Influenza B by PCR: NEGATIVE
SARS Coronavirus 2 by RT PCR: NEGATIVE

## 2021-02-20 LAB — TROPONIN I (HIGH SENSITIVITY)
Troponin I (High Sensitivity): 2 ng/L (ref <18)
Troponin I (High Sensitivity): 3 ng/L (ref <18)

## 2021-02-20 MED ORDER — HYDROCODONE BIT-HOMATROP MBR 5-1.5 MG/5ML PO SOLN: 5.0000 mL | Freq: Four times a day (QID) | ORAL | 0 refills | Status: DC | PRN

## 2021-02-20 MED ORDER — ONDANSETRON 4 MG PO TBDP: 4.0000 mg | ORAL_TABLET | ORAL | 0 refills | Status: DC | PRN

## 2021-02-20 MED ORDER — ACETAMINOPHEN 500 MG PO TABS
1000.0000 mg | ORAL_TABLET | Freq: Once | ORAL | Status: AC
  Administered 2021-02-20: 1000 mg via ORAL
  Filled 2021-02-20: qty 2

## 2021-02-20 MED ORDER — BENZONATATE 100 MG PO CAPS
200.0000 mg | ORAL_CAPSULE | Freq: Once | ORAL | Status: AC
  Administered 2021-02-20: 200 mg via ORAL
  Filled 2021-02-20: qty 2

## 2021-02-20 MED ORDER — ALBUTEROL SULFATE HFA 108 (90 BASE) MCG/ACT IN AERS
2.0000 | INHALATION_SPRAY | Freq: Once | RESPIRATORY_TRACT | Status: AC
  Administered 2021-02-20: 2 via RESPIRATORY_TRACT
  Filled 2021-02-20: qty 6.7

## 2021-02-20 MED ORDER — BENZONATATE 100 MG PO CAPS: 200.0000 mg | ORAL_CAPSULE | Freq: Three times a day (TID) | ORAL | 0 refills | Status: DC | PRN

## 2021-02-20 MED ORDER — ACETAMINOPHEN 500 MG PO TABS: 1000.0000 mg | ORAL_TABLET | Freq: Four times a day (QID) | ORAL | 0 refills | Status: DC | PRN

## 2021-02-20 NOTE — ED Notes (Signed)
Pt ordered flutter per protocol assessment by RT. RT instructed pt on proper use/technique of flutter/CPT device. Pt able to demonstrate proper use and understanding of device.

## 2021-02-20 NOTE — Discharge Instructions (Addendum)
1.  At this time your symptoms are consistent with a viral bronchitis.  Your COVID test was negative.  However, your symptoms are suggestive of COVID.  Continue to be very careful isolating yourself and wearing your mask.  You may consider repeating a COVID test in the next 24 to 48 hours. 2.  Take extra strength Tylenol every 6 hours for body aches and fever.  Stay hydrated.  Take albuterol 2 puffs with a spacer every 4 hours for wheeze or cough.  May also take Tessalon Perles and Hycodan syrup as prescribed to help with cough.  These all help treat symptoms.  If they are not helpful, you do not have to take them.  You have been prescribed Zofran to take for nausea. 3.  Return to the emergency department if you develop difficulty breathing, difficulty taking fluids, repeat vomiting not controlled by Zofran or other concerning symptoms.

## 2021-02-20 NOTE — ED Provider Notes (Signed)
MEDCENTER [facility] EMERGENCY DEPT Provider Note   CSN: [id number] Arrival date & time: 02/20/21  1528     History Chief Complaint  Patient presents with   Cough    [name] is a 65 y.o. female.  HPI Patient reports she started developing a cough and some general fatigue in the past 1 to 2 days.  Symptoms got worse and cough has become harsh.  She has not had any sputum production.  She reports she does feel like she has some congestion in her chest but cannot cough it out.  She reports has had body aches and chills.  Patient reports last night she also had generalized headache.  Patient reports she has had all of her COVID vaccines.  She has not had any known contacts.  She took a home test that was negative    Past Medical History:  Diagnosis Date   Acid reflux    Apocrine metaplasia of breast    Asthma    BV (bacterial vaginosis)    Depression    h/o   Endometriosis    h/o   HSV-1 (herpes simplex virus 1) infection    Hyperlipidemia    Hypothyroid    Kidney disease    Obese    Seasonal allergies     Patient Active Problem List   Diagnosis Date Noted   RUQ abdominal pain 05/14/2014   Abdominal pain, epigastric 05/14/2014   Nausea without vomiting 05/14/2014   Hypothyroidism 05/14/2014   GERD (gastroesophageal reflux disease) 05/14/2014   Asthma, chronic 05/14/2014    Past Surgical History:  Procedure Laterality Date   ABDOMINAL HYSTERECTOMY     CESAREAN SECTION     x3   dental work     cap placed 02-02-16   LEG SURGERY     had non cancerous tumors removed from left leg   SINUS ENDO W/FUSION     x3   TUBAL LIGATION     WISDOM TOOTH EXTRACTION       OB History     Gravida  3   Para  3   Term      Preterm      AB      Living  3      SAB      IAB      Ectopic      Multiple      Live Births              Family History  Problem Relation Age of Onset   Cancer Maternal Uncle    Emphysema Maternal Uncle    Stroke  Other    Asthma Mother    Mitral valve prolapse Mother    Thyroid disease Mother    Heart disease Brother    Mitral valve prolapse Brother    Cancer Maternal Aunt        uterine and liver and kidney   Diabetes Maternal Grandmother    Heart disease Maternal Grandmother    Thyroid disease Maternal Grandmother    Diabetes Paternal Grandmother    Diabetes Paternal Grandfather    Colon cancer Paternal Uncle    Stomach cancer Neg Hx    Rectal cancer Neg Hx     Social History   Tobacco Use   Smoking status: Never   Smokeless tobacco: Never  Substance Use Topics   Alcohol use: Yes    Comment: socially   Drug use: No    Home Medications Prior to Admission  medications   Medication Sig Start Date End Date Taking? Authorizing Provider  acetaminophen (TYLENOL) 500 MG tablet Take 2 tablets (1,000 mg total) by mouth every 6 (six) hours as needed for moderate pain or fever. 02/20/21  Yes Everline Mahaffy, Lebron ConnersMarcy, MD  benzonatate (TESSALON PERLES) 100 MG capsule Take 2 capsules (200 mg total) by mouth 3 (three) times daily as needed for cough. 02/20/21  Yes Ange Puskas, Lebron ConnersMarcy, MD  HYDROcodone bit-homatropine (HYCODAN) 5-1.5 MG/5ML syrup Take 5 mLs by mouth every 6 (six) hours as needed for cough. 02/20/21  Yes Arby BarrettePfeiffer, Kenniel Bergsma, MD  ondansetron (ZOFRAN ODT) 4 MG disintegrating tablet Take 1 tablet (4 mg total) by mouth every 4 (four) hours as needed for nausea or vomiting. 02/20/21  Yes Lakashia Collison, Lebron ConnersMarcy, MD  ascorbic acid (VITAMIN C) 500 MG tablet Take 500 mg by mouth daily.    [provider]  [facility] MCG/ACT AERO Inhale 100 mcg into the lungs as needed. 01/01/20   [provider]  azelastine (ASTELIN) 0.1 % nasal spray Place 1 spray into both nostrils 2 (two) times daily as needed for allergies. Reported on 01/23/2016    [provider]  buPROPion (WELLBUTRIN XL) 150 MG 24 hr tablet Take 1 tablet by mouth every morning. 09/22/20   [provider]  Cholecalciferol 50 MCG  (2000 UT) CAPS Take 50 mcg by mouth daily. 10/01/16   [provider]  cyclobenzaprine (FLEXERIL) 10 MG tablet Take 1 tablet by mouth daily as needed.    [provider]  ipratropium (ATROVENT) 0.06 % nasal spray ipratropium bromide 42 mcg (0.06 %) nasal spray    [provider]  levothyroxine (SYNTHROID) 75 MCG tablet 1 tablet in the morning on an empty stomach    [provider]  meclizine (ANTIVERT) 25 MG tablet Take 25 mg by mouth every 6 (six) hours as needed. 01/01/20   [provider]  Multiple Vitamins-Minerals (MULTIVITAMINS THER. W/MINERALS) TABS Take 1 tablet by mouth daily.    [provider]  nortriptyline (PAMELOR) 50 MG capsule Take 50 mg by mouth daily. 10/29/20   [provider]  Omega-3 Fatty Acids (OMEGA 3 PO) Take 1 tablet by mouth daily.    [provider]  omeprazole (PRILOSEC) 40 MG capsule Take 40 mg by mouth daily. 10/24/20   [provider]  ondansetron (ZOFRAN) 8 MG tablet Take 8 mg by mouth as needed.    [provider]  polyvinyl alcohol (LIQUIFILM TEARS) 1.4 % ophthalmic solution Place 1 drop into both eyes daily as needed for dry eyes.    [provider]  rizatriptan (MAXALT-MLT) 10 MG disintegrating tablet Take 10 mg by mouth daily as needed. 01/01/20   [provider]  rosuvastatin (CRESTOR) 40 MG tablet Take 40 mg by mouth daily. 03/07/20   [provider]  scopolamine (TRANSDERM-SCOP) 1 MG/3DAYS 1 patch to skin behind the ear as needed 10/24/20   [provider]  valACYclovir (VALTREX) 500 MG tablet Take 500 mg by mouth as needed.    [provider]    Allergies    Erythromycin, Amoxicillin-pot clavulanate, and Darvocet [propoxyphene n-acetaminophen]  Review of Systems   Review of Systems 10 systems reviewed and negative except as per HPI Physical Exam Updated Vital Signs BP 125/61   Pulse 80   Temp 99.5 F (37.5 C) (Oral)    Resp 20   Ht 5\' 6"  (1.676 m)   Wt 95.7 kg   SpO2 99%   BMI  34.06 kg/m   Physical Exam Constitutional:      Comments: Alert with clear mental status.  No respiratory distress.  Uncomfortable in appearance  HENT:     Head: Normocephalic and atraumatic.     Mouth/Throat:     Mouth: Mucous membranes are moist.     Pharynx: Oropharynx is clear.  Eyes:     Extraocular Movements: Extraocular movements intact.  Cardiovascular:     Rate and Rhythm: Normal rate and regular rhythm.  Pulmonary:     Effort: Pulmonary effort is normal.     Breath sounds: Normal breath sounds.  Abdominal:     General: There is no distension.     Palpations: Abdomen is soft.     Tenderness: There is no abdominal tenderness. There is no guarding.  Musculoskeletal:        General: No swelling or tenderness.     Right lower leg: No edema.     Left lower leg: No edema.  Skin:    General: Skin is warm and dry.  Neurological:     General: No focal deficit present.     Mental Status: She is oriented to person, place, and time.  Psychiatric:        Mood and Affect: Mood normal.    ED Results / Procedures / Treatments   Labs (all labs ordered are listed, but only abnormal results are displayed) Labs Reviewed  BASIC METABOLIC PANEL - Abnormal; Notable for the following components:      Result Value   Glucose, Bld 120 (*)    Creatinine, Ser 1.28 (*)    GFR, Estimated 46 (*)    All other components within normal limits  RESP PANEL BY RT-PCR (FLU A&B, COVID) ARPGX2  CBC  TROPONIN I (HIGH SENSITIVITY)  TROPONIN I (HIGH SENSITIVITY)    EKG EKG Interpretation  Date/Time:  Friday February 20 2021 16:00:29 EDT Ventricular Rate:  84 PR Interval:  158 QRS Duration: 80 QT Interval:  374 QTC Calculation: 441 R Axis:   -43 Text Interpretation: Normal sinus rhythm Left axis deviation Possible Anterior infarct , age undetermined Abnormal ECG no sig change from previous. Confirmed by Arby Barrette [phone]) on  02/20/2021 7:49:20 PM  Radiology DG Chest 2 View  Result Date: 02/20/2021 CLINICAL DATA:  Chest pain EXAM: CHEST - 2 VIEW COMPARISON:  2013 FINDINGS: The heart size and mediastinal contours are within normal limits. Both lungs are clear. No pleural effusion or pneumothorax. The visualized skeletal structures are unremarkable. IMPRESSION: No acute process in the chest. Electronically Signed   By: Guadlupe Spanish M.D.   On: 02/20/2021 16:58    Procedures Procedures   Medications Ordered in ED Medications  albuterol (VENTOLIN HFA) 108 (90 Base) MCG/ACT inhaler 2 puff (has no administration in time range)  acetaminophen (TYLENOL) tablet 1,000 mg (has no administration in time range)  benzonatate (TESSALON) capsule 200 mg (has no administration in time range)    ED Course  I have reviewed the triage vital signs and the nursing notes.  Pertinent labs & imaging results that were available during my care of the patient were reviewed by me and considered in my medical decision making (see chart for details).    MDM Rules/Calculators/A&P                          Patient has symptoms consistent with a viral bronchitis.  She has some mild URI symptoms, body aches and cough.  Patient is testing negative for COVID.  Symptoms are consistent with COVID.  No signs of a bacterial pneumonia at this point time.  Plan will be for symptomatic treatment.  I do recommend patient continue to be cautious with isolating and mask wearing.  Return precautions reviewed.  Patient to consider repeat testing in 24 to 48 hours. Final Clinical Impression(s) / ED Diagnoses Final diagnoses:  Acute viral syndrome    Rx / DC Orders ED Discharge Orders          Ordered    ondansetron (ZOFRAN ODT) 4 MG disintegrating tablet  Every 4 hours PRN        02/20/21 2203    HYDROcodone bit-homatropine (HYCODAN) 5-1.5 MG/5ML syrup  Every 6 hours PRN        02/20/21 2203    benzonatate (TESSALON PERLES) 100 MG capsule  3 times  daily PRN        02/20/21 2203    acetaminophen (TYLENOL) 500 MG tablet  Every 6 hours PRN        02/20/21 2203             Arby Barrette, MD 02/20/21 2208

## 2021-02-20 NOTE — ED Triage Notes (Signed)
Pt via pov from home with non-productive cough, fever, chest pain since last night. Pt states she took home covid test with negative result this morning. Pt reports that she had a headache last night before the cough started. Pt states she feels "yucky." Pt A&O x4.

## 2021-02-24 ENCOUNTER — Encounter (HOSPITAL_COMMUNITY): Payer: Self-pay | Admitting: Emergency Medicine

## 2021-02-24 ENCOUNTER — Emergency Department (HOSPITAL_COMMUNITY)
Admission: EM | Admit: 2021-02-24 | Discharge: 2021-02-24 | Disposition: A | Discharge status: Home / Self Care | Payer: PPO | Attending: Emergency Medicine | Admitting: Emergency Medicine

## 2021-02-24 ENCOUNTER — Emergency Department (HOSPITAL_COMMUNITY): Payer: PPO

## 2021-02-24 DIAGNOSIS — J189 Pneumonia, unspecified organism: Secondary | ICD-10-CM

## 2021-02-24 DIAGNOSIS — M549 Dorsalgia, unspecified: Secondary | ICD-10-CM | POA: Diagnosis not present

## 2021-02-24 DIAGNOSIS — Z20822 Contact with and (suspected) exposure to covid-19: Secondary | ICD-10-CM | POA: Diagnosis not present

## 2021-02-24 DIAGNOSIS — R059 Cough, unspecified: Secondary | ICD-10-CM | POA: Diagnosis not present

## 2021-02-24 DIAGNOSIS — E039 Hypothyroidism, unspecified: Secondary | ICD-10-CM | POA: Diagnosis not present

## 2021-02-24 DIAGNOSIS — J181 Lobar pneumonia, unspecified organism: Secondary | ICD-10-CM | POA: Diagnosis not present

## 2021-02-24 DIAGNOSIS — Z79899 Other long term (current) drug therapy: Secondary | ICD-10-CM | POA: Diagnosis not present

## 2021-02-24 DIAGNOSIS — J45909 Unspecified asthma, uncomplicated: Secondary | ICD-10-CM | POA: Diagnosis not present

## 2021-02-24 DIAGNOSIS — R079 Chest pain, unspecified: Secondary | ICD-10-CM | POA: Diagnosis not present

## 2021-02-24 DIAGNOSIS — R0789 Other chest pain: Secondary | ICD-10-CM | POA: Diagnosis not present

## 2021-02-24 DIAGNOSIS — R0902 Hypoxemia: Secondary | ICD-10-CM | POA: Diagnosis not present

## 2021-02-24 DIAGNOSIS — R0602 Shortness of breath: Secondary | ICD-10-CM | POA: Diagnosis not present

## 2021-02-24 LAB — BASIC METABOLIC PANEL
Anion gap: 16 — ABNORMAL HIGH (ref 5–15)
BUN: 21 mg/dL (ref 8–23)
CO2: 21 mmol/L — ABNORMAL LOW (ref 22–32)
Calcium: 9.4 mg/dL (ref 8.9–10.3)
Chloride: 99 mmol/L (ref 98–111)
Creatinine, Ser: 1.56 mg/dL — ABNORMAL HIGH (ref 0.44–1.00)
GFR, Estimated: 37 mL/min — ABNORMAL LOW (ref >60)
Glucose, Bld: 101 mg/dL — ABNORMAL HIGH (ref 70–99)
Potassium: 4 mmol/L (ref 3.5–5.1)
Sodium: 136 mmol/L (ref 135–145)

## 2021-02-24 LAB — CBC WITH DIFFERENTIAL/PLATELET
Abs Immature Granulocytes: 0.01 10*3/uL (ref 0.00–0.07)
Basophils Absolute: 0 10*3/uL (ref 0.0–0.1)
Basophils Relative: 1 %
Eosinophils Absolute: 0.1 10*3/uL (ref 0.0–0.5)
Eosinophils Relative: 3 %
HCT: 40.6 % (ref 36.0–46.0)
Hemoglobin: 13.1 g/dL (ref 12.0–15.0)
Immature Granulocytes: 0 %
Lymphocytes Relative: 24 %
Lymphs Abs: 1 10*3/uL (ref 0.7–4.0)
MCH: 28.1 pg (ref 26.0–34.0)
MCHC: 32.3 g/dL (ref 30.0–36.0)
MCV: 86.9 fL (ref 80.0–100.0)
Monocytes Absolute: 0.4 10*3/uL (ref 0.1–1.0)
Monocytes Relative: 10 %
Neutro Abs: 2.6 10*3/uL (ref 1.7–7.7)
Neutrophils Relative %: 62 %
Platelets: 167 10*3/uL (ref 150–400)
RBC: 4.67 MIL/uL (ref 3.87–5.11)
RDW: 13.6 % (ref 11.5–15.5)
WBC: 4.2 10*3/uL (ref 4.0–10.5)
nRBC: 0 % (ref 0.0–0.2)

## 2021-02-24 LAB — RESP PANEL BY RT-PCR (FLU A&B, COVID) ARPGX2
Influenza A by PCR: NEGATIVE
Influenza B by PCR: NEGATIVE
SARS Coronavirus 2 by RT PCR: NEGATIVE

## 2021-02-24 LAB — LACTIC ACID, PLASMA: Lactic Acid, Venous: 1.7 mmol/L (ref 0.5–1.9)

## 2021-02-24 LAB — TROPONIN I (HIGH SENSITIVITY): Troponin I (High Sensitivity): 4 ng/L (ref <18)

## 2021-02-24 LAB — BRAIN NATRIURETIC PEPTIDE: B Natriuretic Peptide: 14.2 pg/mL (ref 0.0–100.0)

## 2021-02-24 MED ORDER — MORPHINE SULFATE (PF) 4 MG/ML IV SOLN
4.0000 mg | Freq: Once | INTRAVENOUS | Status: AC
  Administered 2021-02-24: 4 mg via INTRAVENOUS
  Filled 2021-02-24: qty 1

## 2021-02-24 MED ORDER — ONDANSETRON HCL 4 MG/2ML IJ SOLN
4.0000 mg | Freq: Once | INTRAMUSCULAR | Status: AC
Start: 2021-02-24 — End: 2021-02-24
  Administered 2021-02-24: 4 mg via INTRAVENOUS
  Filled 2021-02-24: qty 2

## 2021-02-24 MED ORDER — SODIUM CHLORIDE 0.9 % IV BOLUS
1000.0000 mL | Freq: Once | INTRAVENOUS | Status: AC
  Administered 2021-02-24: 1000 mL via INTRAVENOUS

## 2021-02-24 MED ORDER — HYDROCOD POLST-CPM POLST ER 10-8 MG/5ML PO SUER: 5.0000 mL | Freq: Two times a day (BID) | ORAL | 0 refills | Status: DC | PRN

## 2021-02-24 MED ORDER — SODIUM CHLORIDE 0.9 % IV SOLN
1.0000 g | Freq: Once | INTRAVENOUS | Status: AC
  Administered 2021-02-24: 1 g via INTRAVENOUS
  Filled 2021-02-24: qty 10

## 2021-02-24 MED ORDER — DOXYCYCLINE HYCLATE 100 MG PO CAPS: 100.0000 mg | ORAL_CAPSULE | Freq: Two times a day (BID) | ORAL | 0 refills | Status: DC

## 2021-02-24 MED ORDER — IOHEXOL 350 MG/ML SOLN
100.0000 mL | Freq: Once | INTRAVENOUS | Status: AC | PRN
  Administered 2021-02-24: 75 mL via INTRAVENOUS

## 2021-02-24 NOTE — Discharge Instructions (Addendum)
Stop azithromycin

## 2021-02-24 NOTE — ED Provider Notes (Signed)
[facility]-EMERGENCY DEPT Provider Note   CSN: [id number] Arrival date & time: 02/24/21  1141     History Chief Complaint  Patient presents with   Cough   Shortness of Breath    [name] is a 65 y.o. female.  Pt presents to the ED today with sob and cough.  She has had sx for a week.  She did go to the ED at [facility] on 7/15.  She had a neg CXR and neg Covid.  She has been on steroids and abx and is not improving.  She has been coughing so much that she is having a lot of pain in her chest.  She went to UC today and was sent here for further eval.        Past Medical History:  Diagnosis Date   Acid reflux    Apocrine metaplasia of breast    Asthma    BV (bacterial vaginosis)    Depression    h/o   Endometriosis    h/o   HSV-1 (herpes simplex virus 1) infection    Hyperlipidemia    Hypothyroid    Kidney disease    Obese    Seasonal allergies     Patient Active Problem List   Diagnosis Date Noted   RUQ abdominal pain 05/14/2014   Abdominal pain, epigastric 05/14/2014   Nausea without vomiting 05/14/2014   Hypothyroidism 05/14/2014   GERD (gastroesophageal reflux disease) 05/14/2014   Asthma, chronic 05/14/2014    Past Surgical History:  Procedure Laterality Date   ABDOMINAL HYSTERECTOMY     CESAREAN SECTION     x3   dental work     cap placed 02-02-16   LEG SURGERY     had non cancerous tumors removed from left leg   SINUS ENDO W/FUSION     x3   TUBAL LIGATION     WISDOM TOOTH EXTRACTION       OB History     Gravida  3   Para  3   Term      Preterm      AB      Living  3      SAB      IAB      Ectopic      Multiple      Live Births              Family History  Problem Relation Age of Onset   Cancer Maternal Uncle    Emphysema Maternal Uncle    Stroke Other    Asthma Mother    Mitral valve prolapse Mother    Thyroid disease Mother    Heart disease Brother    Mitral valve prolapse  Brother    Cancer Maternal Aunt        uterine and liver and kidney   Diabetes Maternal Grandmother    Heart disease Maternal Grandmother    Thyroid disease Maternal Grandmother    Diabetes Paternal Grandmother    Diabetes Paternal Grandfather    Colon cancer Paternal Uncle    Stomach cancer Neg Hx    Rectal cancer Neg Hx     Social History   Tobacco Use   Smoking status: Never   Smokeless tobacco: Never  Substance Use Topics   Alcohol use: Yes    Comment: socially   Drug use: No    Home Medications Prior to Admission medications   Medication Sig Start Date End Date Taking? Authorizing  Provider  chlorpheniramine-HYDROcodone (TUSSIONEX PENNKINETIC ER) 10-8 MG/5ML SUER Take 5 mLs by mouth every 12 (twelve) hours as needed for cough. 02/24/21  Yes Jacalyn LefevreHaviland, Willford Rabideau, MD  doxycycline (VIBRAMYCIN) 100 MG capsule Take 1 capsule (100 mg total) by mouth 2 (two) times daily. 02/24/21  Yes Jacalyn LefevreHaviland, Glendola Friedhoff, MD  acetaminophen (TYLENOL) 500 MG tablet Take 2 tablets (1,000 mg total) by mouth every 6 (six) hours as needed for moderate pain or fever. 02/20/21   Arby BarrettePfeiffer, Marcy, MD  ascorbic acid (VITAMIN C) 500 MG tablet Take 500 mg by mouth daily.    [provider]  [facility]/ACT AERO Inhale 100 mcg into the lungs as needed. 01/01/20   [provider]  azelastine (ASTELIN) 0.1 % nasal spray Place 1 spray into both nostrils 2 (two) times daily as needed for allergies. Reported on 01/23/2016    [provider]  benzonatate (TESSALON PERLES) 100 MG capsule Take 2 capsules (200 mg total) by mouth 3 (three) times daily as needed for cough. 02/20/21   Arby BarrettePfeiffer, Marcy, MD  buPROPion (WELLBUTRIN XL) 150 MG 24 hr tablet Take 1 tablet by mouth every morning. 09/22/20   [provider]  Cholecalciferol 50 MCG (2000 UT) CAPS Take 50 mcg by mouth daily. 10/01/16   [provider]  cyclobenzaprine (FLEXERIL) 10 MG tablet Take 1 tablet by mouth daily as needed.     [provider]  HYDROcodone bit-homatropine (HYCODAN) 5-1.5 MG/5ML syrup Take 5 mLs by mouth every 6 (six) hours as needed for cough. 02/20/21   Arby BarrettePfeiffer, Marcy, MD  ipratropium (ATROVENT) 0.06 % nasal spray ipratropium bromide 42 mcg (0.06 %) nasal spray    [provider]  levothyroxine (SYNTHROID) 75 MCG tablet 1 tablet in the morning on an empty stomach    [provider]  meclizine (ANTIVERT) 25 MG tablet Take 25 mg by mouth every 6 (six) hours as needed. 01/01/20   [provider]  Multiple Vitamins-Minerals (MULTIVITAMINS THER. W/MINERALS) TABS Take 1 tablet by mouth daily.    [provider]  nortriptyline (PAMELOR) 50 MG capsule Take 50 mg by mouth daily. 10/29/20   [provider]  Omega-3 Fatty Acids (OMEGA 3 PO) Take 1 tablet by mouth daily.    [provider]  omeprazole (PRILOSEC) 40 MG capsule Take 40 mg by mouth daily. 10/24/20   [provider]  ondansetron (ZOFRAN ODT) 4 MG disintegrating tablet Take 1 tablet (4 mg total) by mouth every 4 (four) hours as needed for nausea or vomiting. 02/20/21   Arby BarrettePfeiffer, Marcy, MD  ondansetron (ZOFRAN) 8 MG tablet Take 8 mg by mouth as needed.    [provider]  polyvinyl alcohol (LIQUIFILM TEARS) 1.4 % ophthalmic solution Place 1 drop into both eyes daily as needed for dry eyes.    [provider]  rizatriptan (MAXALT-MLT) 10 MG disintegrating tablet Take 10 mg by mouth daily as needed. 01/01/20   [provider]  rosuvastatin (CRESTOR) 40 MG tablet Take 40 mg by mouth daily. 03/07/20   [provider]  scopolamine (TRANSDERM-SCOP) 1 MG/3DAYS 1 patch to skin behind the ear as needed 10/24/20   [provider]  valACYclovir (VALTREX) 500 MG tablet Take 500 mg by mouth as needed.    [provider]    Allergies    Erythromycin, Amoxicillin-pot clavulanate, and Darvocet [propoxyphene n-acetaminophen]  Review of Systems    Review of Systems  Respiratory:  Positive for cough and shortness of breath.  All other systems reviewed and are negative.  Physical Exam Updated Vital Signs BP 136/69   Pulse 73   Temp 99 F (37.2 C) (Oral)   Resp 19   SpO2 99%   Physical Exam Vitals and nursing note reviewed.  Constitutional:      Appearance: She is well-developed.  HENT:     Head: Normocephalic and atraumatic.     Mouth/Throat:     Mouth: Mucous membranes are moist.     Pharynx: Oropharynx is clear.  Eyes:     Extraocular Movements: Extraocular movements intact.     Pupils: Pupils are equal, round, and reactive to light.  Cardiovascular:     Rate and Rhythm: Normal rate and regular rhythm.  Pulmonary:     Effort: Pulmonary effort is normal.     Breath sounds: Normal breath sounds.  Chest:     Chest wall: Tenderness present.  Abdominal:     General: Bowel sounds are normal.     Palpations: Abdomen is soft.  Musculoskeletal:        General: Normal range of motion.     Cervical back: Normal range of motion and neck supple.  Skin:    General: Skin is warm.     Capillary Refill: Capillary refill takes less than 2 seconds.  Neurological:     General: No focal deficit present.     Mental Status: She is alert and oriented to person, place, and time.  Psychiatric:        Mood and Affect: Mood normal.        Behavior: Behavior normal.    ED Results / Procedures / Treatments   Labs (all labs ordered are listed, but only abnormal results are displayed) Labs Reviewed  BASIC METABOLIC PANEL - Abnormal; Notable for the following components:      Result Value   CO2 21 (*)    Glucose, Bld 101 (*)    Creatinine, Ser 1.56 (*)    GFR, Estimated 37 (*)    Anion gap 16 (*)    All other components within normal limits  RESP PANEL BY RT-PCR (FLU A&B, COVID) ARPGX2  CULTURE, BLOOD (ROUTINE X 2)  CULTURE, BLOOD (ROUTINE X 2)  BRAIN NATRIURETIC PEPTIDE  CBC WITH DIFFERENTIAL/PLATELET  LACTIC ACID, PLASMA   TROPONIN I (HIGH SENSITIVITY)    EKG EKG Interpretation  Date/Time:  Tuesday February 24 2021 12:35:10 EDT Ventricular Rate:  84 PR Interval:  149 QRS Duration: 94 QT Interval:  376 QTC Calculation: 445 R Axis:   -70 Text Interpretation: Sinus rhythm Left anterior fascicular block Abnormal R-wave progression, early transition 12 Lead; Mason-Likar No significant change since last tracing Confirmed by Jacalyn Lefevre [phone]) on 02/24/2021 1:00:26 PM  Radiology CT Angio Chest PE W and/or Wo Contrast  Result Date: 02/24/2021 CLINICAL DATA:  Cough and shortness of breath EXAM: CT ANGIOGRAPHY CHEST WITH CONTRAST TECHNIQUE: Multidetector CT imaging of the chest was performed using the standard protocol during bolus administration of intravenous contrast. Multiplanar CT image reconstructions and MIPs were obtained to evaluate the vascular anatomy. CONTRAST:  93mL OMNIPAQUE IOHEXOL 350 MG/ML SOLN COMPARISON:  02/20/2021 FINDINGS: Cardiovascular: Thoracic aorta and its branches show atherosclerotic calcification without aneurysmal dilatation. No cardiac enlargement is noted. No coronary calcifications are seen. The pulmonary artery shows a normal branching pattern bilaterally. No filling defects are identified to suggest pulmonary emboli. Mediastinum/Nodes: Thoracic inlet is within normal limits. No sizable hilar or mediastinal adenopathy is noted. Small likely reactive right infrahilar nodes  are seen. The esophagus as visualized is within normal limits. Lungs/Pleura: Lungs are well aerated bilaterally. Minimal dependent atelectatic changes are noted bilaterally. Additionally some mild bronchial thickening and patchy infiltrate is seen in the right lower lobe. Upper Abdomen: Mild fatty infiltration of the liver is noted. The remainder of the upper abdomen is within normal limits. Musculoskeletal: No chest wall abnormality. No acute or significant osseous findings. Review of the MIP images confirms the above  findings. IMPRESSION: No evidence of pulmonary emboli. Patchy infiltrate in the right lower lobe medially with some associated bronchial wall thickening. Correlate with pending respiratory panel. Fatty infiltration of the liver. Aortic Atherosclerosis (ICD10-I70.0). Electronically Signed   By: Alcide Clever M.D.   On: 02/24/2021 13:34    Procedures Procedures   Medications Ordered in ED Medications  sodium chloride 0.9 % bolus 1,000 mL (0 mLs Intravenous Stopped 02/24/21 1540)  morphine 4 MG/ML injection 4 mg (4 mg Intravenous Given 02/24/21 1222)  ondansetron (ZOFRAN) injection 4 mg (4 mg Intravenous Given 02/24/21 1222)  iohexol (OMNIPAQUE) 350 MG/ML injection 100 mL (75 mLs Intravenous Contrast Given 02/24/21 1250)  cefTRIAXone (ROCEPHIN) 1 g in sodium chloride 0.9 % 100 mL IVPB (0 g Intravenous Stopped 02/24/21 1447)  morphine 4 MG/ML injection 4 mg (4 mg Intravenous Given 02/24/21 1536)    ED Course  I have reviewed the triage vital signs and the nursing notes.  Pertinent labs & imaging results that were available during my care of the patient were reviewed by me and considered in my medical decision making (see chart for details).    MDM Rules/Calculators/A&P                         Pt has a pneumonia.  She does not have a PE.  Pt is treated with rocephin IV.  She took azithromycin this am.  She is able to ambulate with O2 sats staying above 92%.  The pt is feeling better.  She is stable for d/c.  Return if worse.   Final Clinical Impression(s) / ED Diagnoses Final diagnoses:  Community acquired pneumonia of right lower lobe of lung    Rx / DC Orders ED Discharge Orders          Ordered    doxycycline (VIBRAMYCIN) 100 MG capsule  2 times daily        02/24/21 1552    chlorpheniramine-HYDROcodone (TUSSIONEX PENNKINETIC ER) 10-8 MG/5ML SUER  Every 12 hours PRN        02/24/21 1552             Jacalyn Lefevre, MD 02/24/21 1607

## 2021-02-24 NOTE — ED Notes (Signed)
The patient ambulated about 60 feet. O2 sats remained between 92-100% .She did not complain of shortness of breath, but did complain of pain.

## 2021-02-24 NOTE — ED Triage Notes (Signed)
Pt presents from UC after having cough and sob x 1 week. Seen at PCP and [facility] ED and came from [facility] today after symptoms have not resolved. States that she has had 3 negative covid tests and was dx with viral bronchitis but has not gotten any better. R/o PE per UC. Alert and oriented.

## 2021-02-27 DIAGNOSIS — J189 Pneumonia, unspecified organism: Secondary | ICD-10-CM | POA: Diagnosis not present

## 2021-03-01 LAB — CULTURE, BLOOD (ROUTINE X 2)
Culture: NO GROWTH
Culture: NO GROWTH
Special Requests: ADEQUATE
Special Requests: ADEQUATE

## 2021-03-05 DIAGNOSIS — J189 Pneumonia, unspecified organism: Secondary | ICD-10-CM | POA: Diagnosis not present

## 2021-04-01 DIAGNOSIS — R61 Generalized hyperhidrosis: Secondary | ICD-10-CM | POA: Diagnosis not present

## 2021-04-01 DIAGNOSIS — E039 Hypothyroidism, unspecified: Secondary | ICD-10-CM | POA: Diagnosis not present

## 2021-04-01 DIAGNOSIS — N1832 Chronic kidney disease, stage 3b: Secondary | ICD-10-CM | POA: Diagnosis not present

## 2021-04-01 DIAGNOSIS — K219 Gastro-esophageal reflux disease without esophagitis: Secondary | ICD-10-CM | POA: Diagnosis not present

## 2021-04-01 DIAGNOSIS — G43809 Other migraine, not intractable, without status migrainosus: Secondary | ICD-10-CM | POA: Diagnosis not present

## 2021-04-01 DIAGNOSIS — E669 Obesity, unspecified: Secondary | ICD-10-CM | POA: Diagnosis not present

## 2021-04-01 DIAGNOSIS — F419 Anxiety disorder, unspecified: Secondary | ICD-10-CM | POA: Diagnosis not present

## 2021-04-01 DIAGNOSIS — G4700 Insomnia, unspecified: Secondary | ICD-10-CM | POA: Diagnosis not present

## 2021-04-01 DIAGNOSIS — E785 Hyperlipidemia, unspecified: Secondary | ICD-10-CM | POA: Diagnosis not present

## 2021-04-01 DIAGNOSIS — F324 Major depressive disorder, single episode, in partial remission: Secondary | ICD-10-CM | POA: Diagnosis not present

## 2021-04-10 ENCOUNTER — Emergency Department (HOSPITAL_BASED_OUTPATIENT_CLINIC_OR_DEPARTMENT_OTHER): Payer: PPO

## 2021-04-10 ENCOUNTER — Other Ambulatory Visit: Payer: Self-pay

## 2021-04-10 ENCOUNTER — Encounter (HOSPITAL_BASED_OUTPATIENT_CLINIC_OR_DEPARTMENT_OTHER): Payer: Self-pay | Admitting: Emergency Medicine

## 2021-04-10 ENCOUNTER — Inpatient Hospital Stay (HOSPITAL_BASED_OUTPATIENT_CLINIC_OR_DEPARTMENT_OTHER)
Admission: EM | Admit: 2021-04-10 | Discharge: 2021-04-15 | DRG: 419 | Disposition: A | Discharge status: Home / Self Care | Payer: PPO | Source: Ambulatory Visit | Attending: Surgery | Admitting: Surgery

## 2021-04-10 DIAGNOSIS — K811 Chronic cholecystitis: Secondary | ICD-10-CM | POA: Diagnosis not present

## 2021-04-10 DIAGNOSIS — K810 Acute cholecystitis: Principal | ICD-10-CM | POA: Diagnosis present

## 2021-04-10 DIAGNOSIS — K819 Cholecystitis, unspecified: Secondary | ICD-10-CM

## 2021-04-10 DIAGNOSIS — Z79899 Other long term (current) drug therapy: Secondary | ICD-10-CM

## 2021-04-10 DIAGNOSIS — R109 Unspecified abdominal pain: Secondary | ICD-10-CM | POA: Diagnosis not present

## 2021-04-10 DIAGNOSIS — Z880 Allergy status to penicillin: Secondary | ICD-10-CM | POA: Diagnosis not present

## 2021-04-10 DIAGNOSIS — Z79890 Hormone replacement therapy (postmenopausal): Secondary | ICD-10-CM | POA: Diagnosis not present

## 2021-04-10 DIAGNOSIS — M79661 Pain in right lower leg: Secondary | ICD-10-CM | POA: Diagnosis not present

## 2021-04-10 DIAGNOSIS — Z8701 Personal history of pneumonia (recurrent): Secondary | ICD-10-CM

## 2021-04-10 DIAGNOSIS — Z800 Family history of malignant neoplasm of digestive organs: Secondary | ICD-10-CM | POA: Diagnosis not present

## 2021-04-10 DIAGNOSIS — Z20822 Contact with and (suspected) exposure to covid-19: Secondary | ICD-10-CM | POA: Diagnosis not present

## 2021-04-10 DIAGNOSIS — G8918 Other acute postprocedural pain: Secondary | ICD-10-CM

## 2021-04-10 DIAGNOSIS — Z8249 Family history of ischemic heart disease and other diseases of the circulatory system: Secondary | ICD-10-CM

## 2021-04-10 DIAGNOSIS — R079 Chest pain, unspecified: Secondary | ICD-10-CM | POA: Diagnosis not present

## 2021-04-10 DIAGNOSIS — Z888 Allergy status to other drugs, medicaments and biological substances status: Secondary | ICD-10-CM | POA: Diagnosis not present

## 2021-04-10 DIAGNOSIS — Z9049 Acquired absence of other specified parts of digestive tract: Secondary | ICD-10-CM

## 2021-04-10 DIAGNOSIS — E039 Hypothyroidism, unspecified: Secondary | ICD-10-CM | POA: Diagnosis not present

## 2021-04-10 DIAGNOSIS — Z833 Family history of diabetes mellitus: Secondary | ICD-10-CM | POA: Diagnosis not present

## 2021-04-10 DIAGNOSIS — Z825 Family history of asthma and other chronic lower respiratory diseases: Secondary | ICD-10-CM | POA: Diagnosis not present

## 2021-04-10 DIAGNOSIS — Z8349 Family history of other endocrine, nutritional and metabolic diseases: Secondary | ICD-10-CM

## 2021-04-10 DIAGNOSIS — R1011 Right upper quadrant pain: Secondary | ICD-10-CM

## 2021-04-10 DIAGNOSIS — Z881 Allergy status to other antibiotic agents status: Secondary | ICD-10-CM | POA: Diagnosis not present

## 2021-04-10 DIAGNOSIS — M79662 Pain in left lower leg: Secondary | ICD-10-CM | POA: Diagnosis not present

## 2021-04-10 DIAGNOSIS — R600 Localized edema: Secondary | ICD-10-CM | POA: Diagnosis present

## 2021-04-10 DIAGNOSIS — R110 Nausea: Secondary | ICD-10-CM | POA: Diagnosis not present

## 2021-04-10 DIAGNOSIS — K219 Gastro-esophageal reflux disease without esophagitis: Secondary | ICD-10-CM | POA: Diagnosis present

## 2021-04-10 DIAGNOSIS — E785 Hyperlipidemia, unspecified: Secondary | ICD-10-CM | POA: Diagnosis not present

## 2021-04-10 DIAGNOSIS — J9811 Atelectasis: Secondary | ICD-10-CM | POA: Diagnosis not present

## 2021-04-10 DIAGNOSIS — K82A1 Gangrene of gallbladder in cholecystitis: Secondary | ICD-10-CM | POA: Diagnosis not present

## 2021-04-10 DIAGNOSIS — J189 Pneumonia, unspecified organism: Secondary | ICD-10-CM | POA: Diagnosis not present

## 2021-04-10 LAB — URINALYSIS, ROUTINE W REFLEX MICROSCOPIC
Bilirubin Urine: NEGATIVE
Glucose, UA: NEGATIVE mg/dL
Hgb urine dipstick: NEGATIVE
Ketones, ur: NEGATIVE mg/dL
Nitrite: NEGATIVE
Protein, ur: NEGATIVE mg/dL
Specific Gravity, Urine: 1.021 (ref 1.005–1.030)
pH: 6.5 (ref 5.0–8.0)

## 2021-04-10 LAB — COMPREHENSIVE METABOLIC PANEL
ALT: 17 U/L (ref 0–44)
AST: 19 U/L (ref 15–41)
Albumin: 3.7 g/dL (ref 3.5–5.0)
Alkaline Phosphatase: 52 U/L (ref 38–126)
Anion gap: 6 (ref 5–15)
BUN: 16 mg/dL (ref 8–23)
CO2: 28 mmol/L (ref 22–32)
Calcium: 9 mg/dL (ref 8.9–10.3)
Chloride: 108 mmol/L (ref 98–111)
Creatinine, Ser: 1.11 mg/dL — ABNORMAL HIGH (ref 0.44–1.00)
GFR, Estimated: 55 mL/min — ABNORMAL LOW (ref >60)
Glucose, Bld: 91 mg/dL (ref 70–99)
Potassium: 4.3 mmol/L (ref 3.5–5.1)
Sodium: 142 mmol/L (ref 135–145)
Total Bilirubin: 0.4 mg/dL (ref 0.3–1.2)
Total Protein: 6.5 g/dL (ref 6.5–8.1)

## 2021-04-10 LAB — LIPASE, BLOOD: Lipase: 22 U/L (ref 11–51)

## 2021-04-10 LAB — CBC
HCT: 36.1 % (ref 36.0–46.0)
Hemoglobin: 11.5 g/dL — ABNORMAL LOW (ref 12.0–15.0)
MCH: 27.8 pg (ref 26.0–34.0)
MCHC: 31.9 g/dL (ref 30.0–36.0)
MCV: 87.2 fL (ref 80.0–100.0)
Platelets: 204 10*3/uL (ref 150–400)
RBC: 4.14 MIL/uL (ref 3.87–5.11)
RDW: 13.2 % (ref 11.5–15.5)
WBC: 6.1 10*3/uL (ref 4.0–10.5)
nRBC: 0 % (ref 0.0–0.2)

## 2021-04-10 LAB — RESP PANEL BY RT-PCR (FLU A&B, COVID) ARPGX2
Influenza A by PCR: NEGATIVE
Influenza B by PCR: NEGATIVE
SARS Coronavirus 2 by RT PCR: NEGATIVE

## 2021-04-10 MED ORDER — SODIUM CHLORIDE 0.9 % IV SOLN
INTRAVENOUS | Status: DC | PRN
  Administered 2021-04-10: 500 mL via INTRAVENOUS

## 2021-04-10 MED ORDER — FENTANYL CITRATE PF 50 MCG/ML IJ SOSY
50.0000 ug | PREFILLED_SYRINGE | Freq: Once | INTRAMUSCULAR | Status: AC
  Administered 2021-04-10: 50 ug via INTRAVENOUS
  Filled 2021-04-10: qty 1

## 2021-04-10 MED ORDER — SODIUM CHLORIDE 0.9 % IV SOLN
2.0000 g | Freq: Once | INTRAVENOUS | Status: AC
  Administered 2021-04-10: 2 g via INTRAVENOUS
  Filled 2021-04-10: qty 20

## 2021-04-10 MED ORDER — ONDANSETRON 4 MG PO TBDP
4.0000 mg | ORAL_TABLET | Freq: Once | ORAL | Status: AC | PRN
  Administered 2021-04-10: 4 mg via ORAL
  Filled 2021-04-10: qty 1

## 2021-04-10 MED ORDER — ONDANSETRON HCL 4 MG/2ML IJ SOLN
INTRAMUSCULAR | Status: AC
  Filled 2021-04-10: qty 2

## 2021-04-10 MED ORDER — FENTANYL CITRATE PF 50 MCG/ML IJ SOSY
50.0000 ug | PREFILLED_SYRINGE | Freq: Once | INTRAMUSCULAR | Status: AC
Start: 2021-04-11 — End: 2021-04-11
  Administered 2021-04-11: 50 ug via INTRAVENOUS
  Filled 2021-04-10: qty 1

## 2021-04-10 NOTE — ED Triage Notes (Signed)
Pt presents to ED POV. Pt c/o RUQ abd pain and nausea x3d. Pt reports that she was sent here by PCP to r/o gallstones.

## 2021-04-10 NOTE — ED Provider Notes (Addendum)
Signout note  65 year old lady presented to ER with concern for right upper quadrant pain.  Basic labs stable.  Ultrasound pending.  3:30 PM received signout from [place], plan to follow-up on ultrasound  4:12 PM reassessed patient, updated on ultrasound results, concern for acute cholecystitis, placed consult to general surgery, ordered Rocephin, COVID  4:42 PM discussed case with Dr. Corliss Skains -he will accept to [facility] under the [facility] surgery service.  Patient can have clears, request n.p.o. at midnight tonight.    Milagros Loll, MD 04/10/21 1612    Milagros Loll, MD 04/10/21 [phone]

## 2021-04-10 NOTE — ED Notes (Signed)
Pt given diet Ginger Ale as requested after Dr Stevie Kern updated pt and s/o.

## 2021-04-10 NOTE — ED Notes (Signed)
[facility] has arrived for patient 

## 2021-04-10 NOTE — ED Notes (Signed)
Pt ambulated to the restroom. Introduced self to patient.

## 2021-04-10 NOTE — ED Provider Notes (Signed)
[facility] EMERGENCY DEPT Provider Note   CSN: [id number] Arrival date & time: 04/10/21  1326     History Chief Complaint  Patient presents with   Abdominal Pain    [name] is a 65 y.o. female.  HPI    65 year old female comes in with chief complaint of right upper quadrant abdominal pain into 3 days.  Patient has history of hyperlipidemia/.  She reports that patient has had intermittent abdominal pain over the last several weeks, but typically it is mild and self resolving.  Over the last 3 days she has had more severe abdominal pain at nighttime, last night the pain was excruciating and radiating to her back, prompting her to come to the ER.  Review of system is negative for any fevers -but her temperature is higher than usual.  She was also having some chills, nausea.  Pain resolved this morning but she saw her PCP who advised that she come to the ER.  Past Medical History:  Diagnosis Date   Acid reflux    Apocrine metaplasia of breast    Asthma    BV (bacterial vaginosis)    Depression    h/o   Endometriosis    h/o   HSV-1 (herpes simplex virus 1) infection    Hyperlipidemia    Hypothyroid    Kidney disease    Obese    Seasonal allergies     Patient Active Problem List   Diagnosis Date Noted   RUQ abdominal pain 05/14/2014   Abdominal pain, epigastric 05/14/2014   Nausea without vomiting 05/14/2014   Hypothyroidism 05/14/2014   GERD (gastroesophageal reflux disease) 05/14/2014   Asthma, chronic 05/14/2014    Past Surgical History:  Procedure Laterality Date   ABDOMINAL HYSTERECTOMY     CESAREAN SECTION     x3   dental work     cap placed 02-02-16   LEG SURGERY     had non cancerous tumors removed from left leg   SINUS ENDO W/FUSION     x3   TUBAL LIGATION     WISDOM TOOTH EXTRACTION       OB History     Gravida  3   Para  3   Term      Preterm      AB      Living  3      SAB      IAB      Ectopic       Multiple      Live Births              Family History  Problem Relation Age of Onset   Cancer Maternal Uncle    Emphysema Maternal Uncle    Stroke Other    Asthma Mother    Mitral valve prolapse Mother    Thyroid disease Mother    Heart disease Brother    Mitral valve prolapse Brother    Cancer Maternal Aunt        uterine and liver and kidney   Diabetes Maternal Grandmother    Heart disease Maternal Grandmother    Thyroid disease Maternal Grandmother    Diabetes Paternal Grandmother    Diabetes Paternal Grandfather    Colon cancer Paternal Uncle    Stomach cancer Neg Hx    Rectal cancer Neg Hx     Social History   Tobacco Use   Smoking status: Never   Smokeless tobacco: Never  Substance Use Topics  Alcohol use: Yes    Comment: socially   Drug use: No    Home Medications Prior to Admission medications   Medication Sig Start Date End Date Taking? Authorizing Provider  acetaminophen (TYLENOL) 500 MG tablet Take 2 tablets (1,000 mg total) by mouth every 6 (six) hours as needed for moderate pain or fever. 02/20/21   Arby BarrettePfeiffer, Marcy, MD  ascorbic acid (VITAMIN C) 500 MG tablet Take 500 mg by mouth daily.    [provider]  [facility] HFA 100 MCG/ACT AERO Inhale 100 mcg into the lungs as needed. 01/01/20   [provider]  azelastine (ASTELIN) 0.1 % nasal spray Place 1 spray into both nostrils 2 (two) times daily as needed for allergies. Reported on 01/23/2016    [provider]  benzonatate (TESSALON PERLES) 100 MG capsule Take 2 capsules (200 mg total) by mouth 3 (three) times daily as needed for cough. 02/20/21   Arby BarrettePfeiffer, Marcy, MD  buPROPion (WELLBUTRIN XL) 150 MG 24 hr tablet Take 1 tablet by mouth every morning. 09/22/20   [provider]  chlorpheniramine-HYDROcodone (TUSSIONEX PENNKINETIC ER) 10-8 MG/5ML SUER Take 5 mLs by mouth every 12 (twelve) hours as needed for cough. 02/24/21   Jacalyn LefevreHaviland, Julie, MD  Cholecalciferol 50 MCG (2000  UT) CAPS Take 50 mcg by mouth daily. 10/01/16   [provider]  cyclobenzaprine (FLEXERIL) 10 MG tablet Take 1 tablet by mouth daily as needed.    [provider]  doxycycline (VIBRAMYCIN) 100 MG capsule Take 1 capsule (100 mg total) by mouth 2 (two) times daily. 02/24/21   Jacalyn LefevreHaviland, Julie, MD  HYDROcodone bit-homatropine (HYCODAN) 5-1.5 MG/5ML syrup Take 5 mLs by mouth every 6 (six) hours as needed for cough. 02/20/21   Arby BarrettePfeiffer, Marcy, MD  ipratropium (ATROVENT) 0.06 % nasal spray ipratropium bromide 42 mcg (0.06 %) nasal spray    [provider]  levothyroxine (SYNTHROID) 75 MCG tablet 1 tablet in the morning on an empty stomach    [provider]  meclizine (ANTIVERT) 25 MG tablet Take 25 mg by mouth every 6 (six) hours as needed. 01/01/20   [provider]  Multiple Vitamins-Minerals (MULTIVITAMINS THER. W/MINERALS) TABS Take 1 tablet by mouth daily.    [provider]  nortriptyline (PAMELOR) 50 MG capsule Take 50 mg by mouth daily. 10/29/20   [provider]  Omega-3 Fatty Acids (OMEGA 3 PO) Take 1 tablet by mouth daily.    [provider]  omeprazole (PRILOSEC) 40 MG capsule Take 40 mg by mouth daily. 10/24/20   [provider]  ondansetron (ZOFRAN ODT) 4 MG disintegrating tablet Take 1 tablet (4 mg total) by mouth every 4 (four) hours as needed for nausea or vomiting. 02/20/21   Arby BarrettePfeiffer, Marcy, MD  ondansetron (ZOFRAN) 8 MG tablet Take 8 mg by mouth as needed.    [provider]  polyvinyl alcohol (LIQUIFILM TEARS) 1.4 % ophthalmic solution Place 1 drop into both eyes daily as needed for dry eyes.    [provider]  rizatriptan (MAXALT-MLT) 10 MG disintegrating tablet Take 10 mg by mouth daily as needed. 01/01/20   [provider]  rosuvastatin (CRESTOR) 40 MG tablet Take 40 mg by mouth daily. 03/07/20   [provider]  scopolamine (TRANSDERM-SCOP) 1 MG/3DAYS 1 patch to skin behind  the ear as needed 10/24/20   [provider]  valACYclovir (VALTREX) 500 MG tablet Take 500 mg by mouth as needed.    [provider]  Allergies    Erythromycin, Amoxicillin-pot clavulanate, and Darvocet [propoxyphene n-acetaminophen]  Review of Systems   Review of Systems  Constitutional:  Positive for activity change.  Respiratory:  Negative for shortness of breath.   Cardiovascular:  Negative for chest pain.  Gastrointestinal:  Positive for abdominal pain.  Allergic/Immunologic: Negative for immunocompromised state.  Hematological:  Does not bruise/bleed easily.  All other systems reviewed and are negative.  Physical Exam Updated Vital Signs BP 135/73 (BP Location: Left Arm)   Pulse 65   Temp 98.1 F (36.7 C)   Resp 16   Ht 5\' 6"  (1.676 m)   Wt 94.2 kg   SpO2 100%   BMI 33.51 kg/m   Physical Exam Vitals and nursing note reviewed.  Constitutional:      Appearance: She is well-developed.  HENT:     Head: Atraumatic.  Cardiovascular:     Rate and Rhythm: Normal rate.  Pulmonary:     Effort: Pulmonary effort is normal.  Abdominal:     Tenderness: There is abdominal tenderness in the right upper quadrant. There is guarding. Positive signs include Murphy's sign.  Musculoskeletal:     Cervical back: Normal range of motion and neck supple.  Skin:    General: Skin is warm and dry.  Neurological:     Mental Status: She is alert and oriented to person, place, and time.    ED Results / Procedures / Treatments   Labs (all labs ordered are listed, but only abnormal results are displayed) Labs Reviewed  COMPREHENSIVE METABOLIC PANEL - Abnormal; Notable for the following components:      Result Value   Creatinine, Ser 1.11 (*)    GFR, Estimated 55 (*)    All other components within normal limits  CBC - Abnormal; Notable for the following components:   Hemoglobin 11.5 (*)    All other components within normal limits  URINALYSIS, ROUTINE W REFLEX  MICROSCOPIC - Abnormal; Notable for the following components:   Leukocytes,Ua TRACE (*)    All other components within normal limits  LIPASE, BLOOD    EKG None  Radiology Abdomen Limited RUQ (LIVER/GB)  Result Date: 04/10/2021 CLINICAL DATA:  Right upper quadrant pain for 3 days EXAM: ULTRASOUND ABDOMEN LIMITED RIGHT UPPER QUADRANT COMPARISON:  CT abdomen/pelvis 07/25/2015, ultrasound 05/2014 FINDINGS: Gallbladder: Mural adherent gallstones versus polyps identified measuring up to 8 mm. The gallbladder wall is thickened measuring up to 6 mm. A positive sonographic Murphy's sign is reported. The fat around the gallbladder appears echogenic and inflamed. Common bile duct: Diameter: 5 mm Liver: A small hypoechoic lesion is seen in the left hepatic lobe likely corresponding to a small hypodense lesion seen on the prior CT from 2016, most likely reflecting a cyst. There are no other focal lesions. Portal vein is patent on color Doppler imaging with normal direction of blood flow towards the liver. Other: None. IMPRESSION: 1. Gallbladder wall thickening and positive sonographic Murphy's sign concerning for acute cholecystitis. No shadowing stones are identified. 2. Mural adherent gallstones versus polyps measuring up to 8 mm. If cholecystectomy is not performed, follow-up ultrasound is recommended in 1 year. Electronically Signed   By: 2017 M.D.   On: 04/10/2021 15:53    Procedures Procedures   Medications Ordered in ED Medications  ondansetron (ZOFRAN-ODT) disintegrating tablet 4 mg (4 mg Oral Given 04/10/21 1430)  fentaNYL (SUBLIMAZE) injection 50 mcg (50 mcg Intravenous Given 04/10/21 1553)    ED Course  I have reviewed the  triage vital signs and the nursing notes.  Pertinent labs & imaging results that were available during my care of the patient were reviewed by me and considered in my medical decision making (see chart for details).    MDM Rules/Calculators/A&P                            65 year old female comes in with chief complaint of abdominal pain.  DDx includes: Pancreatitis Hepatobiliary pathology including cholecystitis Gastritis/PUD SBO ACS  Based on the exam, it appears that she likely has cholelithiasis versus cholecystitis. Labs are reassuring.  Ultrasound ordered.  Patient's care has been signed out to incoming team.  Clinically not septic.    Final Clinical Impression(s) / ED Diagnoses Final diagnoses:  RUQ abdominal pain    Rx / DC Orders ED Discharge Orders     None        Derwood Kaplan, MD 04/10/21 [phone]

## 2021-04-11 ENCOUNTER — Encounter (HOSPITAL_COMMUNITY): Payer: Self-pay

## 2021-04-11 ENCOUNTER — Observation Stay (HOSPITAL_COMMUNITY): Payer: PPO | Admitting: Certified Registered Nurse Anesthetist

## 2021-04-11 ENCOUNTER — Encounter (HOSPITAL_COMMUNITY): Admission: EM | Disposition: A | Discharge status: Home / Self Care | Payer: Self-pay | Source: Ambulatory Visit

## 2021-04-11 DIAGNOSIS — Z20822 Contact with and (suspected) exposure to covid-19: Secondary | ICD-10-CM | POA: Diagnosis not present

## 2021-04-11 DIAGNOSIS — Z825 Family history of asthma and other chronic lower respiratory diseases: Secondary | ICD-10-CM | POA: Diagnosis not present

## 2021-04-11 DIAGNOSIS — K219 Gastro-esophageal reflux disease without esophagitis: Secondary | ICD-10-CM | POA: Diagnosis not present

## 2021-04-11 DIAGNOSIS — E039 Hypothyroidism, unspecified: Secondary | ICD-10-CM | POA: Diagnosis not present

## 2021-04-11 DIAGNOSIS — Z8249 Family history of ischemic heart disease and other diseases of the circulatory system: Secondary | ICD-10-CM | POA: Diagnosis not present

## 2021-04-11 DIAGNOSIS — K810 Acute cholecystitis: Secondary | ICD-10-CM | POA: Diagnosis present

## 2021-04-11 DIAGNOSIS — Z800 Family history of malignant neoplasm of digestive organs: Secondary | ICD-10-CM | POA: Diagnosis not present

## 2021-04-11 DIAGNOSIS — Z8349 Family history of other endocrine, nutritional and metabolic diseases: Secondary | ICD-10-CM | POA: Diagnosis not present

## 2021-04-11 DIAGNOSIS — Z833 Family history of diabetes mellitus: Secondary | ICD-10-CM | POA: Diagnosis not present

## 2021-04-11 DIAGNOSIS — R600 Localized edema: Secondary | ICD-10-CM | POA: Diagnosis not present

## 2021-04-11 DIAGNOSIS — Z79899 Other long term (current) drug therapy: Secondary | ICD-10-CM | POA: Diagnosis not present

## 2021-04-11 DIAGNOSIS — Z888 Allergy status to other drugs, medicaments and biological substances status: Secondary | ICD-10-CM | POA: Diagnosis not present

## 2021-04-11 DIAGNOSIS — Z880 Allergy status to penicillin: Secondary | ICD-10-CM | POA: Diagnosis not present

## 2021-04-11 DIAGNOSIS — E785 Hyperlipidemia, unspecified: Secondary | ICD-10-CM | POA: Diagnosis not present

## 2021-04-11 DIAGNOSIS — Z79890 Hormone replacement therapy (postmenopausal): Secondary | ICD-10-CM | POA: Diagnosis not present

## 2021-04-11 DIAGNOSIS — Z881 Allergy status to other antibiotic agents status: Secondary | ICD-10-CM | POA: Diagnosis not present

## 2021-04-11 HISTORY — PX: CHOLECYSTECTOMY: SHX55

## 2021-04-11 LAB — SURGICAL PCR SCREEN
MRSA, PCR: NEGATIVE
Staphylococcus aureus: NEGATIVE

## 2021-04-11 SURGERY — LAPAROSCOPIC CHOLECYSTECTOMY
Anesthesia: General | Site: Abdomen

## 2021-04-11 MED ORDER — BUPROPION HCL ER (XL) 150 MG PO TB24
150.0000 mg | ORAL_TABLET | Freq: Every morning | ORAL | Status: DC
  Filled 2021-04-11: qty 1

## 2021-04-11 MED ORDER — HYDROMORPHONE HCL 1 MG/ML IJ SOLN
0.5000 mg | INTRAMUSCULAR | Status: DC | PRN
  Administered 2021-04-11 – 2021-04-12 (×4): 0.5 mg via INTRAVENOUS
  Filled 2021-04-11 (×5): qty 0.5

## 2021-04-11 MED ORDER — LACTATED RINGERS IV SOLN: INTRAVENOUS | Status: DC

## 2021-04-11 MED ORDER — HEMOSTATIC AGENTS (NO CHARGE) OPTIME
TOPICAL | Status: DC | PRN
  Administered 2021-04-11: 1 via TOPICAL

## 2021-04-11 MED ORDER — ROCURONIUM BROMIDE 10 MG/ML (PF) SYRINGE
PREFILLED_SYRINGE | INTRAVENOUS | Status: AC
  Filled 2021-04-11: qty 10

## 2021-04-11 MED ORDER — ONDANSETRON HCL 4 MG/2ML IJ SOLN
INTRAMUSCULAR | Status: DC | PRN
  Administered 2021-04-11: 4 mg via INTRAVENOUS

## 2021-04-11 MED ORDER — ACETAMINOPHEN 325 MG PO TABS
650.0000 mg | ORAL_TABLET | Freq: Four times a day (QID) | ORAL | Status: DC | PRN
  Administered 2021-04-11: 650 mg via ORAL
  Filled 2021-04-11: qty 2

## 2021-04-11 MED ORDER — CHLORHEXIDINE GLUCONATE CLOTH 2 % EX PADS: 6.0000 | MEDICATED_PAD | Freq: Once | CUTANEOUS | Status: DC

## 2021-04-11 MED ORDER — ROCURONIUM BROMIDE 10 MG/ML (PF) SYRINGE
PREFILLED_SYRINGE | INTRAVENOUS | Status: DC | PRN
  Administered 2021-04-11: 70 mg via INTRAVENOUS

## 2021-04-11 MED ORDER — PHENYLEPHRINE 40 MCG/ML (10ML) SYRINGE FOR IV PUSH (FOR BLOOD PRESSURE SUPPORT)
PREFILLED_SYRINGE | INTRAVENOUS | Status: DC | PRN
  Administered 2021-04-11: 40 ug via INTRAVENOUS

## 2021-04-11 MED ORDER — DEXAMETHASONE SODIUM PHOSPHATE 10 MG/ML IJ SOLN
INTRAMUSCULAR | Status: DC | PRN
Start: 2021-04-11 — End: 2021-04-11
  Administered 2021-04-11: 10 mg via INTRAVENOUS

## 2021-04-11 MED ORDER — CHLORHEXIDINE GLUCONATE CLOTH 2 % EX PADS
6.0000 | MEDICATED_PAD | Freq: Once | CUTANEOUS | Status: AC
  Administered 2021-04-11: 6 via TOPICAL

## 2021-04-11 MED ORDER — ONDANSETRON HCL 4 MG/2ML IJ SOLN
4.0000 mg | Freq: Four times a day (QID) | INTRAMUSCULAR | Status: DC | PRN
  Filled 2021-04-11: qty 2

## 2021-04-11 MED ORDER — ONDANSETRON 4 MG PO TBDP: 4.0000 mg | ORAL_TABLET | Freq: Four times a day (QID) | ORAL | Status: DC | PRN

## 2021-04-11 MED ORDER — ONDANSETRON HCL 4 MG/2ML IJ SOLN
INTRAMUSCULAR | Status: AC
  Filled 2021-04-11: qty 2

## 2021-04-11 MED ORDER — PANTOPRAZOLE SODIUM 40 MG PO TBEC
40.0000 mg | DELAYED_RELEASE_TABLET | Freq: Every day | ORAL | Status: DC
  Administered 2021-04-11 – 2021-04-15 (×5): 40 mg via ORAL
  Filled 2021-04-11 (×6): qty 1

## 2021-04-11 MED ORDER — PROPOFOL 10 MG/ML IV BOLUS
INTRAVENOUS | Status: DC | PRN
  Administered 2021-04-11: 150 mg via INTRAVENOUS

## 2021-04-11 MED ORDER — LEVOTHYROXINE SODIUM 75 MCG PO TABS
75.0000 ug | ORAL_TABLET | Freq: Every day | ORAL | Status: DC
  Administered 2021-04-11 – 2021-04-15 (×5): 75 ug via ORAL
  Filled 2021-04-11 (×5): qty 1

## 2021-04-11 MED ORDER — LIDOCAINE 2% (20 MG/ML) 5 ML SYRINGE
INTRAMUSCULAR | Status: AC
  Filled 2021-04-11: qty 5

## 2021-04-11 MED ORDER — PROPOFOL 10 MG/ML IV BOLUS
INTRAVENOUS | Status: AC
  Filled 2021-04-11: qty 20

## 2021-04-11 MED ORDER — CHLORHEXIDINE GLUCONATE 0.12 % MT SOLN
OROMUCOSAL | Status: AC
  Administered 2021-04-11: 15 mL
  Filled 2021-04-11: qty 15

## 2021-04-11 MED ORDER — MIDAZOLAM HCL 2 MG/2ML IJ SOLN
INTRAMUSCULAR | Status: AC
  Filled 2021-04-11: qty 2

## 2021-04-11 MED ORDER — NORTRIPTYLINE HCL 25 MG PO CAPS
50.0000 mg | ORAL_CAPSULE | Freq: Every day | ORAL | Status: DC
  Filled 2021-04-11 (×4): qty 2

## 2021-04-11 MED ORDER — INDOCYANINE GREEN 25 MG IV SOLR
INTRAVENOUS | Status: DC | PRN
  Administered 2021-04-11: 5 mg via INTRAVENOUS

## 2021-04-11 MED ORDER — BUPIVACAINE-EPINEPHRINE 0.25% -1:200000 IJ SOLN
INTRAMUSCULAR | Status: DC | PRN
  Administered 2021-04-11: 10 mL

## 2021-04-11 MED ORDER — SUGAMMADEX SODIUM 200 MG/2ML IV SOLN
INTRAVENOUS | Status: DC | PRN
  Administered 2021-04-11: 200 mg via INTRAVENOUS

## 2021-04-11 MED ORDER — OXYCODONE HCL 5 MG/5ML PO SOLN
5.0000 mg | Freq: Once | ORAL | Status: DC | PRN
Start: 2021-04-11 — End: 2021-04-11

## 2021-04-11 MED ORDER — FENTANYL CITRATE (PF) 250 MCG/5ML IJ SOLN
INTRAMUSCULAR | Status: AC
  Filled 2021-04-11: qty 5

## 2021-04-11 MED ORDER — ENOXAPARIN SODIUM 40 MG/0.4ML IJ SOSY
40.0000 mg | PREFILLED_SYRINGE | INTRAMUSCULAR | Status: DC
  Administered 2021-04-11 – 2021-04-14 (×4): 40 mg via SUBCUTANEOUS
  Filled 2021-04-11 (×5): qty 0.4

## 2021-04-11 MED ORDER — SODIUM CHLORIDE 0.9 % IR SOLN
Status: DC | PRN
  Administered 2021-04-11: 1

## 2021-04-11 MED ORDER — LIDOCAINE 2% (20 MG/ML) 5 ML SYRINGE
INTRAMUSCULAR | Status: DC | PRN
  Administered 2021-04-11: 60 mg via INTRAVENOUS

## 2021-04-11 MED ORDER — ACETAMINOPHEN 500 MG PO TABS
1000.0000 mg | ORAL_TABLET | Freq: Four times a day (QID) | ORAL | Status: DC
  Administered 2021-04-11 – 2021-04-15 (×13): 1000 mg via ORAL
  Filled 2021-04-11 (×15): qty 2

## 2021-04-11 MED ORDER — ROSUVASTATIN CALCIUM 20 MG PO TABS
40.0000 mg | ORAL_TABLET | Freq: Every day | ORAL | Status: DC
  Administered 2021-04-11 – 2021-04-15 (×5): 40 mg via ORAL
  Filled 2021-04-11 (×7): qty 2

## 2021-04-11 MED ORDER — CEFTRIAXONE SODIUM 2 G IJ SOLR: 2.0000 g | INTRAMUSCULAR | Status: DC

## 2021-04-11 MED ORDER — FENTANYL CITRATE (PF) 100 MCG/2ML IJ SOLN
25.0000 ug | INTRAMUSCULAR | Status: DC | PRN
  Administered 2021-04-11 (×2): 50 ug via INTRAVENOUS

## 2021-04-11 MED ORDER — OXYCODONE HCL 5 MG PO TABS: 5.0000 mg | ORAL_TABLET | Freq: Once | ORAL | Status: DC | PRN

## 2021-04-11 MED ORDER — AMISULPRIDE (ANTIEMETIC) 5 MG/2ML IV SOLN
5.0000 mg | Freq: Once | INTRAVENOUS | Status: AC
  Administered 2021-04-11: 5 mg via INTRAVENOUS

## 2021-04-11 MED ORDER — AMISULPRIDE (ANTIEMETIC) 5 MG/2ML IV SOLN
INTRAVENOUS | Status: AC
  Filled 2021-04-11: qty 2

## 2021-04-11 MED ORDER — FENTANYL CITRATE (PF) 100 MCG/2ML IJ SOLN
INTRAMUSCULAR | Status: AC
  Filled 2021-04-11: qty 2

## 2021-04-11 MED ORDER — DEXAMETHASONE SODIUM PHOSPHATE 10 MG/ML IJ SOLN
INTRAMUSCULAR | Status: AC
  Filled 2021-04-11: qty 1

## 2021-04-11 MED ORDER — POLYVINYL ALCOHOL 1.4 % OP SOLN: 1.0000 [drp] | Freq: Every day | OPHTHALMIC | Status: DC | PRN

## 2021-04-11 MED ORDER — CEFAZOLIN SODIUM-DEXTROSE 2-3 GM-%(50ML) IV SOLR: INTRAVENOUS | Status: DC | PRN

## 2021-04-11 MED ORDER — SIMETHICONE 80 MG PO CHEW
80.0000 mg | CHEWABLE_TABLET | Freq: Four times a day (QID) | ORAL | Status: DC | PRN
  Administered 2021-04-12: 80 mg via ORAL
  Filled 2021-04-11: qty 1

## 2021-04-11 MED ORDER — CEFAZOLIN SODIUM-DEXTROSE 2-4 GM/100ML-% IV SOLN
2.0000 g | INTRAVENOUS | Status: AC
  Administered 2021-04-11: 2 g via INTRAVENOUS
  Filled 2021-04-11: qty 100

## 2021-04-11 MED ORDER — BUPIVACAINE-EPINEPHRINE (PF) 0.25% -1:200000 IJ SOLN
INTRAMUSCULAR | Status: AC
  Filled 2021-04-11: qty 30

## 2021-04-11 MED ORDER — OXYCODONE HCL 5 MG PO TABS
5.0000 mg | ORAL_TABLET | ORAL | Status: DC | PRN
  Administered 2021-04-11 – 2021-04-14 (×11): 5 mg via ORAL
  Filled 2021-04-11 (×11): qty 1

## 2021-04-11 MED ORDER — FENTANYL CITRATE (PF) 250 MCG/5ML IJ SOLN
INTRAMUSCULAR | Status: DC | PRN
  Administered 2021-04-11 (×3): 100 ug via INTRAVENOUS

## 2021-04-11 MED ORDER — PHENYLEPHRINE 40 MCG/ML (10ML) SYRINGE FOR IV PUSH (FOR BLOOD PRESSURE SUPPORT)
PREFILLED_SYRINGE | INTRAVENOUS | Status: AC
  Filled 2021-04-11: qty 10

## 2021-04-11 MED ORDER — OXYCODONE HCL 5 MG PO TABS: 5.0000 mg | ORAL_TABLET | Freq: Four times a day (QID) | ORAL | 0 refills | Status: DC | PRN

## 2021-04-11 MED ORDER — MIDAZOLAM HCL 2 MG/2ML IJ SOLN
INTRAMUSCULAR | Status: DC | PRN
  Administered 2021-04-11: 2 mg via INTRAVENOUS

## 2021-04-11 SURGICAL SUPPLY — 38 items
APPLIER CLIP 5 13 M/L LIGAMAX5 (MISCELLANEOUS) ×2
BAG COUNTER SPONGE SURGICOUNT (BAG) IMPLANT
BLADE CLIPPER SURG (BLADE) IMPLANT
CANISTER SUCT 3000ML PPV (MISCELLANEOUS) ×2 IMPLANT
CHLORAPREP W/TINT 26 (MISCELLANEOUS) ×2 IMPLANT
CLIP APPLIE 5 13 M/L LIGAMAX5 (MISCELLANEOUS) ×1 IMPLANT
CNTNR URN SCR LID CUP LEK RST (MISCELLANEOUS) ×1 IMPLANT
CONT SPEC 4OZ STRL OR WHT (MISCELLANEOUS) ×1
COVER SURGICAL LIGHT HANDLE (MISCELLANEOUS) ×2 IMPLANT
DERMABOND ADVANCED (GAUZE/BANDAGES/DRESSINGS) ×1
DERMABOND ADVANCED .7 DNX12 (GAUZE/BANDAGES/DRESSINGS) ×1 IMPLANT
ELECT REM PT RETURN 9FT ADLT (ELECTROSURGICAL) ×2
ELECTRODE REM PT RTRN 9FT ADLT (ELECTROSURGICAL) ×1 IMPLANT
GLOVE SURG ENC MOIS LTX SZ7 (GLOVE) ×2 IMPLANT
GLOVE SURG UNDER POLY LF SZ7.5 (GLOVE) ×2 IMPLANT
GOWN STRL REUS W/ TWL LRG LVL3 (GOWN DISPOSABLE) ×3 IMPLANT
GOWN STRL REUS W/TWL LRG LVL3 (GOWN DISPOSABLE) ×6
GRASPER SUT TROCAR 14GX15 (MISCELLANEOUS) ×2 IMPLANT
KIT BASIN OR (CUSTOM PROCEDURE TRAY) ×2 IMPLANT
KIT TURNOVER KIT B (KITS) ×2 IMPLANT
NS IRRIG 1000ML POUR BTL (IV SOLUTION) IMPLANT
PAD ARMBOARD 7.5X6 YLW CONV (MISCELLANEOUS) ×2 IMPLANT
POUCH RETRIEVAL ECOSAC 10 (ENDOMECHANICALS) ×1 IMPLANT
POUCH RETRIEVAL ECOSAC 10MM (ENDOMECHANICALS) ×2
SCISSORS LAP 5X35 DISP (ENDOMECHANICALS) ×2 IMPLANT
SET IRRIG TUBING LAPAROSCOPIC (IRRIGATION / IRRIGATOR) ×2 IMPLANT
SET TUBE SMOKE EVAC HIGH FLOW (TUBING) ×2 IMPLANT
SLEEVE ENDOPATH XCEL 5M (ENDOMECHANICALS) ×4 IMPLANT
SPECIMEN JAR SMALL (MISCELLANEOUS) IMPLANT
STRIP CLOSURE SKIN 1/2X4 (GAUZE/BANDAGES/DRESSINGS) ×2 IMPLANT
SUT MNCRL AB 4-0 PS2 18 (SUTURE) ×2 IMPLANT
SUT VICRYL 0 UR6 27IN ABS (SUTURE) ×2 IMPLANT
TOWEL GREEN STERILE (TOWEL DISPOSABLE) ×2 IMPLANT
TOWEL GREEN STERILE FF (TOWEL DISPOSABLE) ×2 IMPLANT
TRAY LAPAROSCOPIC MC (CUSTOM PROCEDURE TRAY) ×2 IMPLANT
TROCAR XCEL BLUNT TIP 100MML (ENDOMECHANICALS) ×2 IMPLANT
TROCAR XCEL NON-BLD 5MMX100MML (ENDOMECHANICALS) ×2 IMPLANT
WATER STERILE IRR 1000ML POUR (IV SOLUTION) IMPLANT

## 2021-04-11 NOTE — Discharge Instructions (Signed)
[facility] LAPAROSCOPIC SURGERY: POST OP INSTRUCTIONS  Always review your discharge instruction sheet given to you by the facility where your surgery was performed. IF YOU HAVE DISABILITY OR FAMILY LEAVE FORMS, YOU MUST BRING THEM TO THE OFFICE FOR PROCESSING.   DO NOT GIVE THEM TO YOUR DOCTOR.  A prescription for pain medication may be given to you upon discharge.  Take your pain medication as prescribed, if needed.  If narcotic pain medicine is not needed, then you may take acetaminophen (Tylenol), naprosyn (Alleve), or ibuprofen (Advil) as needed. Take your usually prescribed medications unless otherwise directed. If you need a refill on your pain medication, please contact your pharmacy.  They will contact our office to request authorization. Prescriptions will not be filled after 5pm or on week-ends. You should follow a light diet the first few days after arrival home, such as soup and crackers, etc.  Be sure to include lots of fluids daily. Most patients will experience some swelling and bruising in the area of the incisions.  Ice packs will help.  Swelling and bruising can take several days to resolve.  It is common to experience some constipation if taking pain medication after surgery.  Increasing fluid intake and taking a stool softener (such as Colace) will usually help or prevent this problem from occurring.  A mild laxative (Milk of Magnesia or Miralax) should be taken according to package instructions if there are no bowel movements after 48 hours. Unless discharge instructions indicate otherwise, you may remove your bandages 48 hours after surgery, and you may shower at that time.  You may have steri-strips (small skin tapes) in place directly over the incision.  These strips should be left on the skin for 7-10 days.  If your surgeon used skin glue on the incision, you may shower in 24 hours.  The glue will flake off over the next  2-3 weeks.  Any sutures or staples will be removed at the office during your follow-up visit. ACTIVITIES:  You may resume regular (light) daily activities beginning the next day--such as daily self-care, walking, climbing stairs--gradually increasing activities as tolerated.  You may have sexual intercourse when it is comfortable.  Refrain from any heavy lifting or straining until approved by your doctor. You may drive when you are no longer taking prescription pain medication, you can comfortably wear a seatbelt, and you can safely maneuver your car and apply brakes. RETURN TO WORK:  __________________________________________________________ You should see your doctor in the office for a follow-up appointment approximately 2-3 weeks after your surgery.  Make sure that you call for this appointment within a day or two after you arrive home to insure a convenient appointment time. OTHER INSTRUCTIONS: __________________________________________________________________________________________________________________________ __________________________________________________________________________________________________________________________ WHEN TO CALL YOUR DOCTOR: Fever over 101.0 Inability to urinate Continued bleeding from incision. Increased pain, redness, or drainage from the incision. Increasing abdominal pain  The clinic staff is available to answer your questions during regular business hours.  Please don't hesitate to call and ask to speak to one of the nurses for clinical concerns.  If you have a medical emergency, go to the nearest emergency room or call 911.  A surgeon from [facility] is always on call at the hospital. [address], [address], [place], [place]  [address] ? [address] [address], [place], [place]   [address] [phone] ? [phone] ? FAX [phone] Web site: [email or web address]  

## 2021-04-11 NOTE — Transfer of Care (Signed)
Immediate Anesthesia Transfer of Care Note  Patient: [name]  Procedure(s) Performed: LAPAROSCOPIC CHOLECYSTECTOMY (Abdomen)  Patient Location: PACU  Anesthesia Type:General  Level of Consciousness: patient cooperative and responds to stimulation  Airway & Oxygen Therapy: Patient Spontanous Breathing and Patient connected to nasal cannula oxygen  Post-op Assessment: Report given to RN and Post -op Vital signs reviewed and stable  Post vital signs: Reviewed and stable  Last Vitals:  Vitals Value Taken Time  BP 142/57 04/11/21 1256  Temp 36.1 C 04/11/21 1256  Pulse 75 04/11/21 1257  Resp 15 04/11/21 1257  SpO2 95 % 04/11/21 1257  Vitals shown include unvalidated device data.  Last Pain:  Vitals:   04/11/21 1124  TempSrc: Oral  PainSc: 3       Patients Stated Pain Goal: 3 (04/11/21 1124)  Complications: No notable events documented.

## 2021-04-11 NOTE — Interval H&P Note (Signed)
History and Physical Interval Note:  04/11/2021 11:34 AM  I have seen and evaluated patient. Reviewed imagine. Plan for lap chole today. I discussed the procedure in detail.  We discussed the risks and benefits of a laparoscopic cholecystectomy and possible cholangiogram including, but not limited to bleeding, infection, injury to surrounding structures such as the intestine or liver, bile leak, retained gallstones, need to convert to an open procedure, prolonged diarrhea, blood clots such as  DVT, common bile duct injury, anesthesia risks, and possible need for additional procedures.  The likelihood of improvement in symptoms and return to the patient's normal status is good. We discussed the typical post-operative recovery course.  [name]  has presented today for surgery, with the diagnosis of CHOLECYSTITIS.  The various methods of treatment have been discussed with the patient and family. After consideration of risks, benefits and other options for treatment, the patient has consented to  Procedure(s): LAPAROSCOPIC CHOLECYSTECTOMY (N/A) as a surgical intervention.  The patient's history has been reviewed, patient examined, no change in status, stable for surgery.  I have reviewed the patient's chart and labs.  Questions were answered to the patient's satisfaction.     [name]

## 2021-04-11 NOTE — H&P (Signed)
[name] 11/22/1955  [id number].    Chief Complaint/Reason for Consult: abdominal pain  HPI:  [name] is a 65 yo female who presented to the [facility] ED with RUQ abdominal pain that began several days ago. It is worse at night and is associated with some nausea. It is in the right flank and RUQ. WBC and LFTs are normal. A RUQ [place] in the ED showed thickening of the gallbladder wall with stones vs polyps. She was directly admitted to [facility] for surgical evaluation.   Prior abdominal surgeries include 3 C sections and a tubal ligation..  ROS: Review of Systems  Constitutional:  Positive for malaise/fatigue. Negative for chills and fever.  HENT:  Negative for sore throat.   Respiratory:  Negative for shortness of breath, wheezing and stridor.   Gastrointestinal:  Positive for abdominal pain and nausea. Negative for vomiting.  Neurological:  Negative for seizures and weakness.   Family History  Problem Relation Age of Onset   Cancer Maternal Uncle    Emphysema Maternal Uncle    Stroke Other    Asthma Mother    Mitral valve prolapse Mother    Thyroid disease Mother    Heart disease Brother    Mitral valve prolapse Brother    Cancer Maternal Aunt        uterine and liver and kidney   Diabetes Maternal Grandmother    Heart disease Maternal Grandmother    Thyroid disease Maternal Grandmother    Diabetes Paternal Grandmother    Diabetes Paternal Grandfather    Colon cancer Paternal Uncle    Stomach cancer Neg Hx    Rectal cancer Neg Hx     Past Medical History:  Diagnosis Date   Acid reflux    Apocrine metaplasia of breast    Asthma    BV (bacterial vaginosis)    Depression    h/o   Endometriosis    h/o   HSV-1 (herpes simplex virus 1) infection    Hyperlipidemia    Hypothyroid    Kidney disease    Obese    Seasonal allergies     Past Surgical History:  Procedure Laterality Date   ABDOMINAL HYSTERECTOMY     CESAREAN SECTION     x3   dental work      cap placed 02-02-16   LEG SURGERY     had non cancerous tumors removed from left leg   SINUS ENDO W/FUSION     x3   TUBAL LIGATION     WISDOM TOOTH EXTRACTION      Social History:  reports that she has never smoked. She has never used smokeless tobacco. She reports current alcohol use. She reports that she does not use drugs.  Allergies:  Allergies  Allergen Reactions   Erythromycin     Abd pain   Amoxicillin-Pot Clavulanate Nausea And Vomiting   Darvocet [Propoxyphene N-Acetaminophen] Nausea And Vomiting    Medications Prior to Admission  Medication Sig Dispense Refill   acetaminophen (TYLENOL) 500 MG tablet Take 2 tablets (1,000 mg total) by mouth every 6 (six) hours as needed for moderate pain or fever. 30 tablet 0   ascorbic acid (VITAMIN C) 500 MG tablet Take 500 mg by mouth daily.     ASMANEX HFA 100 MCG/ACT AERO Inhale 100 mcg into the lungs as needed.     azelastine (ASTELIN) 0.1 % nasal spray Place 1 spray into both nostrils 2 (two) times daily as needed for allergies.  Reported on 01/23/2016     benzonatate (TESSALON PERLES) 100 MG capsule Take 2 capsules (200 mg total) by mouth 3 (three) times daily as needed for cough. 20 capsule 0   buPROPion (WELLBUTRIN XL) 150 MG 24 hr tablet Take 1 tablet by mouth every morning.     chlorpheniramine-HYDROcodone (TUSSIONEX PENNKINETIC ER) 10-8 MG/5ML SUER Take 5 mLs by mouth every 12 (twelve) hours as needed for cough. 140 mL 0   Cholecalciferol 50 MCG (2000 UT) CAPS Take 50 mcg by mouth daily.     cyclobenzaprine (FLEXERIL) 10 MG tablet Take 1 tablet by mouth daily as needed.     doxycycline (VIBRAMYCIN) 100 MG capsule Take 1 capsule (100 mg total) by mouth 2 (two) times daily. 14 capsule 0   HYDROcodone bit-homatropine (HYCODAN) 5-1.5 MG/5ML syrup Take 5 mLs by mouth every 6 (six) hours as needed for cough. 120 mL 0   ipratropium (ATROVENT) 0.06 % nasal spray ipratropium bromide 42 mcg (0.06 %) nasal spray     levothyroxine  (SYNTHROID) 75 MCG tablet 1 tablet in the morning on an empty stomach     meclizine (ANTIVERT) 25 MG tablet Take 25 mg by mouth every 6 (six) hours as needed.     Multiple Vitamins-Minerals (MULTIVITAMINS THER. W/MINERALS) TABS Take 1 tablet by mouth daily.     nortriptyline (PAMELOR) 50 MG capsule Take 50 mg by mouth daily.     Omega-3 Fatty Acids (OMEGA 3 PO) Take 1 tablet by mouth daily.     omeprazole (PRILOSEC) 40 MG capsule Take 40 mg by mouth daily.     ondansetron (ZOFRAN ODT) 4 MG disintegrating tablet Take 1 tablet (4 mg total) by mouth every 4 (four) hours as needed for nausea or vomiting. 20 tablet 0   ondansetron (ZOFRAN) 8 MG tablet Take 8 mg by mouth as needed.     polyvinyl alcohol (LIQUIFILM TEARS) 1.4 % ophthalmic solution Place 1 drop into both eyes daily as needed for dry eyes.     rizatriptan (MAXALT-MLT) 10 MG disintegrating tablet Take 10 mg by mouth daily as needed.     rosuvastatin (CRESTOR) 40 MG tablet Take 40 mg by mouth daily.     scopolamine (TRANSDERM-SCOP) 1 MG/3DAYS 1 patch to skin behind the ear as needed     valACYclovir (VALTREX) 500 MG tablet Take 500 mg by mouth as needed.       Physical Exam: Blood pressure (!) 132/55, pulse 66, temperature 98.6 F (37 C), temperature source Oral, resp. rate 17, height 5\' 6"  (1.676 m), weight 92.9 kg, SpO2 100 %. General: resting comfortably, appears stated age, no apparent distress Neurological: alert and oriented, no focal deficits, cranial nerves grossly in tact HEENT: normocephalic, atraumatic, oropharynx clear, no scleral icterus CV: extremities warm and well-perfused Respiratory: normal work of breathing, symmetric chest wall expansion Abdomen: soft, nondistended, focally tender to palpation in the RUQ.  Extremities: warm and well-perfused, no deformities, moving all extremities spontaneously Psychiatric: normal mood and affect Skin: warm and dry, no jaundice, no rashes or lesions   Results for orders placed  or performed during the hospital encounter of 04/10/21 (from the past 48 hour(s))  Urinalysis, Routine w reflex microscopic Urine, Clean Catch     Status: Abnormal   Collection Time: 04/10/21  1:42 PM  Result Value Ref Range   Color, Urine YELLOW YELLOW   APPearance CLEAR CLEAR   Specific Gravity, Urine 1.021 1.005 - 1.030   pH 6.5 5.0 - 8.0  Glucose, UA NEGATIVE NEGATIVE mg/dL   Hgb urine dipstick NEGATIVE NEGATIVE   Bilirubin Urine NEGATIVE NEGATIVE   Ketones, ur NEGATIVE NEGATIVE mg/dL   Protein, ur NEGATIVE NEGATIVE mg/dL   Nitrite NEGATIVE NEGATIVE   Leukocytes,Ua TRACE (A) NEGATIVE   RBC / HPF 0-5 0 - 5 RBC/hpf   WBC, UA 0-5 0 - 5 WBC/hpf   Squamous Epithelial / LPF 0-5 0 - 5   Mucus PRESENT     Comment: Performed at [facility], [address] [place], [place] [address]  Lipase, blood     Status: None   Collection Time: 04/10/21  2:25 PM  Result Value Ref Range   Lipase 22 11 - 51 U/L    Comment: Performed at [facility], [address] [place], [place] [address]  Comprehensive metabolic panel     Status: Abnormal   Collection Time: 04/10/21  2:25 PM  Result Value Ref Range   Sodium 142 135 - 145 mmol/L   Potassium 4.3 3.5 - 5.1 mmol/L   Chloride 108 98 - 111 mmol/L   CO2 28 22 - 32 mmol/L   Glucose, Bld 91 70 - 99 mg/dL    Comment: Glucose reference range applies only to samples taken after fasting for at least 8 hours.   BUN 16 8 - 23 mg/dL   Creatinine, Ser 7.61 (H) 0.44 - 1.00 mg/dL   Calcium 9.0 8.9 - 60.7 mg/dL   Total Protein 6.5 6.5 - 8.1 g/dL   Albumin 3.7 3.5 - 5.0 g/dL   AST 19 15 - 41 U/L   ALT 17 0 - 44 U/L   Alkaline Phosphatase 52 38 - 126 U/L   Total Bilirubin 0.4 0.3 - 1.2 mg/dL   GFR, Estimated 55 (L) >60 mL/min    Comment: (NOTE) Calculated using the CKD-EPI Creatinine Equation (2021)    Anion gap 6 5 - 15    Comment: Performed at [facility], [address] [place],  [place] [address]  CBC     Status: Abnormal   Collection Time: 04/10/21  2:25 PM  Result Value Ref Range   WBC 6.1 4.0 - 10.5 K/uL   RBC 4.14 3.87 - 5.11 MIL/uL   Hemoglobin 11.5 (L) 12.0 - 15.0 g/dL   HCT 26.9 48.5 - 46.2 %   MCV 87.2 80.0 - 100.0 fL   MCH 27.8 26.0 - 34.0 pg   MCHC 31.9 30.0 - 36.0 g/dL   RDW 70.3 50.0 - 93.8 %   Platelets 204 150 - 400 K/uL   nRBC 0.0 0.0 - 0.2 %    Comment: Performed at [facility], [address] [place], [place] [address]  Resp Panel by RT-PCR (Flu A&B, Covid) Nasopharyngeal Swab     Status: None   Collection Time: 04/10/21  4:52 PM   Specimen: Nasopharyngeal Swab; Nasopharyngeal(NP) swabs in vial transport medium  Result Value Ref Range   SARS Coronavirus 2 by RT PCR NEGATIVE NEGATIVE    Comment: (NOTE) SARS-CoV-2 target nucleic acids are NOT DETECTED.  The SARS-CoV-2 RNA is generally detectable in upper respiratory specimens during the acute phase of infection. The lowest concentration of SARS-CoV-2 viral copies this assay can detect is 138 copies/mL. A negative result does not preclude SARS-Cov-2 infection and should not be used as the sole basis for treatment or other patient management decisions. A negative result may occur with  improper specimen collection/handling, submission of specimen other than nasopharyngeal swab, presence of viral  mutation(s) within the areas targeted by this assay, and inadequate number of viral copies(<138 copies/mL). A negative result must be combined with clinical observations, patient history, and epidemiological information. The expected result is Negative.  Fact Sheet for Patients:  BloggerCourse.com  Fact Sheet for Healthcare Providers:  SeriousBroker.it  This test is no t yet approved or cleared by the [place] FDA and  has been authorized for detection and/or diagnosis of SARS-CoV-2 by FDA under an Emergency Use Authorization  (EUA). This EUA will remain  in effect (meaning this test can be used) for the duration of the COVID-19 declaration under Section 564(b)(1) of the Act, 21 U.S.C.section 360bbb-3(b)(1), unless the authorization is terminated  or revoked sooner.       Influenza A by PCR NEGATIVE NEGATIVE   Influenza B by PCR NEGATIVE NEGATIVE    Comment: (NOTE) The Xpert Xpress SARS-CoV-2/FLU/RSV plus assay is intended as an aid in the diagnosis of influenza from Nasopharyngeal swab specimens and should not be used as a sole basis for treatment. Nasal washings and aspirates are unacceptable for Xpert Xpress SARS-CoV-2/FLU/RSV testing.  Fact Sheet for Patients: BloggerCourse.com  Fact Sheet for Healthcare Providers: SeriousBroker.it  This test is not yet approved or cleared by the [place] FDA and has been authorized for detection and/or diagnosis of SARS-CoV-2 by FDA under an Emergency Use Authorization (EUA). This EUA will remain in effect (meaning this test can be used) for the duration of the COVID-19 declaration under Section 564(b)(1) of the Act, 21 U.S.C. section 360bbb-3(b)(1), unless the authorization is terminated or revoked.  Performed at [facility], [address] [place], [place] [address]    US Abdomen Limited RUQ (LIVER/GB)  Result Date: 04/10/2021 CLINICAL DATA:  Right upper quadrant pain for 3 days EXAM: ULTRASOUND ABDOMEN LIMITED RIGHT UPPER QUADRANT COMPARISON:  CT abdomen/pelvis 07/25/2015, ultrasound 05/2014 FINDINGS: Gallbladder: Mural adherent gallstones versus polyps identified measuring up to 8 mm. The gallbladder wall is thickened measuring up to 6 mm. A positive sonographic Murphy's sign is reported. The fat around the gallbladder appears echogenic and inflamed. Common bile duct: Diameter: 5 mm Liver: A small hypoechoic lesion is seen in the left hepatic lobe likely corresponding to a small  hypodense lesion seen on the prior CT from 2016, most likely reflecting a cyst. There are no other focal lesions. Portal vein is patent on color Doppler imaging with normal direction of blood flow towards the liver. Other: None. IMPRESSION: 1. Gallbladder wall thickening and positive sonographic Murphy's sign concerning for acute cholecystitis. No shadowing stones are identified. 2. Mural adherent gallstones versus polyps measuring up to 8 mm. If cholecystectomy is not performed, follow-up ultrasound is recommended in 1 year. Electronically Signed   By: Lesia Hausen M.D.   On: 04/10/2021 15:53      Assessment/Plan 65 yo female presenting with several days of worsening right upper quadrant abdominal pain.  I personally reviewed her ultrasound, which shows diffuse thickening of the gallbladder wall, as well as some possible intraluminal sludge and stones vs polyps.  Given wall thickening and focal tenderness on exam, her symptoms are consistent with biliary colic and possible early cholecystitis.  I recommended laparoscopic cholecystectomy, and I discussed the details of this procedure with her and the risks and benefits including bleeding, infection, and less than 0.5% risk of common bile duct injury.  She expressed understanding and agrees to proceed with surgery. - NPO for OR - Maintenance IV fluids - Antibiotics given in the ED -Pain  and nausea control - VTE: lovenox, SCDs - Dispo: admit to observation, OR during the daytime for lap cholecystectomy  Sophronia Simas, MD [facility] Surgery General, Hepatobiliary and Pancreatic Surgery 04/11/21 1:42 AM

## 2021-04-11 NOTE — Op Note (Addendum)
Preoperative diagnosis: Acute cholecystitis Postoperative diagnosis: Same as above Procedure: Laparoscopic cholecystectomy with ICG dye Surgeon: Dr. Harden Mo Anesthesia: General Estimated blood loss: 25 cc Specimens: Gallbladder and contents to pathology Complications: None Drains: None Sponge and count was correct completion Disposition to recovery stable condition  Indications: This is a 65 year old female with several days of continuous right upper quadrant abdominal pain.  This associated with nausea.  She has normal white blood cell count and liver function test.  She has tender in her right upper quadrant on examination and ultrasound showed thickening of her gallbladder wall with stones versus polyps.  I discussed with her going to the operating room for laparoscopic cholecystectomy as it.  She had cholecystitis on her examination.  Procedure: After informed consent was obtained the patient was taken to the operating.  She was given antibiotics.  SCDs were in place.  She was placed under general anesthesia without complication.  She was prepped and draped in the standard sterile surgical fashion.  A surgical timeout was then performed.  I filtrated Marcaine below the umbilicus and made a vertical incision.  I grasped the fascia.  I incised this sharply.  I entered the peritoneum bluntly.  I placed a 0 Vicryl pursestring suture through the fascia.  I placed a Hassan trocar and insufflated the abdomen to 15 mmHg pressure.  I then inserted 3 additional 5 mm trochars in the epigastrium and right upper quadrant under direct vision without complication.  The gallbladder was noted to have acute cholecystitis.  I retracted this cephalad and lateral.  I was able to dissect the triangle of Calot and get the critical view of safety.  I confirmed that I was looking at the cystic duct using ICG dye where I was able to visualize the common bile duct.  I then clipped the cystic duct 3 times dividing it  leaving 2 clips in place.  I treated the artery in a similar fashion.  There was also a small posterior branch that I clipped as well.  The gallbladder was then removed from the liver bed.  She also appeared to have some chronic disease as it was very adherent.  I then placed the gallbladder retrieval bag and removed from the abdomen.  I then obtained hemostasis on the liver.  I did place a piece of Surgicel snow in the liver bed.  I then removed my Hassan trocar.  I tied my pursestring down.  I placed an additional 0 Vicryl suture using the suture passer device to completely obliterate that defect.  I then removed all trochars and desufflated the abdomen.  I closed these with 4-0 Monocryl and glue.  She tolerated this well was extubated and transferred to recovery stable.

## 2021-04-11 NOTE — Anesthesia Procedure Notes (Signed)
Procedure Name: Intubation Date/Time: 04/11/2021 11:52 AM Performed by: Betha Loa, CRNA Pre-anesthesia Checklist: Patient identified, Emergency Drugs available, Suction available and Patient being monitored Patient Re-evaluated:Patient Re-evaluated prior to induction Oxygen Delivery Method: Circle System Utilized Preoxygenation: Pre-oxygenation with 100% oxygen Induction Type: IV induction Ventilation: Mask ventilation without difficulty Laryngoscope Size: Mac and 3 Grade View: Grade I Tube type: Oral Tube size: 7.0 mm Number of attempts: 1 Airway Equipment and Method: Stylet and Oral airway Placement Confirmation: ETT inserted through vocal cords under direct vision, positive ETCO2 and breath sounds checked- equal and bilateral Secured at: 21 cm Tube secured with: Tape Dental Injury: Teeth and Oropharynx as per pre-operative assessment

## 2021-04-11 NOTE — Progress Notes (Signed)
Received patient transfer from [facility] via [facility].  Pt is ambulatory, VSS, pain at 3/10, pre-op done.  On-call CCS was made aware of patient's arrival to unit and Dr. Eliot Ford called back and will see the patient on the Unit.  Pt. Ambulated to bathroom to void.

## 2021-04-11 NOTE — Anesthesia Preprocedure Evaluation (Signed)
Anesthesia Evaluation  Patient identified by MRN, date of birth, ID band Patient awake    Reviewed: Allergy & Precautions, NPO status , Patient's Chart, lab work & pertinent test results  History of Anesthesia Complications Negative for: history of anesthetic complications  Airway Mallampati: II  TM Distance: >3 FB Neck ROM: Full    Dental  (+) Dental Advisory Given, Teeth Intact   Pulmonary neg shortness of breath, asthma , neg sleep apnea, neg COPD, neg recent URI,    breath sounds clear to auscultation       Cardiovascular negative cardio ROS   Rhythm:Regular     Neuro/Psych  Headaches, PSYCHIATRIC DISORDERS Depression    GI/Hepatic Neg liver ROS, GERD  ,CHOLECYSTITIS   Endo/Other  Hypothyroidism   Renal/GU Renal diseaseLab Results      Component                Value               Date                      CREATININE               1.11 (H)            04/10/2021                Musculoskeletal negative musculoskeletal ROS (+)   Abdominal   Peds  Hematology negative hematology ROS (+)   Anesthesia Other Findings   Reproductive/Obstetrics                             Anesthesia Physical Anesthesia Plan  ASA: 2  Anesthesia Plan: General   Post-op Pain Management:    Induction: Intravenous  PONV Risk Score and Plan: 3 and Ondansetron and Dexamethasone  Airway Management Planned: Oral ETT  Additional Equipment: None  Intra-op Plan:   Post-operative Plan: Extubation in OR  Informed Consent: I have reviewed the patients History and Physical, chart, labs and discussed the procedure including the risks, benefits and alternatives for the proposed anesthesia with the patient or authorized representative who has indicated his/her understanding and acceptance.     Dental advisory given  Plan Discussed with: CRNA and Anesthesiologist  Anesthesia Plan Comments:          Anesthesia Quick Evaluation

## 2021-04-12 DIAGNOSIS — Z20822 Contact with and (suspected) exposure to covid-19: Secondary | ICD-10-CM | POA: Diagnosis present

## 2021-04-12 DIAGNOSIS — E039 Hypothyroidism, unspecified: Secondary | ICD-10-CM | POA: Diagnosis present

## 2021-04-12 DIAGNOSIS — Z888 Allergy status to other drugs, medicaments and biological substances status: Secondary | ICD-10-CM | POA: Diagnosis not present

## 2021-04-12 DIAGNOSIS — M79662 Pain in left lower leg: Secondary | ICD-10-CM | POA: Diagnosis not present

## 2021-04-12 DIAGNOSIS — M79661 Pain in right lower leg: Secondary | ICD-10-CM | POA: Diagnosis not present

## 2021-04-12 DIAGNOSIS — Z800 Family history of malignant neoplasm of digestive organs: Secondary | ICD-10-CM | POA: Diagnosis not present

## 2021-04-12 DIAGNOSIS — Z79890 Hormone replacement therapy (postmenopausal): Secondary | ICD-10-CM | POA: Diagnosis not present

## 2021-04-12 DIAGNOSIS — Z79899 Other long term (current) drug therapy: Secondary | ICD-10-CM | POA: Diagnosis not present

## 2021-04-12 DIAGNOSIS — Z833 Family history of diabetes mellitus: Secondary | ICD-10-CM | POA: Diagnosis not present

## 2021-04-12 DIAGNOSIS — E785 Hyperlipidemia, unspecified: Secondary | ICD-10-CM | POA: Diagnosis present

## 2021-04-12 DIAGNOSIS — Z8249 Family history of ischemic heart disease and other diseases of the circulatory system: Secondary | ICD-10-CM | POA: Diagnosis not present

## 2021-04-12 DIAGNOSIS — R600 Localized edema: Secondary | ICD-10-CM | POA: Diagnosis present

## 2021-04-12 DIAGNOSIS — Z881 Allergy status to other antibiotic agents status: Secondary | ICD-10-CM | POA: Diagnosis not present

## 2021-04-12 DIAGNOSIS — Z8349 Family history of other endocrine, nutritional and metabolic diseases: Secondary | ICD-10-CM | POA: Diagnosis not present

## 2021-04-12 DIAGNOSIS — Z825 Family history of asthma and other chronic lower respiratory diseases: Secondary | ICD-10-CM | POA: Diagnosis not present

## 2021-04-12 DIAGNOSIS — K219 Gastro-esophageal reflux disease without esophagitis: Secondary | ICD-10-CM | POA: Diagnosis present

## 2021-04-12 DIAGNOSIS — K810 Acute cholecystitis: Secondary | ICD-10-CM | POA: Diagnosis present

## 2021-04-12 DIAGNOSIS — Z880 Allergy status to penicillin: Secondary | ICD-10-CM | POA: Diagnosis not present

## 2021-04-12 LAB — COMPREHENSIVE METABOLIC PANEL
ALT: 49 U/L — ABNORMAL HIGH (ref 0–44)
AST: 64 U/L — ABNORMAL HIGH (ref 15–41)
Albumin: 3.7 g/dL (ref 3.5–5.0)
Alkaline Phosphatase: 56 U/L (ref 38–126)
Anion gap: 8 (ref 5–15)
BUN: 18 mg/dL (ref 8–23)
CO2: 26 mmol/L (ref 22–32)
Calcium: 9.6 mg/dL (ref 8.9–10.3)
Chloride: 100 mmol/L (ref 98–111)
Creatinine, Ser: 1.33 mg/dL — ABNORMAL HIGH (ref 0.44–1.00)
GFR, Estimated: 44 mL/min — ABNORMAL LOW (ref >60)
Glucose, Bld: 142 mg/dL — ABNORMAL HIGH (ref 70–99)
Potassium: 3.7 mmol/L (ref 3.5–5.1)
Sodium: 134 mmol/L — ABNORMAL LOW (ref 135–145)
Total Bilirubin: 0.5 mg/dL (ref 0.3–1.2)
Total Protein: 6.7 g/dL (ref 6.5–8.1)

## 2021-04-12 LAB — CBC
HCT: 38 % (ref 36.0–46.0)
Hemoglobin: 12.1 g/dL (ref 12.0–15.0)
MCH: 28.1 pg (ref 26.0–34.0)
MCHC: 31.8 g/dL (ref 30.0–36.0)
MCV: 88.4 fL (ref 80.0–100.0)
Platelets: 265 10*3/uL (ref 150–400)
RBC: 4.3 MIL/uL (ref 3.87–5.11)
RDW: 13 % (ref 11.5–15.5)
WBC: 22.7 10*3/uL — ABNORMAL HIGH (ref 4.0–10.5)
nRBC: 0 % (ref 0.0–0.2)

## 2021-04-12 MED ORDER — KETOROLAC TROMETHAMINE 15 MG/ML IJ SOLN
15.0000 mg | Freq: Three times a day (TID) | INTRAMUSCULAR | Status: DC | PRN
  Administered 2021-04-12 – 2021-04-14 (×2): 15 mg via INTRAVENOUS
  Filled 2021-04-12 (×2): qty 1

## 2021-04-12 MED ORDER — HYDROMORPHONE HCL 1 MG/ML IJ SOLN
0.5000 mg | INTRAMUSCULAR | Status: DC | PRN
  Administered 2021-04-12 – 2021-04-13 (×2): 0.5 mg via INTRAVENOUS
  Filled 2021-04-12: qty 0.5

## 2021-04-12 NOTE — Progress Notes (Signed)
Patient ID: [name], female   DOB: Jan 05, 1956, 65 y.o.   MRN: [id number] [facility] Progress Note:   1 Day Post-Op  Subjective: Mental status is alert.  Complaints right upper quadrant pain with shallow respirations. Objective: Vital signs in last 24 hours: Temp:  [97 F (36.1 C)-98.5 F (36.9 C)] 98.1 F (36.7 C) (09/04 0508) Pulse Rate:  [66-86] 76 (09/04 0508) Resp:  [12-20] 16 (09/04 0508) BP: (120-154)/(57-93) 120/65 (09/04 0508) SpO2:  [92 %-100 %] 95 % (09/04 0508)  Intake/Output from previous day: 09/03 0701 - 09/04 0700 In: 2266.7 [P.O.:240; I.V.:1926.7; IV Piggyback:100] Out: 0  Intake/Output this shift: No intake/output data recorded.  Physical Exam: Work of breathing is slightly increased with pain limiting inspiration.  Incisions from lap chole are OK  Lab Results:  Results for orders placed or performed during the hospital encounter of 04/10/21 (from the past 48 hour(s))  Urinalysis, Routine w reflex microscopic Urine, Clean Catch     Status: Abnormal   Collection Time: 04/10/21  1:42 PM  Result Value Ref Range   Color, Urine YELLOW YELLOW   APPearance CLEAR CLEAR   Specific Gravity, Urine 1.021 1.005 - 1.030   pH 6.5 5.0 - 8.0   Glucose, UA NEGATIVE NEGATIVE mg/dL   Hgb urine dipstick NEGATIVE NEGATIVE   Bilirubin Urine NEGATIVE NEGATIVE   Ketones, ur NEGATIVE NEGATIVE mg/dL   Protein, ur NEGATIVE NEGATIVE mg/dL   Nitrite NEGATIVE NEGATIVE   Leukocytes,Ua TRACE (A) NEGATIVE   RBC / HPF 0-5 0 - 5 RBC/hpf   WBC, UA 0-5 0 - 5 WBC/hpf   Squamous Epithelial / LPF 0-5 0 - 5   Mucus PRESENT     Comment: Performed at [facility], [address], [place], [place] [address]  Lipase, blood     Status: None   Collection Time: 04/10/21  2:25 PM  Result Value Ref Range   Lipase 22 11 - 51 U/L    Comment: Performed at [facility], [address], [place], [place] [address]  Comprehensive metabolic panel      Status: Abnormal   Collection Time: 04/10/21  2:25 PM  Result Value Ref Range   Sodium 142 135 - 145 mmol/L   Potassium 4.3 3.5 - 5.1 mmol/L   Chloride 108 98 - 111 mmol/L   CO2 28 22 - 32 mmol/L   Glucose, Bld 91 70 - 99 mg/dL    Comment: Glucose reference range applies only to samples taken after fasting for at least 8 hours.   BUN 16 8 - 23 mg/dL   Creatinine, Ser 8.67 (H) 0.44 - 1.00 mg/dL   Calcium 9.0 8.9 - 61.9 mg/dL   Total Protein 6.5 6.5 - 8.1 g/dL   Albumin 3.7 3.5 - 5.0 g/dL   AST 19 15 - 41 U/L   ALT 17 0 - 44 U/L   Alkaline Phosphatase 52 38 - 126 U/L   Total Bilirubin 0.4 0.3 - 1.2 mg/dL   GFR, Estimated 55 (L) >60 mL/min    Comment: (NOTE) Calculated using the CKD-EPI Creatinine Equation (2021)    Anion gap 6 5 - 15    Comment: Performed at [facility], [address], [place], [place] [address]  CBC     Status: Abnormal   Collection Time: 04/10/21  2:25 PM  Result Value Ref Range   WBC 6.1 4.0 - 10.5 K/uL   RBC 4.14 3.87 - 5.11 MIL/uL   Hemoglobin 11.5 (  L) 12.0 - 15.0 g/dL   HCT 21.3 08.6 - 57.8 %   MCV 87.2 80.0 - 100.0 fL   MCH 27.8 26.0 - 34.0 pg   MCHC 31.9 30.0 - 36.0 g/dL   RDW 46.9 62.9 - 52.8 %   Platelets 204 150 - 400 K/uL   nRBC 0.0 0.0 - 0.2 %    Comment: Performed at [facility], [address] [place], [place] [address]  Resp Panel by RT-PCR (Flu A&B, Covid) Nasopharyngeal Swab     Status: None   Collection Time: 04/10/21  4:52 PM   Specimen: Nasopharyngeal Swab; Nasopharyngeal(NP) swabs in vial transport medium  Result Value Ref Range   SARS Coronavirus 2 by RT PCR NEGATIVE NEGATIVE    Comment: (NOTE) SARS-CoV-2 target nucleic acids are NOT DETECTED.  The SARS-CoV-2 RNA is generally detectable in upper respiratory specimens during the acute phase of infection. The lowest concentration of SARS-CoV-2 viral copies this assay can detect is 138 copies/mL. A negative result does not preclude  SARS-Cov-2 infection and should not be used as the sole basis for treatment or other patient management decisions. A negative result may occur with  improper specimen collection/handling, submission of specimen other than nasopharyngeal swab, presence of viral mutation(s) within the areas targeted by this assay, and inadequate number of viral copies(<138 copies/mL). A negative result must be combined with clinical observations, patient history, and epidemiological information. The expected result is Negative.  Fact Sheet for Patients:  BloggerCourse.com  Fact Sheet for Healthcare Providers:  SeriousBroker.it  This test is no t yet approved or cleared by the [place] FDA and  has been authorized for detection and/or diagnosis of SARS-CoV-2 by FDA under an Emergency Use Authorization (EUA). This EUA will remain  in effect (meaning this test can be used) for the duration of the COVID-19 declaration under Section 564(b)(1) of the Act, 21 U.S.C.section 360bbb-3(b)(1), unless the authorization is terminated  or revoked sooner.       Influenza A by PCR NEGATIVE NEGATIVE   Influenza B by PCR NEGATIVE NEGATIVE    Comment: (NOTE) The Xpert Xpress SARS-CoV-2/FLU/RSV plus assay is intended as an aid in the diagnosis of influenza from Nasopharyngeal swab specimens and should not be used as a sole basis for treatment. Nasal washings and aspirates are unacceptable for Xpert Xpress SARS-CoV-2/FLU/RSV testing.  Fact Sheet for Patients: BloggerCourse.com  Fact Sheet for Healthcare Providers: SeriousBroker.it  This test is not yet approved or cleared by the [place] FDA and has been authorized for detection and/or diagnosis of SARS-CoV-2 by FDA under an Emergency Use Authorization (EUA). This EUA will remain in effect (meaning this test can be used) for the duration of the COVID-19  declaration under Section 564(b)(1) of the Act, 21 U.S.C. section 360bbb-3(b)(1), unless the authorization is terminated or revoked.  Performed at [facility], [address], [place], [place] [address]   Surgical pcr screen     Status: None   Collection Time: 04/11/21  1:31 AM   Specimen: Nasal Mucosa; Nasal Swab  Result Value Ref Range   MRSA, PCR NEGATIVE NEGATIVE   Staphylococcus aureus NEGATIVE NEGATIVE    Comment: (NOTE) The Xpert SA Assay (FDA approved for NASAL specimens in patients 74 years of age and older), is one component of a comprehensive surveillance program. It is not intended to diagnose infection nor to guide or monitor treatment. Performed at [facility], [address] [place], [place] [address]  Radiology/Results: US Abdomen Limited RUQ (LIVER/GB)  Result Date: 04/10/2021 CLINICAL DATA:  Right upper quadrant pain for 3 days EXAM: ULTRASOUND ABDOMEN LIMITED RIGHT UPPER QUADRANT COMPARISON:  CT abdomen/pelvis 07/25/2015, ultrasound 05/2014 FINDINGS: Gallbladder: Mural adherent gallstones versus polyps identified measuring up to 8 mm. The gallbladder wall is thickened measuring up to 6 mm. A positive sonographic Murphy's sign is reported. The fat around the gallbladder appears echogenic and inflamed. Common bile duct: Diameter: 5 mm Liver: A small hypoechoic lesion is seen in the left hepatic lobe likely corresponding to a small hypodense lesion seen on the prior CT from 2016, most likely reflecting a cyst. There are no other focal lesions. Portal vein is patent on color Doppler imaging with normal direction of blood flow towards the liver. Other: None. IMPRESSION: 1. Gallbladder wall thickening and positive sonographic Murphy's sign concerning for acute cholecystitis. No shadowing stones are identified. 2. Mural adherent gallstones versus polyps measuring up to 8 mm. If cholecystectomy is not performed, follow-up ultrasound is recommended  in 1 year. Electronically Signed   By: Lesia Hausen M.D.   On: 04/10/2021 15:53    Anti-infectives: Anti-infectives (From admission, onward)    Start     Dose/Rate Route Frequency Ordered Stop   04/11/21 1700  cefTRIAXone (ROCEPHIN) 2 g in sodium chloride 0.9 % 100 mL IVPB  Status:  Discontinued        2 g 200 mL/hr over 30 Minutes Intravenous Every 24 hours 04/11/21 0045 04/11/21 0153   04/11/21 1300  ceFAZolin (ANCEF) IVPB 2g/100 mL premix        2 g 200 mL/hr over 30 Minutes Intravenous On call to O.R. 04/11/21 0409 04/11/21 1153   04/10/21 1615  cefTRIAXone (ROCEPHIN) 2 g in sodium chloride 0.9 % 100 mL IVPB        2 g 200 mL/hr over 30 Minutes Intravenous  Once 04/10/21 1601 04/10/21 1730       Assessment/Plan: Problem List: Patient Active Problem List   Diagnosis Date Noted   Acute cholecystitis 04/10/2021   RUQ abdominal pain 05/14/2014   Abdominal pain, epigastric 05/14/2014   Nausea without vomiting 05/14/2014   Hypothyroidism 05/14/2014   GERD (gastroesophageal reflux disease) 05/14/2014   Asthma, chronic 05/14/2014    She is having more pain this am.  Will check CBC stat.  Not ready for discharge today.   1 Day Post-Op    LOS: 0 days   Matt B. Daphine Deutscher, MD, [facility] [phone] to reach the surgeon on call.    04/12/2021 8:19 AM

## 2021-04-12 NOTE — Plan of Care (Signed)
  Problem: Nutrition: Goal: Adequate nutrition will be maintained Outcome: Progressing   Problem: Elimination: Goal: Will not experience complications related to bowel motility Outcome: Progressing   Problem: Pain Managment: Goal: General experience of comfort will improve Outcome: Progressing   Problem: Skin Integrity: Goal: Risk for impaired skin integrity will decrease Outcome: Progressing   

## 2021-04-12 NOTE — Care Management Obs Status (Signed)
MEDICARE OBSERVATION STATUS NOTIFICATION   Patient Details  Name: [name] MRN: [id number] Date of Birth: [birth year]   Medicare Observation Status Notification Given:  Yes    Bess Kinds, RN 04/12/2021, 9:09 AM

## 2021-04-13 ENCOUNTER — Inpatient Hospital Stay (HOSPITAL_COMMUNITY): Payer: PPO

## 2021-04-13 ENCOUNTER — Encounter (HOSPITAL_COMMUNITY): Payer: Self-pay | Admitting: General Surgery

## 2021-04-13 DIAGNOSIS — Z9049 Acquired absence of other specified parts of digestive tract: Secondary | ICD-10-CM

## 2021-04-13 LAB — CBC WITH DIFFERENTIAL/PLATELET
Abs Immature Granulocytes: 0.03 10*3/uL (ref 0.00–0.07)
Basophils Absolute: 0 10*3/uL (ref 0.0–0.1)
Basophils Relative: 0 %
Eosinophils Absolute: 0.1 10*3/uL (ref 0.0–0.5)
Eosinophils Relative: 1 %
HCT: 32.3 % — ABNORMAL LOW (ref 36.0–46.0)
Hemoglobin: 10.2 g/dL — ABNORMAL LOW (ref 12.0–15.0)
Immature Granulocytes: 0 %
Lymphocytes Relative: 21 %
Lymphs Abs: 2.3 10*3/uL (ref 0.7–4.0)
MCH: 28.2 pg (ref 26.0–34.0)
MCHC: 31.6 g/dL (ref 30.0–36.0)
MCV: 89.2 fL (ref 80.0–100.0)
Monocytes Absolute: 0.7 10*3/uL (ref 0.1–1.0)
Monocytes Relative: 6 %
Neutro Abs: 7.9 10*3/uL — ABNORMAL HIGH (ref 1.7–7.7)
Neutrophils Relative %: 72 %
Platelets: 183 10*3/uL (ref 150–400)
RBC: 3.62 MIL/uL — ABNORMAL LOW (ref 3.87–5.11)
RDW: 13.2 % (ref 11.5–15.5)
WBC: 11 10*3/uL — ABNORMAL HIGH (ref 4.0–10.5)
nRBC: 0 % (ref 0.0–0.2)

## 2021-04-13 MED ORDER — TECHNETIUM TC 99M MEBROFENIN IV KIT
5.4000 | PACK | Freq: Once | INTRAVENOUS | Status: AC | PRN
  Administered 2021-04-13: 5.4 via INTRAVENOUS

## 2021-04-13 NOTE — Anesthesia Postprocedure Evaluation (Signed)
Anesthesia Post Note  Patient: [name]  Procedure(s) Performed: LAPAROSCOPIC CHOLECYSTECTOMY (Abdomen)     Patient location during evaluation: PACU Anesthesia Type: General Level of consciousness: awake and alert Pain management: pain level controlled Vital Signs Assessment: post-procedure vital signs reviewed and stable Respiratory status: spontaneous breathing, nonlabored ventilation, respiratory function stable and patient connected to nasal cannula oxygen Cardiovascular status: blood pressure returned to baseline and stable Postop Assessment: no apparent nausea or vomiting Anesthetic complications: no   No notable events documented.  Last Vitals:  Vitals:   04/13/21 0356 04/13/21 1325  BP: (!) 118/58 (!) 114/56  Pulse: 68 66  Resp: 18 17  Temp: 36.7 C 36.9 C  SpO2: 97% 95%    Last Pain:  Vitals:   04/13/21 1325  TempSrc: Oral  PainSc:                  [name]

## 2021-04-13 NOTE — Progress Notes (Signed)
Patient ID: [name], female   DOB: [birth year], 65 y.o.   MRN: [id number] [facility] Progress Note:   2 Days Post-Op  Subjective: Mental status is clear.  Complaints right upper quadrant pain radiating into her back-limiting inspiration. Objective: Vital signs in last 24 hours: Temp:  [98 F (36.7 C)-98.6 F (37 C)] 98 F (36.7 C) (09/05 0356) Pulse Rate:  [66-73] 68 (09/05 0356) Resp:  [16-18] 18 (09/05 0356) BP: (118-125)/(52-71) 118/58 (09/05 0356) SpO2:  [95 %-100 %] 97 % (09/05 0356)  Intake/Output from previous day: 09/04 0701 - 09/05 0700 In: 2117.5 [P.O.:938; I.V.:1179.5] Out: -  Intake/Output this shift: No intake/output data recorded.  Physical Exam: Work of breathing is effected by right upper quadrant pain.  Incisions are without redness  Lab Results:  Results for orders placed or performed during the hospital encounter of 04/10/21 (from the past 48 hour(s))  CBC     Status: Abnormal   Collection Time: 04/12/21  8:27 AM  Result Value Ref Range   WBC 22.7 (H) 4.0 - 10.5 K/uL   RBC 4.30 3.87 - 5.11 MIL/uL   Hemoglobin 12.1 12.0 - 15.0 g/dL   HCT 97.9 89.2 - 11.9 %   MCV 88.4 80.0 - 100.0 fL   MCH 28.1 26.0 - 34.0 pg   MCHC 31.8 30.0 - 36.0 g/dL   RDW 41.7 40.8 - 14.4 %   Platelets 265 150 - 400 K/uL   nRBC 0.0 0.0 - 0.2 %    Comment: Performed at [facility], [address], [place], [place] [address]  Comprehensive metabolic panel     Status: Abnormal   Collection Time: 04/12/21  2:17 PM  Result Value Ref Range   Sodium 134 (L) 135 - 145 mmol/L   Potassium 3.7 3.5 - 5.1 mmol/L   Chloride 100 98 - 111 mmol/L   CO2 26 22 - 32 mmol/L   Glucose, Bld 142 (H) 70 - 99 mg/dL    Comment: Glucose reference range applies only to samples taken after fasting for at least 8 hours.   BUN 18 8 - 23 mg/dL   Creatinine, Ser 3.14 (H) 0.44 - 1.00 mg/dL   Calcium 9.6 8.9 - 97.0 mg/dL   Total Protein 6.7 6.5 - 8.1 g/dL   Albumin 3.7 3.5 - 5.0 g/dL    AST 64 (H) 15 - 41 U/L   ALT 49 (H) 0 - 44 U/L   Alkaline Phosphatase 56 38 - 126 U/L   Total Bilirubin 0.5 0.3 - 1.2 mg/dL   GFR, Estimated 44 (L) >60 mL/min    Comment: (NOTE) Calculated using the CKD-EPI Creatinine Equation (2021)    Anion gap 8 5 - 15    Comment: Performed at [facility], [address], [place], [place] [address]    Radiology/Results: No results found.  Anti-infectives: Anti-infectives (From admission, onward)    Start     Dose/Rate Route Frequency Ordered Stop   04/11/21 1700  cefTRIAXone (ROCEPHIN) 2 g in sodium chloride 0.9 % 100 mL IVPB  Status:  Discontinued        2 g 200 mL/hr over 30 Minutes Intravenous Every 24 hours 04/11/21 0045 04/11/21 0153   04/11/21 1300  ceFAZolin (ANCEF) IVPB 2g/100 mL premix        2 g 200 mL/hr over 30 Minutes Intravenous On call to O.R. 04/11/21 0409 04/11/21 1153   04/10/21 1615  cefTRIAXone (ROCEPHIN) 2 g in sodium chloride  0.9 % 100 mL IVPB        2 g 200 mL/hr over 30 Minutes Intravenous  Once 04/10/21 1601 04/10/21 1730       Assessment/Plan: Problem List: Patient Active Problem List   Diagnosis Date Noted   Acute cholecystitis 04/10/2021   RUQ abdominal pain 05/14/2014   Abdominal pain, epigastric 05/14/2014   Nausea without vomiting 05/14/2014   Hypothyroidism 05/14/2014   GERD (gastroesophageal reflux disease) 05/14/2014   Asthma, chronic 05/14/2014    Will check HIDA scan for bile leak.  Repeat CBC;  cxr because she had pneumonia in July 2 Days Post-Op    LOS: 1 day   Matt B. Daphine Deutscher, MD, [facility] [phone] to reach the surgeon on call.    04/13/2021 8:42 AM

## 2021-04-14 ENCOUNTER — Inpatient Hospital Stay (HOSPITAL_COMMUNITY): Payer: PPO

## 2021-04-14 DIAGNOSIS — M79662 Pain in left lower leg: Secondary | ICD-10-CM

## 2021-04-14 DIAGNOSIS — M79661 Pain in right lower leg: Secondary | ICD-10-CM

## 2021-04-14 LAB — CBC
HCT: 31.2 % — ABNORMAL LOW (ref 36.0–46.0)
Hemoglobin: 9.6 g/dL — ABNORMAL LOW (ref 12.0–15.0)
MCH: 27.8 pg (ref 26.0–34.0)
MCHC: 30.8 g/dL (ref 30.0–36.0)
MCV: 90.4 fL (ref 80.0–100.0)
Platelets: 173 10*3/uL (ref 150–400)
RBC: 3.45 MIL/uL — ABNORMAL LOW (ref 3.87–5.11)
RDW: 13.4 % (ref 11.5–15.5)
WBC: 6.3 10*3/uL (ref 4.0–10.5)
nRBC: 0 % (ref 0.0–0.2)

## 2021-04-14 LAB — COMPREHENSIVE METABOLIC PANEL
ALT: 27 U/L (ref 0–44)
AST: 24 U/L (ref 15–41)
Albumin: 2.7 g/dL — ABNORMAL LOW (ref 3.5–5.0)
Alkaline Phosphatase: 40 U/L (ref 38–126)
Anion gap: 9 (ref 5–15)
BUN: 11 mg/dL (ref 8–23)
CO2: 25 mmol/L (ref 22–32)
Calcium: 8.6 mg/dL — ABNORMAL LOW (ref 8.9–10.3)
Chloride: 104 mmol/L (ref 98–111)
Creatinine, Ser: 0.98 mg/dL (ref 0.44–1.00)
GFR, Estimated: >60 mL/min (ref >60)
Glucose, Bld: 80 mg/dL (ref 70–99)
Potassium: 3.8 mmol/L (ref 3.5–5.1)
Sodium: 138 mmol/L (ref 135–145)
Total Bilirubin: 0.3 mg/dL (ref 0.3–1.2)
Total Protein: 5 g/dL — ABNORMAL LOW (ref 6.5–8.1)

## 2021-04-14 MED ORDER — OXYCODONE HCL 5 MG PO TABS
5.0000 mg | ORAL_TABLET | ORAL | Status: DC | PRN
  Administered 2021-04-14 – 2021-04-15 (×2): 10 mg via ORAL
  Filled 2021-04-14 (×3): qty 2

## 2021-04-14 MED ORDER — SENNOSIDES-DOCUSATE SODIUM 8.6-50 MG PO TABS
1.0000 | ORAL_TABLET | Freq: Every day | ORAL | Status: DC
  Administered 2021-04-14: 1 via ORAL
  Filled 2021-04-14: qty 1

## 2021-04-14 MED ORDER — POLYETHYLENE GLYCOL 3350 17 G PO PACK
17.0000 g | PACK | Freq: Two times a day (BID) | ORAL | Status: DC | PRN
  Administered 2021-04-14: 17 g via ORAL
  Filled 2021-04-14: qty 1

## 2021-04-14 MED ORDER — METHOCARBAMOL 500 MG PO TABS
500.0000 mg | ORAL_TABLET | Freq: Four times a day (QID) | ORAL | Status: DC
  Administered 2021-04-14: 500 mg via ORAL
  Filled 2021-04-14: qty 1

## 2021-04-14 MED ORDER — METHOCARBAMOL 500 MG PO TABS
1000.0000 mg | ORAL_TABLET | Freq: Three times a day (TID) | ORAL | Status: DC
  Administered 2021-04-14: 1000 mg via ORAL
  Filled 2021-04-14 (×2): qty 2

## 2021-04-14 MED ORDER — FUROSEMIDE 10 MG/ML IJ SOLN
20.0000 mg | Freq: Once | INTRAMUSCULAR | Status: AC
  Administered 2021-04-14: 20 mg via INTRAVENOUS
  Filled 2021-04-14: qty 2

## 2021-04-14 MED ORDER — KETOROLAC TROMETHAMINE 15 MG/ML IJ SOLN
15.0000 mg | Freq: Four times a day (QID) | INTRAMUSCULAR | Status: DC
  Filled 2021-04-14: qty 1

## 2021-04-14 MED ORDER — BISACODYL 10 MG RE SUPP: 10.0000 mg | Freq: Every day | RECTAL | Status: DC | PRN

## 2021-04-14 NOTE — Plan of Care (Signed)

## 2021-04-14 NOTE — Progress Notes (Signed)
Lower extremity venous bilateral study completed.   Please see CV Proc for preliminary results.   Londell Noll, RDMS, RVT  

## 2021-04-14 NOTE — Progress Notes (Addendum)
3 Days Post-Op  Subjective: CC: Patient reports that POD 1 - 2 she was having RUQ abdominal pain that was worse with deep inspiration. She notes that yesterday into today this has greatly improved. Still having some soreness in her RUQ but not having inspiratory pain. Still feels she is behind on pain control. On RA. Pulling 1000 on IS. Mobilizing in halls. Voiding. Small BM this am. Only has taken small amounts of her dinner and breakfast this am but denies increased abdominal pain after eating or associated n/v. Complains of b/l calf pain and swelling. Daughter is at bedside. She asked that I d/c her Wellbutrin and Pamelor as she no longer takes these.   Objective: Vital signs in last 24 hours: Temp:  [98 F (36.7 C)-98.4 F (36.9 C)] 98.4 F (36.9 C) (09/06 0805) Pulse Rate:  [59-71] 71 (09/06 0805) Resp:  [16-17] 17 (09/06 0805) BP: (114-129)/(56-59) 129/57 (09/06 0805) SpO2:  [95 %-99 %] 98 % (09/06 0805) Last BM Date: 04/10/21  Intake/Output from previous day: 09/05 0701 - 09/06 0700 In: 770.6 [P.O.:460; I.V.:310.6] Out: -  Intake/Output this shift: Total I/O In: 240 [P.O.:240] Out: -   PE: Gen:  Alert, NAD, pleasant HEENT: EOM's intact, pupils equal and round Card:  RRR Pulm:  Slightly decreased breath sounds at the bases. Lungs otherwise cta b/l. Normal rate and effort on RA. 1000 on IS.  Abd: Soft, ND, appropriately tender around laparoscopic incisions with some tenderness of the ruq. No peritonitis. +BS, incisions with steri-strips intact and appear well and are without drainage, bleeding, or signs of infection Ext: Trace b/l LE edema with calf tenderness b/l Psych: A&Ox3  Skin: no rashes noted, warm and dry  Lab Results:  Recent Labs    04/12/21 0827 04/13/21 1009  WBC 22.7* 11.0*  HGB 12.1 10.2*  HCT 38.0 32.3*  PLT 265 183   BMET Recent Labs    04/12/21 1417  NA 134*  K 3.7  CL 100  CO2 26  GLUCOSE 142*  BUN 18  CREATININE 1.33*  CALCIUM 9.6    PT/INR No results for input(s): LABPROT, INR in the last 72 hours. CMP     Component Value Date/Time   NA 134 (L) 04/12/2021 1417   K 3.7 04/12/2021 1417   CL 100 04/12/2021 1417   CO2 26 04/12/2021 1417   GLUCOSE 142 (H) 04/12/2021 1417   BUN 18 04/12/2021 1417   CREATININE 1.33 (H) 04/12/2021 1417   CALCIUM 9.6 04/12/2021 1417   PROT 6.7 04/12/2021 1417   ALBUMIN 3.7 04/12/2021 1417   AST 64 (H) 04/12/2021 1417   ALT 49 (H) 04/12/2021 1417   ALKPHOS 56 04/12/2021 1417   BILITOT 0.5 04/12/2021 1417   GFRNONAA 44 (L) 04/12/2021 1417   GFRAA >60 08/24/2015 1315   Lipase     Component Value Date/Time   LIPASE 22 04/10/2021 1425    Studies/Results: DG Chest 2 View  Result Date: 04/13/2021 CLINICAL DATA:  Community-acquired pneumonia EXAM: CHEST - 2 VIEW COMPARISON:  02/20/2021 FINDINGS: Upper normal heart size. Mediastinal contours and pulmonary vascularity normal. Elevated RIGHT diaphragm. Streaky atelectasis LEFT base. Remaining lungs clear. No definite infiltrate, pleural effusion, or pneumothorax. Osseous structures unremarkable. IMPRESSION: Streaky LEFT basilar atelectasis. Electronically Signed   By: Lavonia Dana M.D.   On: 04/13/2021 10:43   NM HEPATOBILIARY LEAK (POST-SURGICAL)  Result Date: 04/13/2021 CLINICAL DATA:  Abdominal pain post cholecystectomy 2 days ago. Question bile leak. EXAM: NUCLEAR MEDICINE HEPATOBILIARY  IMAGING TECHNIQUE: Sequential images of the abdomen were obtained out to 60 minutes following intravenous administration of radiopharmaceutical. RADIOPHARMACEUTICALS:  5.2 mCi mCi Tc-6m Choletec IV COMPARISON:  Abdominal ultrasound 04/10/2021. FINDINGS: Prompt uptake and biliary excretion of activity by the liver is seen. Activity demonstrates normal progression within the small bowel. No extraluminal contrast collections are identified to suggest a bile leak. IMPRESSION: Normal examination status post cholecystectomy. No evidence of bile leak or biliary  obstruction. Electronically Signed   By: WRichardean SaleM.D.   On: 04/13/2021 17:38    Anti-infectives: Anti-infectives (From admission, onward)    Start     Dose/Rate Route Frequency Ordered Stop   04/11/21 1700  cefTRIAXone (ROCEPHIN) 2 g in sodium chloride 0.9 % 100 mL IVPB  Status:  Discontinued        2 g 200 mL/hr over 30 Minutes Intravenous Every 24 hours 04/11/21 0045 04/11/21 0153   04/11/21 1300  ceFAZolin (ANCEF) IVPB 2g/100 mL premix        2 g 200 mL/hr over 30 Minutes Intravenous On call to O.R. 04/11/21 0409 04/11/21 1153   04/10/21 1615  cefTRIAXone (ROCEPHIN) 2 g in sodium chloride 0.9 % 100 mL IVPB        2 g 200 mL/hr over 30 Minutes Intravenous  Once 04/10/21 1601 04/10/21 1730        Assessment/Plan POD 3 s/p Laparoscopic cholecystectomy with ICG dye - Dr. WDonne Hazel 04/11/21 for Acute Cholecystitis  - HIDA negative for leak - WBC improved from 22.7 > 11 yesterday. AST/ALT noted to be elevated POD 1, suspect 2/2 cauterization. Repeat labs today are pending. - Adv diet - Adjust pain control - Path pending  - Mobilize, she is ambulating in the halls - Pulm toilet, she is pulling 1000 on IS. Advised to use 10x/hour. She is on RA. CXR w/ atelectasis w/o PNA or other findings.   FEN - Reg, d/c IVF when tolerating diet  VTE - SCDs, Lovenox.  ID - Rocephin peri-op. None currently  Foley - Voiding.   LE edema - LE [place] Will also repeat a CXR today to ensure she is not developing pulm edema and requires lasix  GERD - PPI Asthma - duoneb prn HLD - home meds  Hypothyroid - home meds  CKD - d/c Toradol. Recheck met panel. Last Cr 1.33   LOS: 2 days    MJillyn Ledger, PCedar Springs Behavioral Health SystemSurgery 04/14/2021, 10:44 AM Please see Amion for pager number during day hours 7:00am-4:30pm

## 2021-04-15 LAB — BASIC METABOLIC PANEL
Anion gap: 5 (ref 5–15)
BUN: 14 mg/dL (ref 8–23)
CO2: 29 mmol/L (ref 22–32)
Calcium: 8.6 mg/dL — ABNORMAL LOW (ref 8.9–10.3)
Chloride: 105 mmol/L (ref 98–111)
Creatinine, Ser: 1.3 mg/dL — ABNORMAL HIGH (ref 0.44–1.00)
GFR, Estimated: 46 mL/min — ABNORMAL LOW (ref >60)
Glucose, Bld: 126 mg/dL — ABNORMAL HIGH (ref 70–99)
Potassium: 3.8 mmol/L (ref 3.5–5.1)
Sodium: 139 mmol/L (ref 135–145)

## 2021-04-15 LAB — SURGICAL PATHOLOGY

## 2021-04-15 NOTE — Discharge Summary (Signed)
Patient ID: [name] [id number] Aug 07, 1956 65 y.o.  Admit date: 04/10/2021 Discharge date: 04/15/2021  Admitting Diagnosis: Biliary colic and possible early cholecystitis  Discharge Diagnosis Patient Active Problem List   Diagnosis Date Noted   Status post cholecystectomy 04/13/2021   Acute cholecystitis 04/10/2021   RUQ abdominal pain 05/14/2014   Abdominal pain, epigastric 05/14/2014   Nausea without vomiting 05/14/2014   Hypothyroidism 05/14/2014   GERD (gastroesophageal reflux disease) 05/14/2014   Asthma, chronic 05/14/2014    Consultants None  H&P Ms. [name] is a 65 yo female who presented to the [facility] ED with RUQ abdominal pain that began several days ago. It is worse at night and is associated with some nausea. It is in the right flank and RUQ. WBC and LFTs are normal. A RUQ [place] in the ED showed thickening of the gallbladder wall with stones vs polyps. She was directly admitted to [facility] for surgical evaluation.    Prior abdominal surgeries include 3 C sections and a tubal ligation..  Procedures Dr. Dwain Sarna - Laparoscopic Cholecystectomy - 04/11/2021  Hospital Course:  Patient was admitted to [facility] for biliary colic and possible early cholecystitis. She underwent Laparoscopic Cholecystectomy by Dr. Dwain Sarna on 04/11/21. Patient tolerated the procedure well and was transferred to the floor. Post op patient underwent HIDA to r/o leak given persistent RUQ abdominal pain. HIDA was negative for leak. Labs reassuring on 9/6 w/ normal wbc and lft's. LE [place] negative for DVT. She was given lasix with improvement. On 9/7 patient reports that her RUQ abdominal pain has greatly improved and she mainly has soreness around her incisions that is well controlled with current medications. She denies CP or SOB. She was seen walking in the halls (several laps) independently. She is tolerating her diet without n/v. Reports she is voiding without difficulty. Vital signs reassuring and  stable. Patient felt safe for discharge home. Discharged instructions and precautions reviewed. Follow up as noted below.    Physical Exam: Gen:  Alert, NAD, pleasant HEENT: EOM's intact, pupils equal and round Card:  RRR Pulm:  CTA b/l, normal rate and effort on RA Abd: Soft, ND, appropriately tender around laparoscopic incisions. Otherwise NT. No peritonitis. +BS, incisions with steri-strips intact and appear well and are without drainage, bleeding, or signs of infection Ext: No LE edema Psych: A&Ox3  Skin: no rashes noted, warm and dry  Allergies as of 04/15/2021       Reactions   Erythromycin    Abd pain   Amoxicillin-pot Clavulanate Nausea And Vomiting   Darvocet [propoxyphene N-acetaminophen] Nausea And Vomiting        Medication List     STOP taking these medications    scopolamine 1 MG/3DAYS Commonly known as: TRANSDERM-SCOP       TAKE these medications    acetaminophen 500 MG tablet Commonly known as: TYLENOL Take 2 tablets (1,000 mg total) by mouth every 6 (six) hours as needed for moderate pain or fever.   ascorbic acid 500 MG tablet Commonly known as: VITAMIN C Take 500 mg by mouth daily.   Asmanex HFA 100 MCG/ACT Aero Generic drug: Mometasone Furoate Inhale 100 mcg into the lungs as needed.   benzonatate 100 MG capsule Commonly known as: Tessalon Perles Take 2 capsules (200 mg total) by mouth 3 (three) times daily as needed for cough.   chlorpheniramine-HYDROcodone 10-8 MG/5ML Suer Commonly known as: Tussionex Pennkinetic ER Take 5 mLs by mouth every 12 (twelve) hours as needed for cough.  cyclobenzaprine 10 MG tablet Commonly known as: FLEXERIL Take 1 tablet by mouth daily as needed.   doxycycline 100 MG capsule Commonly known as: VIBRAMYCIN Take 1 capsule (100 mg total) by mouth 2 (two) times daily.   HYDROcodone bit-homatropine 5-1.5 MG/5ML syrup Commonly known as: HYCODAN Take 5 mLs by mouth every 6 (six) hours as needed for cough.    levothyroxine 75 MCG tablet Commonly known as: SYNTHROID 1 tablet in the morning on an empty stomach   meclizine 25 MG tablet Commonly known as: ANTIVERT Take 25 mg by mouth every 6 (six) hours as needed.   multivitamins ther. w/minerals Tabs tablet Take 1 tablet by mouth daily.   OMEGA 3 PO Take 1 tablet by mouth daily.   omeprazole 40 MG capsule Commonly known as: PRILOSEC Take 40 mg by mouth daily.   ondansetron 4 MG disintegrating tablet Commonly known as: Zofran ODT Take 1 tablet (4 mg total) by mouth every 4 (four) hours as needed for nausea or vomiting.   ondansetron 8 MG tablet Commonly known as: ZOFRAN Take 8 mg by mouth as needed.   oxyCODONE 5 MG immediate release tablet Commonly known as: Oxy IR/ROXICODONE Take 1 tablet (5 mg total) by mouth every 6 (six) hours as needed for moderate pain, severe pain or breakthrough pain.   polyvinyl alcohol 1.4 % ophthalmic solution Commonly known as: LIQUIFILM TEARS Place 1 drop into both eyes daily as needed for dry eyes.   rizatriptan 10 MG disintegrating tablet Commonly known as: MAXALT-MLT Take 10 mg by mouth daily as needed.   rosuvastatin 40 MG tablet Commonly known as: CRESTOR Take 40 mg by mouth daily.          Follow-up Information     Surgery, [place]. Call in 1 day(s).   Specialty: General Surgery Why: Please call to confirm your appointment time. We are working hard to make this for you. Please bring a copy of your photo ID and insurance card. Please arrive 30 minutes prior to your appointment for paperwork. Contact information: [address] [place] [address] [phone]         Elias Else, MD Follow up.   Specialty: Family Medicine Why: For follow up Contact information: [address] [place] [address] [phone]               Creatinine at baseline range from labs in July. Patient should follow up with PCP as outpatient.    Signed: Leary Roca, [facility] Surgery 04/15/2021, 9:33 AM Please see Amion for pager number during day hours 7:00am-4:30pm

## 2021-04-22 DIAGNOSIS — G4700 Insomnia, unspecified: Secondary | ICD-10-CM | POA: Diagnosis not present

## 2021-04-22 DIAGNOSIS — K810 Acute cholecystitis: Secondary | ICD-10-CM | POA: Diagnosis not present

## 2021-04-22 DIAGNOSIS — F324 Major depressive disorder, single episode, in partial remission: Secondary | ICD-10-CM | POA: Diagnosis not present

## 2021-04-22 DIAGNOSIS — F419 Anxiety disorder, unspecified: Secondary | ICD-10-CM | POA: Diagnosis not present

## 2021-04-22 DIAGNOSIS — Z9049 Acquired absence of other specified parts of digestive tract: Secondary | ICD-10-CM | POA: Diagnosis not present

## 2021-04-29 DIAGNOSIS — Z01419 Encounter for gynecological examination (general) (routine) without abnormal findings: Secondary | ICD-10-CM | POA: Diagnosis not present

## 2021-04-29 DIAGNOSIS — R102 Pelvic and perineal pain: Secondary | ICD-10-CM | POA: Diagnosis not present

## 2021-04-29 DIAGNOSIS — M545 Low back pain, unspecified: Secondary | ICD-10-CM | POA: Diagnosis not present

## 2021-04-29 DIAGNOSIS — M8580 Other specified disorders of bone density and structure, unspecified site: Secondary | ICD-10-CM | POA: Diagnosis not present

## 2021-04-29 DIAGNOSIS — R309 Painful micturition, unspecified: Secondary | ICD-10-CM | POA: Diagnosis not present

## 2021-04-29 DIAGNOSIS — Z6832 Body mass index (BMI) 32.0-32.9, adult: Secondary | ICD-10-CM | POA: Diagnosis not present

## 2021-05-01 DIAGNOSIS — Z1231 Encounter for screening mammogram for malignant neoplasm of breast: Secondary | ICD-10-CM | POA: Diagnosis not present

## 2021-05-05 DIAGNOSIS — R109 Unspecified abdominal pain: Secondary | ICD-10-CM | POA: Diagnosis not present

## 2021-05-05 DIAGNOSIS — G8918 Other acute postprocedural pain: Secondary | ICD-10-CM | POA: Diagnosis not present

## 2021-05-11 DIAGNOSIS — M5136 Other intervertebral disc degeneration, lumbar region: Secondary | ICD-10-CM | POA: Diagnosis not present

## 2021-05-11 DIAGNOSIS — M9904 Segmental and somatic dysfunction of sacral region: Secondary | ICD-10-CM | POA: Diagnosis not present

## 2021-05-11 DIAGNOSIS — M9905 Segmental and somatic dysfunction of pelvic region: Secondary | ICD-10-CM | POA: Diagnosis not present

## 2021-05-11 DIAGNOSIS — M9903 Segmental and somatic dysfunction of lumbar region: Secondary | ICD-10-CM | POA: Diagnosis not present

## 2021-05-13 DIAGNOSIS — M9904 Segmental and somatic dysfunction of sacral region: Secondary | ICD-10-CM | POA: Diagnosis not present

## 2021-05-13 DIAGNOSIS — M5136 Other intervertebral disc degeneration, lumbar region: Secondary | ICD-10-CM | POA: Diagnosis not present

## 2021-05-13 DIAGNOSIS — M9903 Segmental and somatic dysfunction of lumbar region: Secondary | ICD-10-CM | POA: Diagnosis not present

## 2021-05-13 DIAGNOSIS — M9905 Segmental and somatic dysfunction of pelvic region: Secondary | ICD-10-CM | POA: Diagnosis not present

## 2021-05-15 DIAGNOSIS — R928 Other abnormal and inconclusive findings on diagnostic imaging of breast: Secondary | ICD-10-CM | POA: Diagnosis not present

## 2021-05-15 DIAGNOSIS — R922 Inconclusive mammogram: Secondary | ICD-10-CM | POA: Diagnosis not present

## 2021-05-18 DIAGNOSIS — M9903 Segmental and somatic dysfunction of lumbar region: Secondary | ICD-10-CM | POA: Diagnosis not present

## 2021-05-18 DIAGNOSIS — M9904 Segmental and somatic dysfunction of sacral region: Secondary | ICD-10-CM | POA: Diagnosis not present

## 2021-05-18 DIAGNOSIS — M5136 Other intervertebral disc degeneration, lumbar region: Secondary | ICD-10-CM | POA: Diagnosis not present

## 2021-05-18 DIAGNOSIS — M9905 Segmental and somatic dysfunction of pelvic region: Secondary | ICD-10-CM | POA: Diagnosis not present

## 2021-05-21 DIAGNOSIS — M9905 Segmental and somatic dysfunction of pelvic region: Secondary | ICD-10-CM | POA: Diagnosis not present

## 2021-05-21 DIAGNOSIS — M5136 Other intervertebral disc degeneration, lumbar region: Secondary | ICD-10-CM | POA: Diagnosis not present

## 2021-05-21 DIAGNOSIS — M9904 Segmental and somatic dysfunction of sacral region: Secondary | ICD-10-CM | POA: Diagnosis not present

## 2021-05-21 DIAGNOSIS — M9903 Segmental and somatic dysfunction of lumbar region: Secondary | ICD-10-CM | POA: Diagnosis not present

## 2021-05-25 DIAGNOSIS — M9905 Segmental and somatic dysfunction of pelvic region: Secondary | ICD-10-CM | POA: Diagnosis not present

## 2021-05-25 DIAGNOSIS — M5136 Other intervertebral disc degeneration, lumbar region: Secondary | ICD-10-CM | POA: Diagnosis not present

## 2021-05-25 DIAGNOSIS — M9904 Segmental and somatic dysfunction of sacral region: Secondary | ICD-10-CM | POA: Diagnosis not present

## 2021-05-25 DIAGNOSIS — M9903 Segmental and somatic dysfunction of lumbar region: Secondary | ICD-10-CM | POA: Diagnosis not present

## 2021-05-28 DIAGNOSIS — M9905 Segmental and somatic dysfunction of pelvic region: Secondary | ICD-10-CM | POA: Diagnosis not present

## 2021-05-28 DIAGNOSIS — M5136 Other intervertebral disc degeneration, lumbar region: Secondary | ICD-10-CM | POA: Diagnosis not present

## 2021-05-28 DIAGNOSIS — M9904 Segmental and somatic dysfunction of sacral region: Secondary | ICD-10-CM | POA: Diagnosis not present

## 2021-05-28 DIAGNOSIS — M9903 Segmental and somatic dysfunction of lumbar region: Secondary | ICD-10-CM | POA: Diagnosis not present

## 2021-06-09 DIAGNOSIS — F324 Major depressive disorder, single episode, in partial remission: Secondary | ICD-10-CM | POA: Diagnosis not present

## 2021-06-09 DIAGNOSIS — M5441 Lumbago with sciatica, right side: Secondary | ICD-10-CM | POA: Diagnosis not present

## 2021-06-09 DIAGNOSIS — G4700 Insomnia, unspecified: Secondary | ICD-10-CM | POA: Diagnosis not present

## 2021-06-09 DIAGNOSIS — F419 Anxiety disorder, unspecified: Secondary | ICD-10-CM | POA: Diagnosis not present

## 2021-06-15 DIAGNOSIS — M9904 Segmental and somatic dysfunction of sacral region: Secondary | ICD-10-CM | POA: Diagnosis not present

## 2021-06-15 DIAGNOSIS — M5136 Other intervertebral disc degeneration, lumbar region: Secondary | ICD-10-CM | POA: Diagnosis not present

## 2021-06-15 DIAGNOSIS — M9905 Segmental and somatic dysfunction of pelvic region: Secondary | ICD-10-CM | POA: Diagnosis not present

## 2021-06-15 DIAGNOSIS — M9903 Segmental and somatic dysfunction of lumbar region: Secondary | ICD-10-CM | POA: Diagnosis not present

## 2021-06-22 DIAGNOSIS — M9903 Segmental and somatic dysfunction of lumbar region: Secondary | ICD-10-CM | POA: Diagnosis not present

## 2021-06-22 DIAGNOSIS — M9904 Segmental and somatic dysfunction of sacral region: Secondary | ICD-10-CM | POA: Diagnosis not present

## 2021-06-22 DIAGNOSIS — M5136 Other intervertebral disc degeneration, lumbar region: Secondary | ICD-10-CM | POA: Diagnosis not present

## 2021-06-22 DIAGNOSIS — M9905 Segmental and somatic dysfunction of pelvic region: Secondary | ICD-10-CM | POA: Diagnosis not present

## 2021-06-29 DIAGNOSIS — M9905 Segmental and somatic dysfunction of pelvic region: Secondary | ICD-10-CM | POA: Diagnosis not present

## 2021-06-29 DIAGNOSIS — M5136 Other intervertebral disc degeneration, lumbar region: Secondary | ICD-10-CM | POA: Diagnosis not present

## 2021-06-29 DIAGNOSIS — M9904 Segmental and somatic dysfunction of sacral region: Secondary | ICD-10-CM | POA: Diagnosis not present

## 2021-06-29 DIAGNOSIS — M9903 Segmental and somatic dysfunction of lumbar region: Secondary | ICD-10-CM | POA: Diagnosis not present

## 2021-07-08 DIAGNOSIS — M9903 Segmental and somatic dysfunction of lumbar region: Secondary | ICD-10-CM | POA: Diagnosis not present

## 2021-07-08 DIAGNOSIS — M9905 Segmental and somatic dysfunction of pelvic region: Secondary | ICD-10-CM | POA: Diagnosis not present

## 2021-07-08 DIAGNOSIS — M9904 Segmental and somatic dysfunction of sacral region: Secondary | ICD-10-CM | POA: Diagnosis not present

## 2021-07-08 DIAGNOSIS — M5136 Other intervertebral disc degeneration, lumbar region: Secondary | ICD-10-CM | POA: Diagnosis not present

## 2021-07-09 DIAGNOSIS — M9903 Segmental and somatic dysfunction of lumbar region: Secondary | ICD-10-CM | POA: Diagnosis not present

## 2021-07-09 DIAGNOSIS — M9904 Segmental and somatic dysfunction of sacral region: Secondary | ICD-10-CM | POA: Diagnosis not present

## 2021-07-09 DIAGNOSIS — M9905 Segmental and somatic dysfunction of pelvic region: Secondary | ICD-10-CM | POA: Diagnosis not present

## 2021-07-09 DIAGNOSIS — M5136 Other intervertebral disc degeneration, lumbar region: Secondary | ICD-10-CM | POA: Diagnosis not present

## 2021-07-13 DIAGNOSIS — M5136 Other intervertebral disc degeneration, lumbar region: Secondary | ICD-10-CM | POA: Diagnosis not present

## 2021-07-13 DIAGNOSIS — M9905 Segmental and somatic dysfunction of pelvic region: Secondary | ICD-10-CM | POA: Diagnosis not present

## 2021-07-13 DIAGNOSIS — M9904 Segmental and somatic dysfunction of sacral region: Secondary | ICD-10-CM | POA: Diagnosis not present

## 2021-07-13 DIAGNOSIS — M9903 Segmental and somatic dysfunction of lumbar region: Secondary | ICD-10-CM | POA: Diagnosis not present

## 2021-07-20 DIAGNOSIS — M9905 Segmental and somatic dysfunction of pelvic region: Secondary | ICD-10-CM | POA: Diagnosis not present

## 2021-07-20 DIAGNOSIS — M9904 Segmental and somatic dysfunction of sacral region: Secondary | ICD-10-CM | POA: Diagnosis not present

## 2021-07-20 DIAGNOSIS — M9903 Segmental and somatic dysfunction of lumbar region: Secondary | ICD-10-CM | POA: Diagnosis not present

## 2021-07-20 DIAGNOSIS — M5136 Other intervertebral disc degeneration, lumbar region: Secondary | ICD-10-CM | POA: Diagnosis not present

## 2021-07-29 DIAGNOSIS — M5431 Sciatica, right side: Secondary | ICD-10-CM | POA: Diagnosis not present

## 2021-08-04 DIAGNOSIS — U071 COVID-19: Secondary | ICD-10-CM | POA: Diagnosis not present

## 2021-08-12 DIAGNOSIS — M5431 Sciatica, right side: Secondary | ICD-10-CM | POA: Diagnosis not present

## 2021-08-14 DIAGNOSIS — M5431 Sciatica, right side: Secondary | ICD-10-CM | POA: Diagnosis not present

## 2021-08-17 DIAGNOSIS — M5431 Sciatica, right side: Secondary | ICD-10-CM | POA: Diagnosis not present

## 2021-08-20 ENCOUNTER — Ambulatory Visit: Payer: PPO | Admitting: Cardiovascular Disease

## 2021-08-21 DIAGNOSIS — M5431 Sciatica, right side: Secondary | ICD-10-CM | POA: Diagnosis not present

## 2021-08-24 DIAGNOSIS — M5431 Sciatica, right side: Secondary | ICD-10-CM | POA: Diagnosis not present

## 2021-08-26 DIAGNOSIS — H04123 Dry eye syndrome of bilateral lacrimal glands: Secondary | ICD-10-CM | POA: Diagnosis not present

## 2021-08-26 DIAGNOSIS — H52223 Regular astigmatism, bilateral: Secondary | ICD-10-CM | POA: Diagnosis not present

## 2021-08-26 DIAGNOSIS — H5203 Hypermetropia, bilateral: Secondary | ICD-10-CM | POA: Diagnosis not present

## 2021-08-26 DIAGNOSIS — H524 Presbyopia: Secondary | ICD-10-CM | POA: Diagnosis not present

## 2021-08-27 DIAGNOSIS — E039 Hypothyroidism, unspecified: Secondary | ICD-10-CM | POA: Diagnosis not present

## 2021-08-27 DIAGNOSIS — E785 Hyperlipidemia, unspecified: Secondary | ICD-10-CM | POA: Diagnosis not present

## 2021-08-27 DIAGNOSIS — M5431 Sciatica, right side: Secondary | ICD-10-CM | POA: Diagnosis not present

## 2021-08-27 DIAGNOSIS — N1832 Chronic kidney disease, stage 3b: Secondary | ICD-10-CM | POA: Diagnosis not present

## 2021-08-27 DIAGNOSIS — K219 Gastro-esophageal reflux disease without esophagitis: Secondary | ICD-10-CM | POA: Diagnosis not present

## 2021-08-27 DIAGNOSIS — J45909 Unspecified asthma, uncomplicated: Secondary | ICD-10-CM | POA: Diagnosis not present

## 2021-09-15 NOTE — Progress Notes (Signed)
Cardiology Office Note:   Date:  09/16/2021  NAME:  [name]    MRN: [id number] DOB:  07-Mar-1956   PCP:  Maury Dus, MD  Cardiologist:  None  Electrophysiologist:  None   Referring MD: Maury Dus, MD   Chief Complaint  Patient presents with   Follow-up        History of Present Illness:   [name] is a 66 y.o. female with a hx of hypothyroidism, CKD who presents for follow-up. Seen last year for CP. CAC score 0. NM stress normal. Echo showed normal LV function.   She reports she continues to have episodes of heaviness in her chest.  Occurs in the left side of her chest.  Not exertional.  Not alleviated by rest.  She reports it can last several hours.  It is triggered by stress.  Her mother is in assisted living.  Apparently most of the financial as well as social burden follow-up on her.  She does have 2 siblings who do not help out.  She reports she is not exercising as much but has no limitations.  No significant shortness of breath.  No rapid heartbeat sensation.  Blood pressure is well controlled.  She did have gallbladder surgery last year.  Did well with that.  We did discuss her testing from last year.  Everything was very low risk.  We discussed that she has kidney disease and I do not believe coronary CTA would give [place] much more information given her 0 calcium score normal myocardial perfusion imaging.  All of her risk factors are well controlled.  Cholesterol acceptable with LDL 57.  Overall she is low risk.  Problem List 1. CKD 2. Hypothyroidism  3. HLD -T chol 139, HDL 60, LDL 57, TG 126 -CAC score 0   Past Medical History: Past Medical History:  Diagnosis Date   Acid reflux    Apocrine metaplasia of breast    Asthma    BV (bacterial vaginosis)    Depression    h/o   Endometriosis    h/o   HSV-1 (herpes simplex virus 1) infection    Hyperlipidemia    Hypothyroid    Kidney disease    Obese    Seasonal allergies     Past Surgical  History: Past Surgical History:  Procedure Laterality Date   ABDOMINAL HYSTERECTOMY     CESAREAN SECTION     x3   CHOLECYSTECTOMY N/A 04/11/2021   Procedure: LAPAROSCOPIC CHOLECYSTECTOMY;  Surgeon: Rolm Bookbinder, MD;  Location: [place];  Service: General;  Laterality: N/A;   dental work     cap placed 02-02-16   LEG SURGERY     had non cancerous tumors removed from left leg   SINUS ENDO W/FUSION     x3   TUBAL LIGATION     WISDOM TOOTH EXTRACTION      Current Medications: Current Meds  Medication Sig   ascorbic acid (VITAMIN C) 500 MG tablet Take 500 mg by mouth daily.   ASMANEX HFA 100 MCG/ACT AERO Inhale 100 mcg into the lungs as needed.   cyclobenzaprine (FLEXERIL) 10 MG tablet Take 1 tablet by mouth daily as needed.   levothyroxine (SYNTHROID) 75 MCG tablet 1 tablet in the morning on an empty stomach   meclizine (ANTIVERT) 25 MG tablet Take 25 mg by mouth every 6 (six) hours as needed.   Multiple Vitamins-Minerals (MULTIVITAMINS THER. W/MINERALS) TABS Take 1 tablet by mouth daily.   Omega-3 Fatty Acids (  OMEGA 3 PO) Take 1 tablet by mouth daily.   omeprazole (PRILOSEC) 40 MG capsule Take 40 mg by mouth daily.   ondansetron (ZOFRAN ODT) 4 MG disintegrating tablet Take 1 tablet (4 mg total) by mouth every 4 (four) hours as needed for nausea or vomiting.   ondansetron (ZOFRAN) 8 MG tablet Take 8 mg by mouth as needed.   polyvinyl alcohol (LIQUIFILM TEARS) 1.4 % ophthalmic solution Place 1 drop into both eyes daily as needed for dry eyes.   rizatriptan (MAXALT-MLT) 10 MG disintegrating tablet Take 10 mg by mouth daily as needed.   rosuvastatin (CRESTOR) 40 MG tablet Take 40 mg by mouth daily.     Allergies:    Erythromycin, Amoxicillin-pot clavulanate, and Darvocet [propoxyphene n-acetaminophen]   Social History: Social History   Socioeconomic History   Marital status: Married    Spouse name: Not on file   Number of children: 3   Years of education: Not on file   Highest  education level: Not on file  Occupational History   Occupation: Office manager: [employer]  Tobacco Use   Smoking status: Never   Smokeless tobacco: Never  Substance and Sexual Activity   Alcohol use: Yes    Comment: socially   Drug use: No   Sexual activity: Not on file    Comment: btl  Other Topics Concern   Not on file  Social History Narrative   Right handed   Lives husband two story with basement   Social Determinants of Health   Financial Resource Strain: Not on file  Food Insecurity: Not on file  Transportation Needs: Not on file  Physical Activity: Not on file  Stress: Not on file  Social Connections: Not on file     Family History: The patient's family history includes Asthma in her mother; Cancer in her maternal aunt and maternal uncle; Colon cancer in her paternal uncle; Diabetes in her maternal grandmother, paternal grandfather, and paternal grandmother; Emphysema in her maternal uncle; Heart disease in her brother and maternal grandmother; Mitral valve prolapse in her brother and mother; Stroke in an other family member; Thyroid disease in her maternal grandmother and mother. There is no history of Stomach cancer or Rectal cancer.  ROS:   All other ROS reviewed and negative. Pertinent positives noted in the HPI.     EKGs/Labs/Other Studies Reviewed:   The following studies were personally reviewed by me today:  TTE 12/25/2020  1. Left ventricular ejection fraction, by estimation, is 70 to 75%. Left  ventricular ejection fraction by 3D volume is 73 %. The left ventricle has  hyperdynamic function. The left ventricle has no regional wall motion  abnormalities. There is mild  asymmetric left ventricular hypertrophy of the basal-septal segment. Left  ventricular diastolic parameters are consistent with Grade I diastolic  dysfunction (impaired relaxation).   2. Right ventricular systolic function is normal. The right ventricular  size is normal.  There is normal pulmonary artery systolic pressure.   3. Left atrial size was mildly dilated.   4. The mitral valve is normal in structure. Trivial mitral valve  regurgitation. No evidence of mitral stenosis.   5. The aortic valve is tricuspid. Aortic valve regurgitation is not  visualized. No aortic stenosis is present.   6. The inferior vena cava is normal in size with greater than 50%  respiratory variability, suggesting right atrial pressure of 3 mmHg.  NM Stress 12/01/2020 Nuclear stress EF: 76%. Blood pressure demonstrated a normal  response to exercise. There was no ST segment deviation noted during stress. The study is normal. This is a low risk study. The left ventricular ejection fraction is hyperdynamic (>65%).   Recent Labs: 02/24/2021: B Natriuretic Peptide 14.2 04/14/2021: ALT 27; Hemoglobin 9.6; Platelets 173 04/15/2021: BUN 14; Creatinine, Ser 1.30; Potassium 3.8; Sodium 139   Recent Lipid Panel    Component Value Date/Time   CHOL  03/02/2007 0410    197        ATP III CLASSIFICATION:  <200     mg/dL   Desirable  200-239  mg/dL   Borderline High  >=240    mg/dL   High   TRIG 243 (H) 03/02/2007 0410   HDL 41 03/02/2007 0410   CHOLHDL 4.8 03/02/2007 0410   VLDL 49 (H) 03/02/2007 0410   LDLCALC (H) 03/02/2007 0410    107        Total Cholesterol/HDL:CHD Risk Coronary Heart Disease Risk Table                     Men   Women  1/2 Average Risk   3.4   3.3    Physical Exam:   VS:  BP 110/64    Pulse 77    Ht 5\' 6"  (1.676 m)    Wt 198 lb 12.8 oz (90.2 kg)    SpO2 98%    BMI 32.09 kg/m    Wt Readings from Last 3 Encounters:  09/16/21 198 lb 12.8 oz (90.2 kg)  04/11/21 204 lb 11.2 oz (92.9 kg)  02/20/21 211 lb (95.7 kg)    General: Well nourished, well developed, in no acute distress Head: Atraumatic, normal size  Eyes: PEERLA, EOMI  Neck: Supple, no JVD Endocrine: No thryomegaly Cardiac: Normal S1, S2; RRR; no murmurs, rubs, or gallops Lungs: Clear to  auscultation bilaterally, no wheezing, rhonchi or rales  Abd: Soft, nontender, no hepatomegaly  Ext: No edema, pulses 2+ Musculoskeletal: No deformities, BUE and BLE strength normal and equal Skin: Warm and dry, no rashes   Neuro: Alert and oriented to person, place, time, and situation, CNII-XII grossly intact, no focal deficits  Psych: Normal mood and affect   ASSESSMENT:   [name] is a 66 y.o. female who presents for the following: 1. SOB (shortness of breath) on exertion   2. Chest pain of uncertain etiology   3. Mixed hyperlipidemia   4. Obesity (BMI 30-39.9)     PLAN:   1. SOB (shortness of breath) on exertion 2. Chest pain of uncertain etiology -Overall symptoms are consistent with noncardiac chest pain. I believe her symptoms are related to stress and anxiety.  She had a coronary calcium score with a value regularly.  She had normal myocardial perfusion imaging.  She does have CKD so I see no need to put her through coronary CTA.  Did not want to risk any potential damage to her kidneys.  Everything has been reassuring and everything is low risk.  Her echo was normal as well.  I have recommended stress reduction strategies and regular exercise.  I believe she will see [place] back as needed.  3. Mixed hyperlipidemia -LDL 57.  Continue Crestor.  4. Obesity (BMI 30-39.9) -Diet and exercise recommended  Disposition: Return if symptoms worsen or fail to improve.  Medication Adjustments/Labs and Tests Ordered: Current medicines are reviewed at length with the patient today.  Concerns regarding medicines are outlined above.  No orders of the defined types were  placed in this encounter.  No orders of the defined types were placed in this encounter.   Patient Instructions  Medication Instructions:  The current medical regimen is effective;  continue present plan and medications.  *If you need a refill on your cardiac medications before your next appointment, please call  your pharmacy*   Follow-Up: At [facility], you and your health needs are our priority.  As part of our continuing mission to provide you with exceptional heart care, we have created designated Provider Care Teams.  These Care Teams include your primary Cardiologist (physician) and Advanced Practice Providers (APPs -  Physician Assistants and Nurse Practitioners) who all work together to provide you with the care you need, when you need it.  We recommend signing up for the patient portal called "MyChart".  Sign up information is provided on this After Visit Summary.  MyChart is used to connect with patients for Virtual Visits (Telemedicine).  Patients are able to view lab/test results, encounter notes, upcoming appointments, etc.  Non-urgent messages can be sent to your provider as well.   To learn more about what you can do with MyChart, go to [email or web address].    Your next appointment:   As needed  The format for your next appointment:   In Person  Provider:   Eleonore Chiquito, MD      Time Spent with Patient: I have spent a total of 25 minutes with patient reviewing hospital notes, telemetry, EKGs, labs and examining the patient as well as establishing an assessment and plan that was discussed with the patient.  > 50% of time was spent in direct patient care.  Signed, Addison Naegeli. Audie Box, MD, [place]  [address], [place], [place] [address] [phone]  09/16/2021 8:29 AM

## 2021-09-16 ENCOUNTER — Ambulatory Visit: Payer: PPO | Admitting: Cardiovascular Disease

## 2021-09-16 ENCOUNTER — Encounter: Payer: Self-pay | Admitting: Cardiovascular Disease

## 2021-09-16 ENCOUNTER — Other Ambulatory Visit: Payer: Self-pay

## 2021-09-16 VITALS — BP 110/64 | HR 77 | Ht 66.0 in | Wt 198.8 lb

## 2021-09-16 DIAGNOSIS — R0602 Shortness of breath: Secondary | ICD-10-CM

## 2021-09-16 DIAGNOSIS — E669 Obesity, unspecified: Secondary | ICD-10-CM

## 2021-09-16 DIAGNOSIS — E782 Mixed hyperlipidemia: Secondary | ICD-10-CM | POA: Diagnosis not present

## 2021-09-16 DIAGNOSIS — R079 Chest pain, unspecified: Secondary | ICD-10-CM

## 2021-09-16 NOTE — Patient Instructions (Signed)
Medication Instructions:  The current medical regimen is effective;  continue present plan and medications.  *If you need a refill on your cardiac medications before your next appointment, please call your pharmacy*    Follow-Up: At [facility], you and your health needs are our priority.  As part of our continuing mission to provide you with exceptional heart care, we have created designated Provider Care Teams.  These Care Teams include your primary Cardiologist (physician) and Advanced Practice Providers (APPs -  Physician Assistants and Nurse Practitioners) who all work together to provide you with the care you need, when you need it.  We recommend signing up for the patient portal called "MyChart".  Sign up information is provided on this After Visit Summary.  MyChart is used to connect with patients for Virtual Visits (Telemedicine).  Patients are able to view lab/test results, encounter notes, upcoming appointments, etc.  Non-urgent messages can be sent to your provider as well.   To learn more about what you can do with MyChart, go to [email or web address].    Your next appointment:   As needed  The format for your next appointment:   In Person  Provider:   Dyer O'Neal, MD      

## 2021-11-12 DIAGNOSIS — N3001 Acute cystitis with hematuria: Secondary | ICD-10-CM | POA: Diagnosis not present

## 2021-11-14 ENCOUNTER — Emergency Department (HOSPITAL_BASED_OUTPATIENT_CLINIC_OR_DEPARTMENT_OTHER)
Admission: EM | Admit: 2021-11-14 | Discharge: 2021-11-14 | Disposition: A | Discharge status: Home / Self Care | Payer: PPO | Attending: Emergency Medicine | Admitting: Emergency Medicine

## 2021-11-14 ENCOUNTER — Emergency Department (HOSPITAL_BASED_OUTPATIENT_CLINIC_OR_DEPARTMENT_OTHER): Payer: PPO

## 2021-11-14 ENCOUNTER — Other Ambulatory Visit: Payer: Self-pay

## 2021-11-14 ENCOUNTER — Encounter (HOSPITAL_BASED_OUTPATIENT_CLINIC_OR_DEPARTMENT_OTHER): Payer: Self-pay | Admitting: Emergency Medicine

## 2021-11-14 DIAGNOSIS — I700 Atherosclerosis of aorta: Secondary | ICD-10-CM | POA: Diagnosis not present

## 2021-11-14 DIAGNOSIS — N3090 Cystitis, unspecified without hematuria: Secondary | ICD-10-CM | POA: Insufficient documentation

## 2021-11-14 DIAGNOSIS — N179 Acute kidney failure, unspecified: Secondary | ICD-10-CM | POA: Diagnosis not present

## 2021-11-14 DIAGNOSIS — N200 Calculus of kidney: Secondary | ICD-10-CM | POA: Diagnosis not present

## 2021-11-14 DIAGNOSIS — N3289 Other specified disorders of bladder: Secondary | ICD-10-CM | POA: Diagnosis not present

## 2021-11-14 DIAGNOSIS — N390 Urinary tract infection, site not specified: Secondary | ICD-10-CM | POA: Diagnosis not present

## 2021-11-14 DIAGNOSIS — R300 Dysuria: Secondary | ICD-10-CM | POA: Diagnosis present

## 2021-11-14 DIAGNOSIS — R310 Gross hematuria: Secondary | ICD-10-CM | POA: Diagnosis present

## 2021-11-14 DIAGNOSIS — R102 Pelvic and perineal pain: Secondary | ICD-10-CM | POA: Diagnosis present

## 2021-11-14 LAB — CBC WITH DIFFERENTIAL/PLATELET
Abs Immature Granulocytes: 0.02 10*3/uL (ref 0.00–0.07)
Basophils Absolute: 0.1 10*3/uL (ref 0.0–0.1)
Basophils Relative: 1 %
Eosinophils Absolute: 0.1 10*3/uL (ref 0.0–0.5)
Eosinophils Relative: 2 %
HCT: 38.1 % (ref 36.0–46.0)
Hemoglobin: 12.3 g/dL (ref 12.0–15.0)
Immature Granulocytes: 0 %
Lymphocytes Relative: 35 %
Lymphs Abs: 2.6 10*3/uL (ref 0.7–4.0)
MCH: 27.8 pg (ref 26.0–34.0)
MCHC: 32.3 g/dL (ref 30.0–36.0)
MCV: 86.2 fL (ref 80.0–100.0)
Monocytes Absolute: 0.4 10*3/uL (ref 0.1–1.0)
Monocytes Relative: 6 %
Neutro Abs: 4.1 10*3/uL (ref 1.7–7.7)
Neutrophils Relative %: 56 %
Platelets: 209 10*3/uL (ref 150–400)
RBC: 4.42 MIL/uL (ref 3.87–5.11)
RDW: 12.7 % (ref 11.5–15.5)
WBC: 7.3 10*3/uL (ref 4.0–10.5)
nRBC: 0 % (ref 0.0–0.2)

## 2021-11-14 LAB — URINALYSIS, ROUTINE W REFLEX MICROSCOPIC

## 2021-11-14 LAB — URINALYSIS, MICROSCOPIC (REFLEX): Bacteria, UA: NONE SEEN

## 2021-11-14 LAB — BASIC METABOLIC PANEL
Anion gap: 10 (ref 5–15)
BUN: 17 mg/dL (ref 8–23)
CO2: 26 mmol/L (ref 22–32)
Calcium: 9.8 mg/dL (ref 8.9–10.3)
Chloride: 106 mmol/L (ref 98–111)
Creatinine, Ser: 1.32 mg/dL — ABNORMAL HIGH (ref 0.44–1.00)
GFR, Estimated: 45 mL/min — ABNORMAL LOW (ref >60)
Glucose, Bld: 87 mg/dL (ref 70–99)
Potassium: 3.7 mmol/L (ref 3.5–5.1)
Sodium: 142 mmol/L (ref 135–145)

## 2021-11-14 LAB — PROTIME-INR
INR: 1.1 (ref 0.8–1.2)
Prothrombin Time: 13.6 seconds (ref 11.4–15.2)

## 2021-11-14 LAB — APTT: aPTT: 30 seconds (ref 24–36)

## 2021-11-14 LAB — LACTIC ACID, PLASMA: Lactic Acid, Venous: 1 mmol/L (ref 0.5–1.9)

## 2021-11-14 MED ORDER — NITROFURANTOIN MONOHYD MACRO 100 MG PO CAPS
100.0000 mg | ORAL_CAPSULE | Freq: Once | ORAL | Status: DC
Start: 2021-11-14 — End: 2021-11-14

## 2021-11-14 MED ORDER — FENTANYL CITRATE PF 50 MCG/ML IJ SOSY
50.0000 ug | PREFILLED_SYRINGE | Freq: Once | INTRAMUSCULAR | Status: AC
  Administered 2021-11-14: 50 ug via INTRAVENOUS
  Filled 2021-11-14: qty 1

## 2021-11-14 MED ORDER — SODIUM CHLORIDE 0.9 % IV SOLN: INTRAVENOUS | Status: DC | PRN

## 2021-11-14 MED ORDER — IOHEXOL 300 MG/ML  SOLN: 100.0000 mL | Freq: Once | INTRAMUSCULAR | Status: DC | PRN

## 2021-11-14 MED ORDER — TRAMADOL HCL 50 MG PO TABS: 50.0000 mg | ORAL_TABLET | Freq: Three times a day (TID) | ORAL | 0 refills | Status: DC | PRN

## 2021-11-14 MED ORDER — CEFTRIAXONE SODIUM 1 G IJ SOLR
1.0000 g | Freq: Once | INTRAMUSCULAR | Status: AC
  Administered 2021-11-14: 1 g via INTRAVENOUS
  Filled 2021-11-14: qty 10

## 2021-11-14 MED ORDER — LACTATED RINGERS IV BOLUS (SEPSIS)
1000.0000 mL | Freq: Once | INTRAVENOUS | Status: AC
  Administered 2021-11-14: 1000 mL via INTRAVENOUS

## 2021-11-14 MED ORDER — MORPHINE SULFATE (PF) 4 MG/ML IV SOLN
4.0000 mg | Freq: Once | INTRAVENOUS | Status: AC
  Administered 2021-11-14: 4 mg via INTRAVENOUS
  Filled 2021-11-14: qty 1

## 2021-11-14 MED ORDER — NITROFURANTOIN MONOHYD MACRO 100 MG PO CAPS: 100.0000 mg | ORAL_CAPSULE | Freq: Two times a day (BID) | ORAL | 0 refills | Status: AC

## 2021-11-14 MED ORDER — ONDANSETRON HCL 4 MG/2ML IJ SOLN
4.0000 mg | Freq: Once | INTRAMUSCULAR | Status: AC
  Administered 2021-11-14: 4 mg via INTRAVENOUS
  Filled 2021-11-14: qty 2

## 2021-11-14 NOTE — ED Provider Notes (Signed)
?[facility] EMERGENCY DEPT ?Provider Note ? ? ?CSN: [id number] ?Arrival date & time: 11/14/21  1242 ? ?  ? ?History ? ?Chief Complaint  ?Patient presents with  ? Groin Pain  ? ? ?[name] is a 66 y.o. female presented emergency department with suprapubic pain and dysuria.  This been ongoing for about 5 days.  She says she was diagnosed with a UTI and started on Cefdinir or Cephalexin (not sure which one) 2 days ago but she's not getting better.  She is now having frank hematuria, pain in her bilateral lower back, and also shaking chills.  She also reports nausea.  She reports she is having frank hematuria now in her urine.  She is not on blood thinners ? ?She has a history of a cholecystectomy and  hysterectomy ? ?HPI ? ?  ? ?Home Medications ?Prior to Admission medications   ?Medication Sig Start Date End Date Taking? Authorizing Provider  ?nitrofurantoin, macrocrystal-monohydrate, (MACROBID) 100 MG capsule Take 1 capsule (100 mg total) by mouth 2 (two) times daily for 5 days. 11/15/21 11/20/21 Yes Daschel Roughton, Carola Rhine, MD  ?traMADol (ULTRAM) 50 MG tablet Take 1 tablet (50 mg total) by mouth every 8 (eight) hours as needed for up to 10 doses. 11/14/21  Yes Tsuruko Murtha, Carola Rhine, MD  ?ascorbic acid (VITAMIN C) 500 MG tablet Take 500 mg by mouth daily.    [provider]  ?ASMANEX HFA 100 MCG/ACT AERO Inhale 100 mcg into the lungs as needed. 01/01/20   [provider]  ?cyclobenzaprine (FLEXERIL) 10 MG tablet Take 1 tablet by mouth daily as needed.    [provider]  ?levothyroxine (SYNTHROID) 75 MCG tablet 1 tablet in the morning on an empty stomach    [provider]  ?meclizine (ANTIVERT) 25 MG tablet Take 25 mg by mouth every 6 (six) hours as needed. 01/01/20   [provider]  ?Multiple Vitamins-Minerals (MULTIVITAMINS THER. W/MINERALS) TABS Take 1 tablet by mouth daily.    [provider]  ?Omega-3 Fatty Acids (OMEGA 3 PO) Take 1 tablet by mouth  daily.    [provider]  ?omeprazole (PRILOSEC) 40 MG capsule Take 40 mg by mouth daily. 10/24/20   [provider]  ?ondansetron (ZOFRAN ODT) 4 MG disintegrating tablet Take 1 tablet (4 mg total) by mouth every 4 (four) hours as needed for nausea or vomiting. 02/20/21   Charlesetta Shanks, MD  ?ondansetron (ZOFRAN) 8 MG tablet Take 8 mg by mouth as needed.    [provider]  ?polyvinyl alcohol (LIQUIFILM TEARS) 1.4 % ophthalmic solution Place 1 drop into both eyes daily as needed for dry eyes.    [provider]  ?rizatriptan (MAXALT-MLT) 10 MG disintegrating tablet Take 10 mg by mouth daily as needed. 01/01/20   [provider]  ?rosuvastatin (CRESTOR) 40 MG tablet Take 40 mg by mouth daily. 03/07/20   [provider]  ?   ? ?Allergies    ?Erythromycin, Amoxicillin-pot clavulanate, and Darvocet [propoxyphene n-acetaminophen]   ? ?Review of Systems   ?Review of Systems ? ?Physical Exam ?Updated Vital Signs ?BP (!) 122/53   Pulse 60   Temp 98.8 ?F (37.1 ?C) (Oral)   Resp 13   SpO2 97%  ?Physical Exam ?Constitutional:   ?   General: She is not in acute distress. ?HENT:  ?   Head: Normocephalic and atraumatic.  ?Eyes:  ?   Conjunctiva/sclera: Conjunctivae normal.  ?   Pupils: Pupils are equal, round,  and reactive to light.  ?Cardiovascular:  ?   Rate and Rhythm: Normal rate and regular rhythm.  ?Pulmonary:  ?   Effort: Pulmonary effort is normal. No respiratory distress.  ?Abdominal:  ?   General: There is no distension.  ?   Tenderness: There is abdominal tenderness in the suprapubic area.  ?Skin: ?   General: Skin is warm and dry.  ?Neurological:  ?   General: No focal deficit present.  ?   Mental Status: She is alert. Mental status is at baseline.  ?Psychiatric:     ?   Mood and Affect: Mood normal.     ?   Behavior: Behavior normal.  ? ? ?ED Results / Procedures / Treatments   ?Labs ?(all labs ordered are listed, but only abnormal results are displayed) ?Labs  Reviewed  ?URINALYSIS, ROUTINE W REFLEX MICROSCOPIC - Abnormal; Notable for the following components:  ?    Result Value  ? Color, Urine ORANGE (*)   ? APPearance SLIGHT (*)   ? Glucose, UA   (*)   ? Value: TEST NOT REPORTED DUE TO COLOR INTERFERENCE OF URINE PIGMENT  ? Hgb urine dipstick   (*)   ? Value: TEST NOT REPORTED DUE TO COLOR INTERFERENCE OF URINE PIGMENT  ? Bilirubin Urine   (*)   ? Value: TEST NOT REPORTED DUE TO COLOR INTERFERENCE OF URINE PIGMENT  ? Ketones, ur   (*)   ? Value: TEST NOT REPORTED DUE TO COLOR INTERFERENCE OF URINE PIGMENT  ? Protein, ur   (*)   ? Value: TEST NOT REPORTED DUE TO COLOR INTERFERENCE OF URINE PIGMENT  ? Nitrite   (*)   ? Value: TEST NOT REPORTED DUE TO COLOR INTERFERENCE OF URINE PIGMENT  ? Leukocytes,Ua   (*)   ? Value: TEST NOT REPORTED DUE TO COLOR INTERFERENCE OF URINE PIGMENT  ? All other components within normal limits  ?BASIC METABOLIC PANEL - Abnormal; Notable for the following components:  ? Creatinine, Ser 1.32 (*)   ? GFR, Estimated 45 (*)   ? All other components within normal limits  ?URINE CULTURE  ?CULTURE, BLOOD (SINGLE)  ?URINALYSIS, MICROSCOPIC (REFLEX)  ?LACTIC ACID, PLASMA  ?CBC WITH DIFFERENTIAL/PLATELET  ?PROTIME-INR  ?APTT  ? ? ?EKG ?None ? ?Radiology ?CT ABDOMEN PELVIS WO CONTRAST ? ?Result Date: 11/14/2021 ?CLINICAL DATA:  Right sided abd/pel pain x 2 days, cloudy urine x 1.5 weeks -- pt with hx stage 3 renal failure EXAM: CT ABDOMEN AND PELVIS WITHOUT CONTRAST TECHNIQUE: Multidetector CT imaging of the abdomen and pelvis was performed following the standard protocol without IV contrast. RADIATION DOSE REDUCTION: This exam was performed according to the departmental dose-optimization program which includes automated exposure control, adjustment of the mA and/or kV according to patient size and/or use of iterative reconstruction technique. COMPARISON:  07/25/2015 FINDINGS: Lower chest: Clear lung bases. Hepatobiliary: No focal liver abnormality is  seen. Status post cholecystectomy. No biliary dilatation. Pancreas: Unremarkable. No pancreatic ductal dilatation or surrounding inflammatory changes. Spleen: Normal in size without focal abnormality. Adrenals/Urinary Tract: No adrenal masses. Kidneys normal in overall size, orientation and position. No renal mass, stone or hydronephrosis. Normal ureters. Bladder wall mildly thickened with its serosal margin mildly ill-defined. No bladder mass or stone. Stomach/Bowel: Normal stomach. Small bowel and colon are normal in caliber. No wall thickening. No inflammation. Multiple colonic diverticula. Normal appendix visualized. Vascular/Lymphatic: Mild aortic atherosclerosis. No aneurysm. No enlarged lymph nodes. Reproductive: Status post hysterectomy. No adnexal masses. Other: None.  Musculoskeletal: No fracture or acute finding.  No bone lesion. IMPRESSION: 1. Bladder wall mildly thickened consistent with cystitis. No other evidence of an acute abnormality within the abdomen or pelvis. 2. No ureteral stone or obstructive uropathy. No findings on unenhanced CT of pyelonephritis. 3. Mild aortic atherosclerosis. Electronically Signed   By: Lajean Manes M.D.   On: 11/14/2021 18:34   ? ?Procedures ?Procedures  ? ? ?Medications Ordered in ED ?Medications  ?lactated ringers bolus 1,000 mL (0 mLs Intravenous Stopped 11/14/21 1846)  ?morphine (PF) 4 MG/ML injection 4 mg (4 mg Intravenous Given 11/14/21 1715)  ?ondansetron (ZOFRAN) injection 4 mg (4 mg Intravenous Given 11/14/21 1714)  ?cefTRIAXone (ROCEPHIN) 1 g in sodium chloride 0.9 % 100 mL IVPB (0 g Intravenous Stopped 11/14/21 2011)  ?fentaNYL (SUBLIMAZE) injection 50 mcg (50 mcg Intravenous Given 11/14/21 2011)  ? ? ?ED Course/ Medical Decision Making/ A&P ?Clinical Course as of 11/15/21 0935  ?Sat Nov 14, 2021  ?1916 Suspect hemorrhagic cystitis.  Is not clear to me whether she failed cephalosporin outpatient therapy as it has only been 2 days of medication treatment, but I think is  reasonable to give the patient a third-generation cephalosporin with IV Rocephin which has good coverage in this area, and then to be thorough I will switch her to Macrobid for 5 days which also has good E. coli U

## 2021-11-14 NOTE — ED Triage Notes (Signed)
Pt had cloudy urine 1.5 weeks ago, then pelvic pain and bleeding started Thursday and prescribed cefa. Antibiotic. No relief, worsened symptoms now with more pain and larger area (pelvis)ACE inhibitor therapy was not prescribed due to . Pt states her baseline temp is 96.4 and her temp has been 98,6.  ?

## 2021-11-16 LAB — URINE CULTURE: Culture: NO GROWTH

## 2021-11-19 LAB — CULTURE, BLOOD (SINGLE)
Culture: NO GROWTH
Special Requests: ADEQUATE

## 2021-11-27 DIAGNOSIS — N3021 Other chronic cystitis with hematuria: Secondary | ICD-10-CM | POA: Diagnosis not present

## 2021-11-27 DIAGNOSIS — R310 Gross hematuria: Secondary | ICD-10-CM | POA: Diagnosis not present

## 2021-11-27 DIAGNOSIS — R8271 Bacteriuria: Secondary | ICD-10-CM | POA: Diagnosis not present

## 2022-01-15 DIAGNOSIS — J45909 Unspecified asthma, uncomplicated: Secondary | ICD-10-CM | POA: Diagnosis not present

## 2022-01-15 DIAGNOSIS — G43809 Other migraine, not intractable, without status migrainosus: Secondary | ICD-10-CM | POA: Diagnosis not present

## 2022-01-15 DIAGNOSIS — R079 Chest pain, unspecified: Secondary | ICD-10-CM | POA: Diagnosis not present

## 2022-01-15 DIAGNOSIS — G4700 Insomnia, unspecified: Secondary | ICD-10-CM | POA: Diagnosis not present

## 2022-01-15 DIAGNOSIS — Z23 Encounter for immunization: Secondary | ICD-10-CM | POA: Diagnosis not present

## 2022-01-15 DIAGNOSIS — Z0000 Encounter for general adult medical examination without abnormal findings: Secondary | ICD-10-CM | POA: Diagnosis not present

## 2022-01-15 DIAGNOSIS — J301 Allergic rhinitis due to pollen: Secondary | ICD-10-CM | POA: Diagnosis not present

## 2022-01-15 DIAGNOSIS — N1832 Chronic kidney disease, stage 3b: Secondary | ICD-10-CM | POA: Diagnosis not present

## 2022-01-15 DIAGNOSIS — E039 Hypothyroidism, unspecified: Secondary | ICD-10-CM | POA: Diagnosis not present

## 2022-01-15 DIAGNOSIS — E785 Hyperlipidemia, unspecified: Secondary | ICD-10-CM | POA: Diagnosis not present

## 2022-01-15 DIAGNOSIS — F419 Anxiety disorder, unspecified: Secondary | ICD-10-CM | POA: Diagnosis not present

## 2022-01-15 DIAGNOSIS — F324 Major depressive disorder, single episode, in partial remission: Secondary | ICD-10-CM | POA: Diagnosis not present

## 2022-02-02 ENCOUNTER — Encounter (HOSPITAL_BASED_OUTPATIENT_CLINIC_OR_DEPARTMENT_OTHER): Payer: Self-pay | Admitting: Cardiovascular Disease

## 2022-02-08 DIAGNOSIS — M9905 Segmental and somatic dysfunction of pelvic region: Secondary | ICD-10-CM | POA: Diagnosis not present

## 2022-02-08 DIAGNOSIS — M9904 Segmental and somatic dysfunction of sacral region: Secondary | ICD-10-CM | POA: Diagnosis not present

## 2022-02-08 DIAGNOSIS — M5136 Other intervertebral disc degeneration, lumbar region: Secondary | ICD-10-CM | POA: Diagnosis not present

## 2022-02-08 DIAGNOSIS — M9903 Segmental and somatic dysfunction of lumbar region: Secondary | ICD-10-CM | POA: Diagnosis not present

## 2022-02-16 DIAGNOSIS — E039 Hypothyroidism, unspecified: Secondary | ICD-10-CM | POA: Diagnosis not present

## 2022-02-16 DIAGNOSIS — G4719 Other hypersomnia: Secondary | ICD-10-CM | POA: Diagnosis not present

## 2022-02-16 DIAGNOSIS — E785 Hyperlipidemia, unspecified: Secondary | ICD-10-CM | POA: Diagnosis not present

## 2022-03-03 DIAGNOSIS — N3021 Other chronic cystitis with hematuria: Secondary | ICD-10-CM | POA: Diagnosis not present

## 2022-03-03 DIAGNOSIS — R102 Pelvic and perineal pain: Secondary | ICD-10-CM | POA: Diagnosis not present

## 2022-03-08 DIAGNOSIS — G4733 Obstructive sleep apnea (adult) (pediatric): Secondary | ICD-10-CM | POA: Diagnosis not present

## 2022-03-08 DIAGNOSIS — E785 Hyperlipidemia, unspecified: Secondary | ICD-10-CM | POA: Diagnosis not present

## 2022-03-08 DIAGNOSIS — E039 Hypothyroidism, unspecified: Secondary | ICD-10-CM | POA: Diagnosis not present

## 2022-03-10 DIAGNOSIS — F411 Generalized anxiety disorder: Secondary | ICD-10-CM | POA: Diagnosis not present

## 2022-03-10 DIAGNOSIS — G4733 Obstructive sleep apnea (adult) (pediatric): Secondary | ICD-10-CM | POA: Diagnosis not present

## 2022-03-10 DIAGNOSIS — F324 Major depressive disorder, single episode, in partial remission: Secondary | ICD-10-CM | POA: Diagnosis not present

## 2022-04-07 DIAGNOSIS — F419 Anxiety disorder, unspecified: Secondary | ICD-10-CM | POA: Diagnosis not present

## 2022-04-07 DIAGNOSIS — G4733 Obstructive sleep apnea (adult) (pediatric): Secondary | ICD-10-CM | POA: Diagnosis not present

## 2022-04-07 DIAGNOSIS — F32A Depression, unspecified: Secondary | ICD-10-CM | POA: Diagnosis not present

## 2022-04-07 DIAGNOSIS — G4710 Hypersomnia, unspecified: Secondary | ICD-10-CM | POA: Diagnosis not present

## 2022-04-07 DIAGNOSIS — R0683 Snoring: Secondary | ICD-10-CM | POA: Diagnosis not present

## 2022-04-07 DIAGNOSIS — G4700 Insomnia, unspecified: Secondary | ICD-10-CM | POA: Diagnosis not present

## 2022-04-15 DIAGNOSIS — R102 Pelvic and perineal pain: Secondary | ICD-10-CM | POA: Diagnosis not present

## 2022-04-15 DIAGNOSIS — K5732 Diverticulitis of large intestine without perforation or abscess without bleeding: Secondary | ICD-10-CM | POA: Diagnosis not present

## 2022-04-15 DIAGNOSIS — K6389 Other specified diseases of intestine: Secondary | ICD-10-CM | POA: Diagnosis not present

## 2022-04-15 DIAGNOSIS — K5780 Diverticulitis of intestine, part unspecified, with perforation and abscess without bleeding: Secondary | ICD-10-CM | POA: Diagnosis not present

## 2022-04-15 DIAGNOSIS — K439 Ventral hernia without obstruction or gangrene: Secondary | ICD-10-CM | POA: Diagnosis not present

## 2022-04-15 DIAGNOSIS — I700 Atherosclerosis of aorta: Secondary | ICD-10-CM | POA: Diagnosis not present

## 2022-04-19 DIAGNOSIS — K5792 Diverticulitis of intestine, part unspecified, without perforation or abscess without bleeding: Secondary | ICD-10-CM | POA: Diagnosis not present

## 2022-04-20 ENCOUNTER — Encounter (HOSPITAL_COMMUNITY): Payer: Self-pay

## 2022-04-20 ENCOUNTER — Emergency Department (HOSPITAL_COMMUNITY)
Admission: EM | Admit: 2022-04-20 | Discharge: 2022-04-20 | Disposition: A | Discharge status: Home / Self Care | Payer: PPO | Attending: Emergency Medicine | Admitting: Emergency Medicine

## 2022-04-20 ENCOUNTER — Emergency Department (HOSPITAL_COMMUNITY): Payer: PPO

## 2022-04-20 DIAGNOSIS — J45909 Unspecified asthma, uncomplicated: Secondary | ICD-10-CM | POA: Diagnosis not present

## 2022-04-20 DIAGNOSIS — R1030 Lower abdominal pain, unspecified: Secondary | ICD-10-CM | POA: Diagnosis not present

## 2022-04-20 DIAGNOSIS — R197 Diarrhea, unspecified: Secondary | ICD-10-CM | POA: Insufficient documentation

## 2022-04-20 DIAGNOSIS — I700 Atherosclerosis of aorta: Secondary | ICD-10-CM | POA: Diagnosis not present

## 2022-04-20 DIAGNOSIS — R109 Unspecified abdominal pain: Secondary | ICD-10-CM | POA: Diagnosis not present

## 2022-04-20 DIAGNOSIS — R1032 Left lower quadrant pain: Secondary | ICD-10-CM | POA: Diagnosis not present

## 2022-04-20 LAB — COMPREHENSIVE METABOLIC PANEL
ALT: 26 U/L (ref 0–44)
AST: 34 U/L (ref 15–41)
Albumin: 4.3 g/dL (ref 3.5–5.0)
Alkaline Phosphatase: 58 U/L (ref 38–126)
Anion gap: 6 (ref 5–15)
BUN: 20 mg/dL (ref 8–23)
CO2: 23 mmol/L (ref 22–32)
Calcium: 10 mg/dL (ref 8.9–10.3)
Chloride: 111 mmol/L (ref 98–111)
Creatinine, Ser: 1.42 mg/dL — ABNORMAL HIGH (ref 0.44–1.00)
GFR, Estimated: 41 mL/min — ABNORMAL LOW (ref >60)
Glucose, Bld: 98 mg/dL (ref 70–99)
Potassium: 4 mmol/L (ref 3.5–5.1)
Sodium: 140 mmol/L (ref 135–145)
Total Bilirubin: 0.6 mg/dL (ref 0.3–1.2)
Total Protein: 7.8 g/dL (ref 6.5–8.1)

## 2022-04-20 LAB — CBC WITH DIFFERENTIAL/PLATELET
Abs Immature Granulocytes: 0.02 10*3/uL (ref 0.00–0.07)
Basophils Absolute: 0.1 10*3/uL (ref 0.0–0.1)
Basophils Relative: 1 %
Eosinophils Absolute: 0.1 10*3/uL (ref 0.0–0.5)
Eosinophils Relative: 2 %
HCT: 40.6 % (ref 36.0–46.0)
Hemoglobin: 13 g/dL (ref 12.0–15.0)
Immature Granulocytes: 0 %
Lymphocytes Relative: 34 %
Lymphs Abs: 2.4 10*3/uL (ref 0.7–4.0)
MCH: 28.5 pg (ref 26.0–34.0)
MCHC: 32 g/dL (ref 30.0–36.0)
MCV: 89 fL (ref 80.0–100.0)
Monocytes Absolute: 0.5 10*3/uL (ref 0.1–1.0)
Monocytes Relative: 8 %
Neutro Abs: 4 10*3/uL (ref 1.7–7.7)
Neutrophils Relative %: 55 %
Platelets: 225 10*3/uL (ref 150–400)
RBC: 4.56 MIL/uL (ref 3.87–5.11)
RDW: 12.9 % (ref 11.5–15.5)
WBC: 7.1 10*3/uL (ref 4.0–10.5)
nRBC: 0 % (ref 0.0–0.2)

## 2022-04-20 LAB — URINALYSIS, ROUTINE W REFLEX MICROSCOPIC
Bilirubin Urine: NEGATIVE
Glucose, UA: NEGATIVE mg/dL
Hgb urine dipstick: NEGATIVE
Ketones, ur: NEGATIVE mg/dL
Nitrite: NEGATIVE
Protein, ur: 30 mg/dL — AB
Specific Gravity, Urine: 1.027 (ref 1.005–1.030)
pH: 6 (ref 5.0–8.0)

## 2022-04-20 MED ORDER — LACTATED RINGERS IV BOLUS
1000.0000 mL | Freq: Once | INTRAVENOUS | Status: AC
Start: 2022-04-20 — End: 2022-04-20
  Administered 2022-04-20: 1000 mL via INTRAVENOUS

## 2022-04-20 MED ORDER — DICYCLOMINE HCL 10 MG PO CAPS
10.0000 mg | ORAL_CAPSULE | Freq: Once | ORAL | Status: AC
  Administered 2022-04-20: 10 mg via ORAL
  Filled 2022-04-20: qty 1

## 2022-04-20 MED ORDER — DICYCLOMINE HCL 20 MG PO TABS: 20.0000 mg | ORAL_TABLET | Freq: Two times a day (BID) | ORAL | 0 refills | Status: DC

## 2022-04-20 MED ORDER — IOHEXOL 300 MG/ML  SOLN: 80.0000 mL | Freq: Once | INTRAMUSCULAR | Status: DC | PRN

## 2022-04-20 NOTE — ED Triage Notes (Signed)
Pt states that she was recently diagnosed with diverticulitis. Currently on day five of abx course. Pt reports ongoing diarrhea and abd pain.

## 2022-04-20 NOTE — ED Provider Triage Note (Signed)
Emergency Medicine Provider Triage Evaluation Note  [name] , a 66 y.o. female  was evaluated in triage.  Pt complains of ongoing abdominal pain and diarrhea with recent diagnosis of diverticulitis.  Patient states her diarrhea started in the first part of August and has continued to today.  She went to her urologist last week out of concerns for the lower abdominal pain, believing it was either a kidney stone or urinary tract infection.  She states they did a CT and all showed evidence of diverticulitis.  She has been on ciprofloxacin and, however she continues to have worsening abdominal pain and diarrhea.  She denies chest pain, shortness of breath, nausea, vomiting and diarrhea..  Review of Systems  Positive:  Negative:   Physical Exam  BP (!) 122/56   Pulse (!) 52   Temp 97.8 F (36.6 C) (Oral)   Resp 16   SpO2 100%  Gen:   Awake, no distress   Resp:  Normal effort  MSK:   Moves extremities without difficulty  Other:  Abdomen is soft, tender to the left lower quadrant  Medical Decision Making  Medically screening exam initiated at 12:59 PM.  Appropriate orders placed.  [name] was informed that the remainder of the evaluation will be completed by another provider, this initial triage assessment does not replace that evaluation, and the importance of remaining in the ED until their evaluation is complete.     Janell Quiet, [place] 04/20/22 1300

## 2022-04-20 NOTE — ED Provider Notes (Signed)
[facility]PT Provider Note   CSN: [id number] Arrival date & time: 04/20/22  1208     History  Chief Complaint  Patient presents with   Abdominal Pain   Diarrhea    [name] is a 66 y.o. female with history of asthma, hyperlipidemia and recent diagnosis of diverticulitis presents emergency department for ongoing diarrhea and abdominal discomfort.  Patient notes diarrhea started in the first part of August and is continued today.  She went to her urologist last week out of concern for the abdominal pain believing it was either kidney stone or urinary tract infection.  She states that they did a CT scan in office which identified diverticulitis.  She was started on ciprofloxacin and Flagyl and is on day 5 of treatment at this time.  She states the both the abdominal pain and the diarrhea have not improved.  She denies dysuria, hematuria, vomiting, diarrhea, fevers and chills.  She has tried Imodium occasionally without relief.  She called her urologist today and they advised her to come to the emergency department out of suspicion of worsening diverticulitis and possible need of IV antibiotics.   Abdominal Pain Associated symptoms: diarrhea   Associated symptoms: no fever, no nausea, no shortness of breath and no vomiting   Diarrhea Associated symptoms: abdominal pain   Associated symptoms: no fever and no vomiting    Abdominal Pain Associated symptoms: diarrhea   Associated symptoms: no fever and no shortness of breath   Diarrhea Associated symptoms: abdominal pain   Associated symptoms: no fever    Abdominal Pain Associated symptoms: diarrhea   Associated symptoms: no fever   Diarrhea Associated symptoms: abdominal pain   Associated symptoms: no fever        Home Medications Prior to Admission medications   Medication Sig Start Date End Date Taking? Authorizing Provider  dicyclomine (BENTYL) 20 MG tablet Take 1 tablet (20 mg total)  by mouth 2 (two) times daily. 04/20/22  Yes Janell Quiet, PA-C  ascorbic acid (VITAMIN C) 500 MG tablet Take 500 mg by mouth daily.    [provider]  [facility] HFA 100 MCG/ACT AERO Inhale 100 mcg into the lungs as needed. 01/01/20   [provider]  cyclobenzaprine (FLEXERIL) 10 MG tablet Take 1 tablet by mouth daily as needed.    [provider]  levothyroxine (SYNTHROID) 75 MCG tablet 1 tablet in the morning on an empty stomach    [provider]  meclizine (ANTIVERT) 25 MG tablet Take 25 mg by mouth every 6 (six) hours as needed. 01/01/20   [provider]  Multiple Vitamins-Minerals (MULTIVITAMINS THER. W/MINERALS) TABS Take 1 tablet by mouth daily.    [provider]  Omega-3 Fatty Acids (OMEGA 3 PO) Take 1 tablet by mouth daily.    [provider]  omeprazole (PRILOSEC) 40 MG capsule Take 40 mg by mouth daily. 10/24/20   [provider]  ondansetron (ZOFRAN ODT) 4 MG disintegrating tablet Take 1 tablet (4 mg total) by mouth every 4 (four) hours as needed for nausea or vomiting. 02/20/21   Arby Barrette, MD  ondansetron (ZOFRAN) 8 MG tablet Take 8 mg by mouth as needed.    [provider]  polyvinyl alcohol (LIQUIFILM TEARS) 1.4 % ophthalmic solution Place 1 drop into both eyes daily as needed for dry eyes.    [provider]  rizatriptan (MAXALT-MLT) 10 MG disintegrating tablet Take 10 mg by mouth daily as needed. 01/01/20  [provider]  rosuvastatin (CRESTOR) 40 MG tablet Take 40 mg by mouth daily. 03/07/20   [provider]  traMADol (ULTRAM) 50 MG tablet Take 1 tablet (50 mg total) by mouth every 8 (eight) hours as needed for up to 10 doses. 11/14/21   Terald Sleeper, MD      Allergies    Erythromycin, Amoxicillin-pot clavulanate, and Darvocet [propoxyphene n-acetaminophen]    Review of Systems   Review of Systems  Constitutional:  Negative for fever.  Respiratory:  Negative  for shortness of breath.   Gastrointestinal:  Positive for abdominal pain and diarrhea. Negative for nausea and vomiting.    Physical Exam Updated Vital Signs BP 138/66   Pulse (!) 59   Temp 98.7 F (37.1 C) (Oral)   Resp 14   SpO2 100%  Physical Exam Vitals and nursing note reviewed.  Constitutional:      General: She is not in acute distress.    Appearance: She is not ill-appearing.  HENT:     Head: Atraumatic.  Eyes:     Conjunctiva/sclera: Conjunctivae normal.  Cardiovascular:     Rate and Rhythm: Normal rate and regular rhythm.     Pulses: Normal pulses.     Heart sounds: No murmur heard. Pulmonary:     Effort: Pulmonary effort is normal. No respiratory distress.     Breath sounds: Normal breath sounds.  Abdominal:     General: Abdomen is flat. There is no distension.     Palpations: Abdomen is soft.     Tenderness: There is abdominal tenderness. There is no right CVA tenderness, left CVA tenderness, guarding or rebound. Negative signs include Murphy's sign and McBurney's sign.     Comments: Tenderness across the lower abdomen, worse in suprapubic region  Musculoskeletal:        General: Normal range of motion.     Cervical back: Normal range of motion.  Skin:    General: Skin is warm and dry.     Capillary Refill: Capillary refill takes less than 2 seconds.  Neurological:     General: No focal deficit present.     Mental Status: She is alert.  Psychiatric:        Mood and Affect: Mood normal.     ED Results / Procedures / Treatments   Labs (all labs ordered are listed, but only abnormal results are displayed) Labs Reviewed  COMPREHENSIVE METABOLIC PANEL - Abnormal; Notable for the following components:      Result Value   Creatinine, Ser 1.42 (*)    GFR, Estimated 41 (*)    All other components within normal limits  URINALYSIS, ROUTINE W REFLEX MICROSCOPIC - Abnormal; Notable for the following components:   Color, Urine AMBER (*)    APPearance HAZY (*)     Protein, ur 30 (*)    Leukocytes,Ua SMALL (*)    Bacteria, UA RARE (*)    All other components within normal limits  CBC WITH DIFFERENTIAL/PLATELET    EKG None  Radiology CT ABDOMEN PELVIS WO CONTRAST  Result Date: 04/20/2022 CLINICAL DATA:  Left lower quadrant abdominal pain. EXAM: CT ABDOMEN AND PELVIS WITHOUT CONTRAST TECHNIQUE: Multidetector CT imaging of the abdomen and pelvis was performed following the standard protocol without IV contrast. RADIATION DOSE REDUCTION: This exam was performed according to the departmental dose-optimization program which includes automated exposure control, adjustment of the mA and/or kV according to patient size and/or use of iterative reconstruction technique. COMPARISON:  11/14/2021 FINDINGS: Lower  chest: No acute abnormality. Hepatobiliary: No focal liver abnormality is seen. Status post cholecystectomy. No biliary dilatation. Pancreas: Unremarkable. No pancreatic ductal dilatation or surrounding inflammatory changes. Spleen: Normal in size without focal abnormality. Adrenals/Urinary Tract: Normal adrenal glands. No kidney mass, hydronephrosis, or nephrolithiasis. Urinary bladder is unremarkable. Stomach/Bowel: Stomach appears within normal limits. The appendix is visualized and appears normal. Liquid stool identified within the colon.No bowel wall thickening, inflammation, or distension identified. Sigmoid diverticulosis without signs of acute diverticulitis. Vascular/Lymphatic: Aortic atherosclerosis. No aneurysm. No abdominopelvic adenopathy identified. Reproductive: Status post hysterectomy. No adnexal masses. Other: No free fluid or fluid collections. Musculoskeletal: No acute or significant osseous findings. Degenerative disc disease is identified at L1-2 and L4-5. 5 mm anterolisthesis of L5 on S1 noted. Bilateral facet arthropathy noted within the lower lumbar spine. IMPRESSION: 1. No acute findings within the abdomen or pelvis. 2. Sigmoid  diverticulosis without signs of acute diverticulitis. 3. Aortic Atherosclerosis (ICD10-I70.0). 4. Lumbar spondylosis. Electronically Signed   By: Signa Kell M.D.   On: 04/20/2022 14:32    Procedures Procedures    Medications Ordered in ED Medications  lactated ringers bolus 1,000 mL (0 mLs Intravenous Stopped 04/20/22 2133)  dicyclomine (BENTYL) capsule 10 mg (10 mg Oral Given 04/20/22 2031)    ED Course/ Medical Decision Making/ A&P Clinical Course as of 04/20/22 2203  Tue Apr 20, 2022  1415 CT ABDOMEN PELVIS W CONTRAST [DR]    Clinical Course User Index [DR] Margarita Grizzle, MD                           Medical Decision Making Amount and/or Complexity of Data Reviewed Labs: ordered. Radiology: ordered.  Risk Prescription drug management.   Social determinants of health:  Social History   Socioeconomic History   Marital status: Married    Spouse name: Not on file   Number of children: 3   Years of education: Not on file   Highest education level: Not on file  Occupational History   Occupation: Scientist, physiological: [employer]  Tobacco Use   Smoking status: Never   Smokeless tobacco: Never  Substance and Sexual Activity   Alcohol use: Yes    Comment: socially   Drug use: No   Sexual activity: Not on file    Comment: btl  Other Topics Concern   Not on file  Social History Narrative   Right handed   Lives husband two story with basement   Social Determinants of Health   Financial Resource Strain: Not on file  Food Insecurity: Not on file  Transportation Needs: Not on file  Physical Activity: Not on file  Stress: Not on file  Social Connections: Not on file  Intimate Partner Violence: Not on file     Initial impression:  This patient presents to the ED for concern of lower abdominal pain and diarrhea, this involves an extensive number of treatment options, and is a complaint that carries with it a high risk of complications and morbidity.    Differentials include complicated diverticulitis, simple diverticulitis, infectious diarrhea, appendicitis, gastroenteritis.   Comorbidities affecting care:  Per HPI  Additional history obtained: None  Lab Tests  I Ordered, reviewed, and interpreted labs and EKG.  The pertinent results include:  CMP with mild AKI 1.42, similar to previous CBC without leukocytosis Urinalysis without evidence of infection, appears contaminated  Imaging Studies ordered:  I ordered imaging studies including  CT abdomen pelvis without  acute findings, no evidence of diverticulitis I independently visualized and interpreted imaging and I agree with the radiologist interpretation.   Medicines ordered and prescription drug management:  I ordered medication including: 1 L LR bolus Bentyl 10 mg Reevaluation of the patient after these medicines showed that the patient stayed the same I have reviewed the patients home medicines and have made adjustments as needed   ED Course/Re-evaluation: Presents in no acute distress and is nontoxic.  Vitals are without significant abnormality.  On exam, she has tenderness across the suprapubic region into the periumbilical.  No guarding or rebound.  She does mention having a CT done at her urologist the other day, however this is not visible in her chart or on care everywhere.  Labs today were reassuring without leukocytosis or urinary tract infection.  CT scan also normal.  Patient was given a liter of fluids with some Bentyl without significant improvement in pain.  She tried multiple times to produce stool sample here in the emergency department to send out for GI testing, however she was unable to produce a sample.  It is possible that she has an infectious cause of her diarrhea.   She is already currently on antibiotics and advised her to continue as prescribed.  She was sent home with stool sample cup in case she is able to produce a sample.  She can call her PCP in the  morning to have this test ordered if needed.  Ultimately, there is no clinical indication for IV antibiotics or further treatment here in the emergency department.  Disposition:  After consideration of the diagnostic results, physical exam, history and the patients response to treatment feel that the patent would benefit from discharge.   Lower abdominal pain Diarrhea: Plan and management as described above. Discharged home in good condition.  Final Clinical Impression(s) / ED Diagnoses Final diagnoses:  Diarrhea, unspecified type  Lower abdominal pain    Rx / DC Orders ED Discharge Orders          Ordered    dicyclomine (BENTYL) 20 MG tablet  2 times daily        04/20/22 2138              Delight Ovens 04/20/22 2214    Gloris Manchester, MD 04/21/22 [phone]

## 2022-04-20 NOTE — ED Notes (Signed)
Pt states feeling dizzy and in a lot of pain as vitals are been updated

## 2022-04-20 NOTE — Discharge Instructions (Signed)
Your work-up today was overall very reassuring.  Your CT scan was normal without evidence of diverticulitis or major findings within the abdomen.  Your urine did not show signs of infection although it was contaminated.  I have given you some fluids along with Bentyl here in the emergency department.  I will send you home with some of this medication to see if this helps with your abdominal cramping.  Ultimately, I do think that we need to test your stool for infectious diarrhea.  You can go home with the hat and a sample cup and if you are able to produce a sample, call your PCP to order GI testing.

## 2022-04-22 DIAGNOSIS — R197 Diarrhea, unspecified: Secondary | ICD-10-CM | POA: Diagnosis not present

## 2022-04-30 DIAGNOSIS — R1084 Generalized abdominal pain: Secondary | ICD-10-CM | POA: Diagnosis not present

## 2022-04-30 DIAGNOSIS — R197 Diarrhea, unspecified: Secondary | ICD-10-CM | POA: Diagnosis not present

## 2022-04-30 DIAGNOSIS — E039 Hypothyroidism, unspecified: Secondary | ICD-10-CM | POA: Diagnosis not present

## 2022-05-04 DIAGNOSIS — Z90711 Acquired absence of uterus with remaining cervical stump: Secondary | ICD-10-CM | POA: Diagnosis not present

## 2022-05-04 DIAGNOSIS — Z1231 Encounter for screening mammogram for malignant neoplasm of breast: Secondary | ICD-10-CM | POA: Diagnosis not present

## 2022-05-04 DIAGNOSIS — N898 Other specified noninflammatory disorders of vagina: Secondary | ICD-10-CM | POA: Diagnosis not present

## 2022-05-04 DIAGNOSIS — Z6833 Body mass index (BMI) 33.0-33.9, adult: Secondary | ICD-10-CM | POA: Diagnosis not present

## 2022-05-04 DIAGNOSIS — Z1211 Encounter for screening for malignant neoplasm of colon: Secondary | ICD-10-CM | POA: Diagnosis not present

## 2022-05-04 DIAGNOSIS — M8580 Other specified disorders of bone density and structure, unspecified site: Secondary | ICD-10-CM | POA: Diagnosis not present

## 2022-05-05 ENCOUNTER — Other Ambulatory Visit: Payer: Self-pay | Admitting: Obstetrics and Gynecology

## 2022-05-05 DIAGNOSIS — M8580 Other specified disorders of bone density and structure, unspecified site: Secondary | ICD-10-CM

## 2022-05-08 DIAGNOSIS — R0683 Snoring: Secondary | ICD-10-CM | POA: Diagnosis not present

## 2022-05-08 DIAGNOSIS — G4700 Insomnia, unspecified: Secondary | ICD-10-CM | POA: Diagnosis not present

## 2022-05-08 DIAGNOSIS — G4733 Obstructive sleep apnea (adult) (pediatric): Secondary | ICD-10-CM | POA: Diagnosis not present

## 2022-05-08 DIAGNOSIS — F419 Anxiety disorder, unspecified: Secondary | ICD-10-CM | POA: Diagnosis not present

## 2022-05-08 DIAGNOSIS — G4710 Hypersomnia, unspecified: Secondary | ICD-10-CM | POA: Diagnosis not present

## 2022-05-08 DIAGNOSIS — F32A Depression, unspecified: Secondary | ICD-10-CM | POA: Diagnosis not present

## 2022-05-28 DIAGNOSIS — F32A Depression, unspecified: Secondary | ICD-10-CM | POA: Diagnosis not present

## 2022-05-28 DIAGNOSIS — G4700 Insomnia, unspecified: Secondary | ICD-10-CM | POA: Diagnosis not present

## 2022-05-28 DIAGNOSIS — G4733 Obstructive sleep apnea (adult) (pediatric): Secondary | ICD-10-CM | POA: Diagnosis not present

## 2022-05-28 DIAGNOSIS — F419 Anxiety disorder, unspecified: Secondary | ICD-10-CM | POA: Diagnosis not present

## 2022-05-28 DIAGNOSIS — G4710 Hypersomnia, unspecified: Secondary | ICD-10-CM | POA: Diagnosis not present

## 2022-05-28 DIAGNOSIS — R0683 Snoring: Secondary | ICD-10-CM | POA: Diagnosis not present

## 2022-05-31 ENCOUNTER — Encounter: Payer: Self-pay | Admitting: Gastroenterology

## 2022-06-07 DIAGNOSIS — G4710 Hypersomnia, unspecified: Secondary | ICD-10-CM | POA: Diagnosis not present

## 2022-06-07 DIAGNOSIS — R0683 Snoring: Secondary | ICD-10-CM | POA: Diagnosis not present

## 2022-06-07 DIAGNOSIS — G4700 Insomnia, unspecified: Secondary | ICD-10-CM | POA: Diagnosis not present

## 2022-06-07 DIAGNOSIS — E785 Hyperlipidemia, unspecified: Secondary | ICD-10-CM | POA: Diagnosis not present

## 2022-06-07 DIAGNOSIS — F419 Anxiety disorder, unspecified: Secondary | ICD-10-CM | POA: Diagnosis not present

## 2022-06-07 DIAGNOSIS — E039 Hypothyroidism, unspecified: Secondary | ICD-10-CM | POA: Diagnosis not present

## 2022-06-07 DIAGNOSIS — F32A Depression, unspecified: Secondary | ICD-10-CM | POA: Diagnosis not present

## 2022-06-07 DIAGNOSIS — G4733 Obstructive sleep apnea (adult) (pediatric): Secondary | ICD-10-CM | POA: Diagnosis not present

## 2022-07-06 ENCOUNTER — Ambulatory Visit: Payer: PPO | Admitting: Gastroenterology

## 2022-07-06 ENCOUNTER — Encounter: Payer: Self-pay | Admitting: Gastroenterology

## 2022-07-06 ENCOUNTER — Other Ambulatory Visit: Payer: PPO

## 2022-07-06 VITALS — BP 119/64 | HR 57 | Ht 66.0 in | Wt 207.0 lb

## 2022-07-06 DIAGNOSIS — R197 Diarrhea, unspecified: Secondary | ICD-10-CM | POA: Insufficient documentation

## 2022-07-06 NOTE — Progress Notes (Signed)
07/06/2022 [name] [id number] [birth year]   HISTORY OF PRESENT ILLNESS:  This is a 66 year old female who is a patient of Dr. Ardell Isaacs.  She tells me that at the beginning of August she started having uncontrollable diarrhea.  This lasted for about 6 weeks.  She was also having horrible abdominal pain and bloating.  She had a CT scan at urology they told her that they thought she had diverticulitis so was treated with Cipro and Flagyl for 7 to 10 days.  Symptoms continued so she went to the emergency department on 04/20/2022 and had a CT scan of the abdomen pelvis without contrast that did not show diverticulitis or any other source of her pain.  She followed up with her PCP after that and had a stool study positive for enteropathogenic E. coli and adenovirus.  She was treated for the E. coli.  All of this occurred over 2 months ago and symptoms are about 75% better, but she still is having mushy/unformed stools and a couple of days per week with uncontrollable diarrhea.  Pain and bloating are better, but not gone.   CT scan of the abdomen and pelvis without contrast 04/2022:  IMPRESSION: 1. No acute findings within the abdomen or pelvis. 2. Sigmoid diverticulosis without signs of acute diverticulitis. 3. Aortic Atherosclerosis (ICD10-I70.0). 4. Lumbar spondylosis.  Last colonoscopy June 2017 showed diverticulosis, but otherwise was recommended for recall in 10 years.  Past Medical History:  Diagnosis Date   Acid reflux    Apocrine metaplasia of breast    Asthma    BV (bacterial vaginosis)    Depression    h/o   Endometriosis    h/o   HSV-1 (herpes simplex virus 1) infection    Hyperlipidemia    Hypothyroid    Kidney disease    Obese    Seasonal allergies    Past Surgical History:  Procedure Laterality Date   ABDOMINAL HYSTERECTOMY     CESAREAN SECTION     x3   CHOLECYSTECTOMY N/A 04/11/2021   Procedure: LAPAROSCOPIC CHOLECYSTECTOMY;  Surgeon: [name], MD;   Location: [facility] OR;  Service: General;  Laterality: N/A;   dental work     cap placed 02-02-16   LEG SURGERY     had non cancerous tumors removed from left leg   SINUS ENDO W/FUSION     x3   TUBAL LIGATION     WISDOM TOOTH EXTRACTION      reports that she has never smoked. She has never used smokeless tobacco. She reports current alcohol use. She reports that she does not use drugs. family history includes Asthma in her mother; Cancer in her maternal aunt and maternal uncle; Colon cancer in her paternal uncle; Diabetes in her maternal grandmother, paternal grandfather, and paternal grandmother; Emphysema in her maternal uncle; Heart disease in her brother and maternal grandmother; Mitral valve prolapse in her brother and mother; Stroke in an other family member; Thyroid disease in her maternal grandmother and mother. Allergies  Allergen Reactions   Erythromycin     Abd pain   Amoxicillin-Pot Clavulanate Nausea And Vomiting   Darvocet [Propoxyphene N-Acetaminophen] Nausea And Vomiting      Outpatient Encounter Medications as of 07/06/2022  Medication Sig   ascorbic acid (VITAMIN C) 500 MG tablet Take 500 mg by mouth daily.   ASMANEX HFA 100 MCG/ACT AERO Inhale 100 mcg into the lungs as needed.   cyclobenzaprine (FLEXERIL) 10 MG tablet Take 1 tablet by mouth  daily as needed.   dicyclomine (BENTYL) 20 MG tablet Take 1 tablet (20 mg total) by mouth 2 (two) times daily.   escitalopram (LEXAPRO) 10 MG tablet Take 10 mg by mouth daily.   levothyroxine (SYNTHROID) 75 MCG tablet 1 tablet in the morning on an empty stomach   meclizine (ANTIVERT) 25 MG tablet Take 25 mg by mouth every 6 (six) hours as needed.   Multiple Vitamins-Minerals (MULTIVITAMINS THER. W/MINERALS) TABS Take 1 tablet by mouth daily.   Omega-3 Fatty Acids (OMEGA 3 PO) Take 1 tablet by mouth daily.   ondansetron (ZOFRAN ODT) 4 MG disintegrating tablet Take 1 tablet (4 mg total) by mouth every 4 (four) hours as needed for nausea  or vomiting.   ondansetron (ZOFRAN) 8 MG tablet Take 8 mg by mouth as needed.   polyvinyl alcohol (LIQUIFILM TEARS) 1.4 % ophthalmic solution Place 1 drop into both eyes daily as needed for dry eyes.   rizatriptan (MAXALT-MLT) 10 MG disintegrating tablet Take 10 mg by mouth daily as needed.   rosuvastatin (CRESTOR) 40 MG tablet Take 40 mg by mouth daily.   traZODone (DESYREL) 50 MG tablet Take 50 mg by mouth at bedtime.   omeprazole (PRILOSEC) 40 MG capsule Take 40 mg by mouth daily. (Patient not taking: Reported on 07/06/2022)   traMADol (ULTRAM) 50 MG tablet Take 1 tablet (50 mg total) by mouth every 8 (eight) hours as needed for up to 10 doses. (Patient not taking: Reported on 07/06/2022)   No facility-administered encounter medications on file as of 07/06/2022.     REVIEW OF SYSTEMS  : All other systems reviewed and negative except where noted in the History of Present Illness.   PHYSICAL EXAM: BP 119/64   Pulse (!) 57   Ht 5\' 6"  (1.676 m)   Wt 207 lb (93.9 kg)   SpO2 97%   BMI 33.41 kg/m  General: Well developed white female in no acute distress Head: Normocephalic and atraumatic Eyes:  Sclerae anicteric, conjunctiva pink. Ears: Normal auditory acuity Lungs: Clear throughout to auscultation; no W/R/R. Heart: Regular rate and rhythm; no M/R/G. Abdomen: Soft, non-distended.  BS present.  Mild lower abdominal TTP. Musculoskeletal: Symmetrical with no gross deformities  Skin: No lesions on visible extremities Extremities: No edema  Neurological: Alert oriented x 4, grossly non-focal. Psychological:  Alert and cooperative. Normal mood and affect  ASSESSMENT AND PLAN: *66 year old female with complaints of several weeks of diarrhea, abdominal pain, and bloating who then had a stool study positive for enteropathogenic E. coli and adenovirus.  This is after being treated with antibiotics for empiric diverticulitis.  She was treated for the E. coli.  All of this occurred over 2  months ago and symptoms are about 75% better, but she still is having mushy/unformed stools and a couple of days per week with uncontrollable diarrhea.  Pain and bloating are better, but not gone.  At this point she may have more of a postinfectious IBS type picture.  I am going to repeat a stool GI pathogen panel and we will also check a fecal calprotectin and a fecal lactoferrin.  Will give her some more time and I recommended in her situation maybe she try Florastor probiotic for the next 4 to 6 weeks to see if that helps along with time.  If symptoms persist may need to consider colonoscopy.   CC:  71, MD

## 2022-07-06 NOTE — Patient Instructions (Signed)
Your provider has requested that you go to the basement level for lab work before leaving today. Press "B" on the elevator. The lab is located at the first door on the left as you exit the elevator.   Start Florastor Probiotic -Take 1 pill by mouth twice daily for 4-6 weeks. Florastor can be purchased over the counter.   _______________________________________________________  If you are age 66 or older, your body mass index should be between 23-30. Your Body mass index is 33.41 kg/m. If this is out of the aforementioned range listed, please consider follow up with your Primary Care Provider.  If you are age 21 or younger, your body mass index should be between 19-25. Your Body mass index is 33.41 kg/m. If this is out of the aformentioned range listed, please consider follow up with your Primary Care Provider.   ________________________________________________________  The [facility] providers would like to encourage you to use [facility] to communicate with providers for non-urgent requests or questions.  Due to long hold times on the telephone, sending your provider a message by [facility] may be a faster and more efficient way to get a response.  Please allow 48 business hours for a response.  Please remember that this is for non-urgent requests.  _______________________________________________________  Due to recent changes in healthcare laws, you may see the results of your imaging and laboratory studies on MyChart before your provider has had a chance to review them.  We understand that in some cases there may be results that are confusing or concerning to you. Not all laboratory results come back in the same time frame and the provider may be waiting for multiple results in order to interpret others.  Please give [place] 48 hours in order for your provider to thoroughly review all the results before contacting the office for clarification of your results.   Thank you for choosing me and [facility].  Doug Sou PA-C

## 2022-07-13 DIAGNOSIS — G4710 Hypersomnia, unspecified: Secondary | ICD-10-CM | POA: Diagnosis not present

## 2022-07-13 DIAGNOSIS — G4700 Insomnia, unspecified: Secondary | ICD-10-CM | POA: Diagnosis not present

## 2022-07-13 DIAGNOSIS — G4733 Obstructive sleep apnea (adult) (pediatric): Secondary | ICD-10-CM | POA: Diagnosis not present

## 2022-07-14 ENCOUNTER — Other Ambulatory Visit: Payer: PPO

## 2022-07-14 DIAGNOSIS — R197 Diarrhea, unspecified: Secondary | ICD-10-CM

## 2022-07-15 LAB — FECAL LACTOFERRIN, QUANT
Fecal Lactoferrin: NEGATIVE
MICRO NUMBER:: 14278669
SPECIMEN QUALITY:: ADEQUATE

## 2022-07-18 ENCOUNTER — Telehealth: Payer: Self-pay | Admitting: Gastroenterology

## 2022-07-18 LAB — GI PROFILE, STOOL, PCR

## 2022-07-18 LAB — CALPROTECTIN, FECAL: Calprotectin, Fecal: 8 ug/g (ref 0–120)

## 2022-07-18 MED ORDER — VANCOMYCIN HCL 125 MG PO CAPS
125.0000 mg | ORAL_CAPSULE | Freq: Four times a day (QID) | ORAL | 0 refills | Status: AC
Start: 2022-07-18 — End: 2022-08-01

## 2022-07-18 NOTE — Telephone Encounter (Signed)
Spoke with patient and gave her Cdiff results.  Vancomycin sent to pharmacy.

## 2022-07-23 DIAGNOSIS — H52223 Regular astigmatism, bilateral: Secondary | ICD-10-CM | POA: Diagnosis not present

## 2022-07-23 DIAGNOSIS — H04203 Unspecified epiphora, bilateral lacrimal glands: Secondary | ICD-10-CM | POA: Diagnosis not present

## 2022-07-23 DIAGNOSIS — H524 Presbyopia: Secondary | ICD-10-CM | POA: Diagnosis not present

## 2022-07-23 DIAGNOSIS — H5203 Hypermetropia, bilateral: Secondary | ICD-10-CM | POA: Diagnosis not present

## 2022-07-23 DIAGNOSIS — G245 Blepharospasm: Secondary | ICD-10-CM | POA: Diagnosis not present

## 2022-08-05 DIAGNOSIS — G4710 Hypersomnia, unspecified: Secondary | ICD-10-CM | POA: Diagnosis not present

## 2022-08-05 DIAGNOSIS — G4733 Obstructive sleep apnea (adult) (pediatric): Secondary | ICD-10-CM | POA: Diagnosis not present

## 2022-08-05 DIAGNOSIS — G4700 Insomnia, unspecified: Secondary | ICD-10-CM | POA: Diagnosis not present

## 2022-09-13 DIAGNOSIS — G4733 Obstructive sleep apnea (adult) (pediatric): Secondary | ICD-10-CM | POA: Diagnosis not present

## 2022-09-13 DIAGNOSIS — E785 Hyperlipidemia, unspecified: Secondary | ICD-10-CM | POA: Diagnosis not present

## 2022-09-13 DIAGNOSIS — E039 Hypothyroidism, unspecified: Secondary | ICD-10-CM | POA: Diagnosis not present

## 2022-09-22 DIAGNOSIS — F419 Anxiety disorder, unspecified: Secondary | ICD-10-CM | POA: Diagnosis not present

## 2022-09-22 DIAGNOSIS — R0789 Other chest pain: Secondary | ICD-10-CM | POA: Diagnosis not present

## 2022-09-22 DIAGNOSIS — R030 Elevated blood-pressure reading, without diagnosis of hypertension: Secondary | ICD-10-CM | POA: Diagnosis not present

## 2022-09-27 DIAGNOSIS — G4733 Obstructive sleep apnea (adult) (pediatric): Secondary | ICD-10-CM | POA: Diagnosis not present

## 2022-09-30 DIAGNOSIS — G245 Blepharospasm: Secondary | ICD-10-CM | POA: Diagnosis not present

## 2022-09-30 DIAGNOSIS — H04203 Unspecified epiphora, bilateral lacrimal glands: Secondary | ICD-10-CM | POA: Diagnosis not present

## 2022-10-14 DIAGNOSIS — G245 Blepharospasm: Secondary | ICD-10-CM | POA: Diagnosis not present

## 2022-10-18 ENCOUNTER — Other Ambulatory Visit: Payer: PPO

## 2022-10-18 DIAGNOSIS — G245 Blepharospasm: Secondary | ICD-10-CM | POA: Diagnosis not present

## 2022-10-19 DIAGNOSIS — G245 Blepharospasm: Secondary | ICD-10-CM | POA: Diagnosis not present

## 2022-12-06 DIAGNOSIS — G4733 Obstructive sleep apnea (adult) (pediatric): Secondary | ICD-10-CM | POA: Diagnosis not present

## 2022-12-06 DIAGNOSIS — G4700 Insomnia, unspecified: Secondary | ICD-10-CM | POA: Diagnosis not present

## 2022-12-06 DIAGNOSIS — J209 Acute bronchitis, unspecified: Secondary | ICD-10-CM | POA: Diagnosis not present

## 2022-12-06 DIAGNOSIS — G4710 Hypersomnia, unspecified: Secondary | ICD-10-CM | POA: Diagnosis not present

## 2022-12-07 DIAGNOSIS — R0683 Snoring: Secondary | ICD-10-CM | POA: Diagnosis not present

## 2022-12-07 DIAGNOSIS — F32A Depression, unspecified: Secondary | ICD-10-CM | POA: Diagnosis not present

## 2022-12-07 DIAGNOSIS — G4700 Insomnia, unspecified: Secondary | ICD-10-CM | POA: Diagnosis not present

## 2022-12-07 DIAGNOSIS — G4733 Obstructive sleep apnea (adult) (pediatric): Secondary | ICD-10-CM | POA: Diagnosis not present

## 2022-12-07 DIAGNOSIS — F419 Anxiety disorder, unspecified: Secondary | ICD-10-CM | POA: Diagnosis not present

## 2022-12-07 DIAGNOSIS — G4710 Hypersomnia, unspecified: Secondary | ICD-10-CM | POA: Diagnosis not present

## 2023-01-04 DIAGNOSIS — G4710 Hypersomnia, unspecified: Secondary | ICD-10-CM | POA: Diagnosis not present

## 2023-01-04 DIAGNOSIS — G4733 Obstructive sleep apnea (adult) (pediatric): Secondary | ICD-10-CM | POA: Diagnosis not present

## 2023-01-04 DIAGNOSIS — G4700 Insomnia, unspecified: Secondary | ICD-10-CM | POA: Diagnosis not present

## 2023-01-06 DIAGNOSIS — G4710 Hypersomnia, unspecified: Secondary | ICD-10-CM | POA: Diagnosis not present

## 2023-01-06 DIAGNOSIS — G245 Blepharospasm: Secondary | ICD-10-CM | POA: Diagnosis not present

## 2023-01-06 DIAGNOSIS — F32A Depression, unspecified: Secondary | ICD-10-CM | POA: Diagnosis not present

## 2023-01-06 DIAGNOSIS — F419 Anxiety disorder, unspecified: Secondary | ICD-10-CM | POA: Diagnosis not present

## 2023-01-06 DIAGNOSIS — G4700 Insomnia, unspecified: Secondary | ICD-10-CM | POA: Diagnosis not present

## 2023-01-06 DIAGNOSIS — R0683 Snoring: Secondary | ICD-10-CM | POA: Diagnosis not present

## 2023-01-06 DIAGNOSIS — G4733 Obstructive sleep apnea (adult) (pediatric): Secondary | ICD-10-CM | POA: Diagnosis not present

## 2023-02-04 ENCOUNTER — Ambulatory Visit
Admission: RE | Admit: 2023-02-04 | Discharge: 2023-02-04 | Disposition: A | Discharge status: Home / Self Care | Payer: PPO | Source: Ambulatory Visit | Attending: Obstetrics and Gynecology | Admitting: Obstetrics and Gynecology

## 2023-02-04 DIAGNOSIS — N958 Other specified menopausal and perimenopausal disorders: Secondary | ICD-10-CM | POA: Diagnosis not present

## 2023-02-04 DIAGNOSIS — M8580 Other specified disorders of bone density and structure, unspecified site: Secondary | ICD-10-CM

## 2023-02-06 DIAGNOSIS — F419 Anxiety disorder, unspecified: Secondary | ICD-10-CM | POA: Diagnosis not present

## 2023-02-06 DIAGNOSIS — G4733 Obstructive sleep apnea (adult) (pediatric): Secondary | ICD-10-CM | POA: Diagnosis not present

## 2023-02-06 DIAGNOSIS — R0683 Snoring: Secondary | ICD-10-CM | POA: Diagnosis not present

## 2023-02-06 DIAGNOSIS — G4710 Hypersomnia, unspecified: Secondary | ICD-10-CM | POA: Diagnosis not present

## 2023-02-06 DIAGNOSIS — G4700 Insomnia, unspecified: Secondary | ICD-10-CM | POA: Diagnosis not present

## 2023-02-06 DIAGNOSIS — F32A Depression, unspecified: Secondary | ICD-10-CM | POA: Diagnosis not present

## 2023-02-14 DIAGNOSIS — G5603 Carpal tunnel syndrome, bilateral upper limbs: Secondary | ICD-10-CM | POA: Diagnosis not present

## 2023-02-23 DIAGNOSIS — R051 Acute cough: Secondary | ICD-10-CM | POA: Diagnosis not present

## 2023-02-23 DIAGNOSIS — R0981 Nasal congestion: Secondary | ICD-10-CM | POA: Diagnosis not present

## 2023-02-23 DIAGNOSIS — R509 Fever, unspecified: Secondary | ICD-10-CM | POA: Diagnosis not present

## 2023-02-23 DIAGNOSIS — U071 COVID-19: Secondary | ICD-10-CM | POA: Diagnosis not present

## 2023-03-08 DIAGNOSIS — F32A Depression, unspecified: Secondary | ICD-10-CM | POA: Diagnosis not present

## 2023-03-08 DIAGNOSIS — R0683 Snoring: Secondary | ICD-10-CM | POA: Diagnosis not present

## 2023-03-08 DIAGNOSIS — G4700 Insomnia, unspecified: Secondary | ICD-10-CM | POA: Diagnosis not present

## 2023-03-08 DIAGNOSIS — F419 Anxiety disorder, unspecified: Secondary | ICD-10-CM | POA: Diagnosis not present

## 2023-03-08 DIAGNOSIS — G4733 Obstructive sleep apnea (adult) (pediatric): Secondary | ICD-10-CM | POA: Diagnosis not present

## 2023-03-08 DIAGNOSIS — G4710 Hypersomnia, unspecified: Secondary | ICD-10-CM | POA: Diagnosis not present

## 2023-03-14 DIAGNOSIS — G5603 Carpal tunnel syndrome, bilateral upper limbs: Secondary | ICD-10-CM | POA: Diagnosis not present

## 2023-03-21 DIAGNOSIS — G5603 Carpal tunnel syndrome, bilateral upper limbs: Secondary | ICD-10-CM | POA: Diagnosis not present

## 2023-03-30 DIAGNOSIS — M79642 Pain in left hand: Secondary | ICD-10-CM | POA: Diagnosis not present

## 2023-03-30 DIAGNOSIS — M79641 Pain in right hand: Secondary | ICD-10-CM | POA: Diagnosis not present

## 2023-04-01 DIAGNOSIS — K219 Gastro-esophageal reflux disease without esophagitis: Secondary | ICD-10-CM | POA: Diagnosis not present

## 2023-04-01 DIAGNOSIS — F330 Major depressive disorder, recurrent, mild: Secondary | ICD-10-CM | POA: Diagnosis not present

## 2023-04-01 DIAGNOSIS — N1832 Chronic kidney disease, stage 3b: Secondary | ICD-10-CM | POA: Diagnosis not present

## 2023-04-01 DIAGNOSIS — F419 Anxiety disorder, unspecified: Secondary | ICD-10-CM | POA: Diagnosis not present

## 2023-04-01 DIAGNOSIS — I700 Atherosclerosis of aorta: Secondary | ICD-10-CM | POA: Diagnosis not present

## 2023-04-01 DIAGNOSIS — M1990 Unspecified osteoarthritis, unspecified site: Secondary | ICD-10-CM | POA: Diagnosis not present

## 2023-04-01 DIAGNOSIS — E785 Hyperlipidemia, unspecified: Secondary | ICD-10-CM | POA: Diagnosis not present

## 2023-04-01 DIAGNOSIS — G4733 Obstructive sleep apnea (adult) (pediatric): Secondary | ICD-10-CM | POA: Diagnosis not present

## 2023-04-01 DIAGNOSIS — E039 Hypothyroidism, unspecified: Secondary | ICD-10-CM | POA: Diagnosis not present

## 2023-04-01 DIAGNOSIS — E669 Obesity, unspecified: Secondary | ICD-10-CM | POA: Diagnosis not present

## 2023-04-01 DIAGNOSIS — I129 Hypertensive chronic kidney disease with stage 1 through stage 4 chronic kidney disease, or unspecified chronic kidney disease: Secondary | ICD-10-CM | POA: Diagnosis not present

## 2023-04-01 DIAGNOSIS — H01009 Unspecified blepharitis unspecified eye, unspecified eyelid: Secondary | ICD-10-CM | POA: Diagnosis not present

## 2023-04-04 DIAGNOSIS — G4733 Obstructive sleep apnea (adult) (pediatric): Secondary | ICD-10-CM | POA: Diagnosis not present

## 2023-04-04 DIAGNOSIS — G4700 Insomnia, unspecified: Secondary | ICD-10-CM | POA: Diagnosis not present

## 2023-04-04 DIAGNOSIS — G4710 Hypersomnia, unspecified: Secondary | ICD-10-CM | POA: Diagnosis not present

## 2023-04-08 DIAGNOSIS — G4710 Hypersomnia, unspecified: Secondary | ICD-10-CM | POA: Diagnosis not present

## 2023-04-08 DIAGNOSIS — F32A Depression, unspecified: Secondary | ICD-10-CM | POA: Diagnosis not present

## 2023-04-08 DIAGNOSIS — R0683 Snoring: Secondary | ICD-10-CM | POA: Diagnosis not present

## 2023-04-08 DIAGNOSIS — G4700 Insomnia, unspecified: Secondary | ICD-10-CM | POA: Diagnosis not present

## 2023-04-08 DIAGNOSIS — F419 Anxiety disorder, unspecified: Secondary | ICD-10-CM | POA: Diagnosis not present

## 2023-04-08 DIAGNOSIS — G4733 Obstructive sleep apnea (adult) (pediatric): Secondary | ICD-10-CM | POA: Diagnosis not present

## 2023-04-15 DIAGNOSIS — G5603 Carpal tunnel syndrome, bilateral upper limbs: Secondary | ICD-10-CM | POA: Diagnosis not present

## 2023-05-04 DIAGNOSIS — Z1331 Encounter for screening for depression: Secondary | ICD-10-CM | POA: Diagnosis not present

## 2023-05-04 DIAGNOSIS — N1831 Chronic kidney disease, stage 3a: Secondary | ICD-10-CM | POA: Diagnosis not present

## 2023-05-04 DIAGNOSIS — Z0000 Encounter for general adult medical examination without abnormal findings: Secondary | ICD-10-CM | POA: Diagnosis not present

## 2023-05-04 DIAGNOSIS — K219 Gastro-esophageal reflux disease without esophagitis: Secondary | ICD-10-CM | POA: Diagnosis not present

## 2023-05-04 DIAGNOSIS — G4710 Hypersomnia, unspecified: Secondary | ICD-10-CM | POA: Diagnosis not present

## 2023-05-04 DIAGNOSIS — F419 Anxiety disorder, unspecified: Secondary | ICD-10-CM | POA: Diagnosis not present

## 2023-05-04 DIAGNOSIS — F324 Major depressive disorder, single episode, in partial remission: Secondary | ICD-10-CM | POA: Diagnosis not present

## 2023-05-04 DIAGNOSIS — G4700 Insomnia, unspecified: Secondary | ICD-10-CM | POA: Diagnosis not present

## 2023-05-04 DIAGNOSIS — I700 Atherosclerosis of aorta: Secondary | ICD-10-CM | POA: Diagnosis not present

## 2023-05-04 DIAGNOSIS — E039 Hypothyroidism, unspecified: Secondary | ICD-10-CM | POA: Diagnosis not present

## 2023-05-04 DIAGNOSIS — G4733 Obstructive sleep apnea (adult) (pediatric): Secondary | ICD-10-CM | POA: Diagnosis not present

## 2023-05-04 DIAGNOSIS — E785 Hyperlipidemia, unspecified: Secondary | ICD-10-CM | POA: Diagnosis not present

## 2023-05-09 DIAGNOSIS — Z1231 Encounter for screening mammogram for malignant neoplasm of breast: Secondary | ICD-10-CM | POA: Diagnosis not present

## 2023-05-09 DIAGNOSIS — Z1331 Encounter for screening for depression: Secondary | ICD-10-CM | POA: Diagnosis not present

## 2023-05-09 DIAGNOSIS — N760 Acute vaginitis: Secondary | ICD-10-CM | POA: Diagnosis not present

## 2023-05-09 DIAGNOSIS — Z1211 Encounter for screening for malignant neoplasm of colon: Secondary | ICD-10-CM | POA: Diagnosis not present

## 2023-05-09 DIAGNOSIS — R0683 Snoring: Secondary | ICD-10-CM | POA: Diagnosis not present

## 2023-05-09 DIAGNOSIS — G4733 Obstructive sleep apnea (adult) (pediatric): Secondary | ICD-10-CM | POA: Diagnosis not present

## 2023-05-09 DIAGNOSIS — Z90711 Acquired absence of uterus with remaining cervical stump: Secondary | ICD-10-CM | POA: Diagnosis not present

## 2023-05-09 DIAGNOSIS — F32A Depression, unspecified: Secondary | ICD-10-CM | POA: Diagnosis not present

## 2023-05-09 DIAGNOSIS — G4700 Insomnia, unspecified: Secondary | ICD-10-CM | POA: Diagnosis not present

## 2023-05-09 DIAGNOSIS — M8580 Other specified disorders of bone density and structure, unspecified site: Secondary | ICD-10-CM | POA: Diagnosis not present

## 2023-05-09 DIAGNOSIS — G4710 Hypersomnia, unspecified: Secondary | ICD-10-CM | POA: Diagnosis not present

## 2023-05-09 DIAGNOSIS — Z139 Encounter for screening, unspecified: Secondary | ICD-10-CM | POA: Diagnosis not present

## 2023-05-09 DIAGNOSIS — Z01419 Encounter for gynecological examination (general) (routine) without abnormal findings: Secondary | ICD-10-CM | POA: Diagnosis not present

## 2023-05-09 DIAGNOSIS — F419 Anxiety disorder, unspecified: Secondary | ICD-10-CM | POA: Diagnosis not present

## 2023-05-12 DIAGNOSIS — G5602 Carpal tunnel syndrome, left upper limb: Secondary | ICD-10-CM | POA: Diagnosis not present

## 2023-05-26 DIAGNOSIS — E669 Obesity, unspecified: Secondary | ICD-10-CM | POA: Diagnosis not present

## 2023-05-26 DIAGNOSIS — E785 Hyperlipidemia, unspecified: Secondary | ICD-10-CM | POA: Diagnosis not present

## 2023-05-26 DIAGNOSIS — E039 Hypothyroidism, unspecified: Secondary | ICD-10-CM | POA: Diagnosis not present

## 2023-05-26 DIAGNOSIS — G4733 Obstructive sleep apnea (adult) (pediatric): Secondary | ICD-10-CM | POA: Diagnosis not present

## 2023-06-08 DIAGNOSIS — G4710 Hypersomnia, unspecified: Secondary | ICD-10-CM | POA: Diagnosis not present

## 2023-06-08 DIAGNOSIS — F32A Depression, unspecified: Secondary | ICD-10-CM | POA: Diagnosis not present

## 2023-06-08 DIAGNOSIS — G4733 Obstructive sleep apnea (adult) (pediatric): Secondary | ICD-10-CM | POA: Diagnosis not present

## 2023-06-08 DIAGNOSIS — F419 Anxiety disorder, unspecified: Secondary | ICD-10-CM | POA: Diagnosis not present

## 2023-06-08 DIAGNOSIS — G4700 Insomnia, unspecified: Secondary | ICD-10-CM | POA: Diagnosis not present

## 2023-06-08 DIAGNOSIS — R0683 Snoring: Secondary | ICD-10-CM | POA: Diagnosis not present

## 2023-06-30 ENCOUNTER — Observation Stay (HOSPITAL_COMMUNITY)
Admission: EM | Admit: 2023-06-30 | Discharge: 2023-07-02 | Disposition: A | Discharge status: Home / Self Care | Payer: PPO | Attending: Internal Medicine | Admitting: Internal Medicine

## 2023-06-30 ENCOUNTER — Other Ambulatory Visit: Payer: Self-pay

## 2023-06-30 ENCOUNTER — Emergency Department (HOSPITAL_COMMUNITY): Payer: PPO

## 2023-06-30 ENCOUNTER — Encounter (HOSPITAL_COMMUNITY): Payer: Self-pay

## 2023-06-30 DIAGNOSIS — R079 Chest pain, unspecified: Secondary | ICD-10-CM | POA: Diagnosis not present

## 2023-06-30 DIAGNOSIS — E669 Obesity, unspecified: Secondary | ICD-10-CM | POA: Diagnosis not present

## 2023-06-30 DIAGNOSIS — R0789 Other chest pain: Secondary | ICD-10-CM | POA: Diagnosis not present

## 2023-06-30 DIAGNOSIS — G43909 Migraine, unspecified, not intractable, without status migrainosus: Secondary | ICD-10-CM | POA: Diagnosis present

## 2023-06-30 DIAGNOSIS — E039 Hypothyroidism, unspecified: Secondary | ICD-10-CM | POA: Insufficient documentation

## 2023-06-30 DIAGNOSIS — K219 Gastro-esophageal reflux disease without esophagitis: Secondary | ICD-10-CM | POA: Diagnosis not present

## 2023-06-30 DIAGNOSIS — I700 Atherosclerosis of aorta: Secondary | ICD-10-CM | POA: Diagnosis not present

## 2023-06-30 DIAGNOSIS — Z79899 Other long term (current) drug therapy: Secondary | ICD-10-CM | POA: Diagnosis not present

## 2023-06-30 DIAGNOSIS — R0602 Shortness of breath: Secondary | ICD-10-CM | POA: Diagnosis not present

## 2023-06-30 DIAGNOSIS — I2510 Atherosclerotic heart disease of native coronary artery without angina pectoris: Secondary | ICD-10-CM | POA: Insufficient documentation

## 2023-06-30 DIAGNOSIS — Z6835 Body mass index (BMI) 35.0-35.9, adult: Secondary | ICD-10-CM | POA: Diagnosis not present

## 2023-06-30 DIAGNOSIS — E785 Hyperlipidemia, unspecified: Secondary | ICD-10-CM | POA: Diagnosis present

## 2023-06-30 DIAGNOSIS — J45909 Unspecified asthma, uncomplicated: Secondary | ICD-10-CM | POA: Diagnosis present

## 2023-06-30 DIAGNOSIS — N1831 Chronic kidney disease, stage 3a: Secondary | ICD-10-CM | POA: Diagnosis not present

## 2023-06-30 DIAGNOSIS — N189 Chronic kidney disease, unspecified: Secondary | ICD-10-CM | POA: Diagnosis not present

## 2023-06-30 DIAGNOSIS — R42 Dizziness and giddiness: Secondary | ICD-10-CM | POA: Insufficient documentation

## 2023-06-30 LAB — CBC
HCT: 41.6 % (ref 36.0–46.0)
Hemoglobin: 13.5 g/dL (ref 12.0–15.0)
MCH: 28.9 pg (ref 26.0–34.0)
MCHC: 32.5 g/dL (ref 30.0–36.0)
MCV: 89.1 fL (ref 80.0–100.0)
Platelets: 231 10*3/uL (ref 150–400)
RBC: 4.67 MIL/uL (ref 3.87–5.11)
RDW: 12.5 % (ref 11.5–15.5)
WBC: 7.4 10*3/uL (ref 4.0–10.5)
nRBC: 0 % (ref 0.0–0.2)

## 2023-06-30 LAB — TROPONIN I (HIGH SENSITIVITY)
Troponin I (High Sensitivity): 4 ng/L (ref <18)
Troponin I (High Sensitivity): 6 ng/L (ref <18)

## 2023-06-30 LAB — BASIC METABOLIC PANEL
Anion gap: 8 (ref 5–15)
BUN: 19 mg/dL (ref 8–23)
CO2: 22 mmol/L (ref 22–32)
Calcium: 9.8 mg/dL (ref 8.9–10.3)
Chloride: 109 mmol/L (ref 98–111)
Creatinine, Ser: 1.27 mg/dL — ABNORMAL HIGH (ref 0.44–1.00)
GFR, Estimated: 46 mL/min — ABNORMAL LOW (ref >60)
Glucose, Bld: 157 mg/dL — ABNORMAL HIGH (ref 70–99)
Potassium: 3.8 mmol/L (ref 3.5–5.1)
Sodium: 139 mmol/L (ref 135–145)

## 2023-06-30 LAB — D-DIMER, QUANTITATIVE: D-Dimer, Quant: <0.27 ug{FEU}/mL (ref 0.00–0.50)

## 2023-06-30 MED ORDER — ACETAMINOPHEN 325 MG PO TABS
650.0000 mg | ORAL_TABLET | ORAL | Status: DC | PRN
Start: 2023-06-30 — End: 2023-07-02
  Administered 2023-07-01 (×2): 650 mg via ORAL
  Filled 2023-06-30 (×2): qty 2

## 2023-06-30 MED ORDER — ONDANSETRON HCL 4 MG/2ML IJ SOLN: 4.0000 mg | Freq: Four times a day (QID) | INTRAMUSCULAR | Status: DC | PRN

## 2023-06-30 MED ORDER — NITROGLYCERIN 0.4 MG SL SUBL
0.4000 mg | SUBLINGUAL_TABLET | SUBLINGUAL | Status: DC | PRN
  Administered 2023-06-30: 0.4 mg via SUBLINGUAL
  Filled 2023-06-30: qty 1

## 2023-06-30 MED ORDER — ENOXAPARIN SODIUM 40 MG/0.4ML IJ SOSY
40.0000 mg | PREFILLED_SYRINGE | INTRAMUSCULAR | Status: DC
  Administered 2023-07-01 – 2023-07-02 (×2): 40 mg via SUBCUTANEOUS
  Filled 2023-06-30 (×2): qty 0.4

## 2023-06-30 MED ORDER — ACETAMINOPHEN 500 MG PO TABS
1000.0000 mg | ORAL_TABLET | Freq: Once | ORAL | Status: AC
  Administered 2023-06-30: 1000 mg via ORAL
  Filled 2023-06-30: qty 2

## 2023-06-30 NOTE — Assessment & Plan Note (Signed)
 Cont synthroid

## 2023-06-30 NOTE — H&P (Signed)
History and Physical    Patient: [name] NWG:[id number] DOB: 26-Mar-1956 DOA: 06/30/2023 DOS: the patient was seen and examined on 06/30/2023 PCP: Elias Else, MD (Inactive)  Patient coming from: Home  Chief Complaint:  Chief Complaint  Patient presents with   Dizziness   HPI: [name] is a 67 y.o. female with medical history significant of hypothyroidism, HLD, obesity.  Pt in to ED with c/o CP.  CP radiates to L arm, onset yesterday.  Associated with SOB, dizziness.  Also nausea x4 days, no vomiting.  Intermittent SOB when CP hits.  Resting improves symptoms.  Went to see PCP today who sent to ED. No h/o DVT/PE. In October 2024 she had carpal tunnel surgery. Also endorses some bilateral leg swelling. No unilateral leg swelling.  Does use estrogen vaginal cream intermittently. Had seen Dr. Flora Lipps with [facility] on 09/16/2021, had NM stress test normal in 2021 and echo w/ normal LV function 2021 as well. Patient also reports increased fatigue for several months, sleeping 10-12 hours at night and taking 3-4 hours naps during the day. Denies numbness/tingling, asymmetric weakness, visual changes. Endorses a mild headache, headache worse after NTG.  CP improved with NTG.  EDP d/w Dr. Daisey Must, per EDP: "I talked with Dr. Jacques Navy who felt like she could go or be admitted. She had studies done in 2021 that were normal. Her chest pain this time per the documentation seems to be different now in that it's relieved by rest. It was also relieved by the nitro. It does sometimes occur while at rest as well. To me it seemed like an obs felt more appropriate than a DC."   Review of Systems: As mentioned in the history of present illness. All other systems reviewed and are negative. Past Medical History:  Diagnosis Date   Acid reflux    Apocrine metaplasia of breast    Asthma    BV (bacterial vaginosis)    Depression    h/o   Endometriosis    h/o   HSV-1 (herpes simplex virus 1)  infection    Hyperlipidemia    Hypothyroid    Kidney disease    Obese    Seasonal allergies    Past Surgical History:  Procedure Laterality Date   ABDOMINAL HYSTERECTOMY     CESAREAN SECTION     x3   CHOLECYSTECTOMY N/A 04/11/2021   Procedure: LAPAROSCOPIC CHOLECYSTECTOMY;  Surgeon: [name], MD;  Location: [facility] OR;  Service: General;  Laterality: N/A;   dental work     cap placed 02-02-16   LEG SURGERY     had non cancerous tumors removed from left leg   SINUS ENDO W/FUSION     x3   TUBAL LIGATION     WISDOM TOOTH EXTRACTION     Social History:  reports that she has never smoked. She has never used smokeless tobacco. She reports current alcohol use. She reports that she does not use drugs.  Allergies  Allergen Reactions   Erythromycin     Abd pain   Amoxicillin-Pot Clavulanate Nausea And Vomiting   Darvocet [Propoxyphene N-Acetaminophen] Nausea And Vomiting   Nortriptyline Hcl Other (See Comments)    Weight gain   Wellbutrin [Bupropion] Other (See Comments)    sweating    Family History  Problem Relation Age of Onset   Cancer Maternal Uncle    Emphysema Maternal Uncle    Stroke Other    Asthma Mother    Mitral valve prolapse Mother  Thyroid disease Mother    Heart disease Brother    Mitral valve prolapse Brother    Cancer Maternal Aunt        uterine and liver and kidney   Diabetes Maternal Grandmother    Heart disease Maternal Grandmother    Thyroid disease Maternal Grandmother    Diabetes Paternal Grandmother    Diabetes Paternal Grandfather    Colon cancer Paternal Uncle    Stomach cancer Neg Hx    Rectal cancer Neg Hx     Prior to Admission medications   Medication Sig Start Date End Date Taking? Authorizing Provider  albuterol (VENTOLIN HFA) 108 (90 Base) MCG/ACT inhaler Inhale 1-2 puffs into the lungs every 6 (six) hours as needed (for bronchospasm).   Yes [provider]  ascorbic acid (VITAMIN C) 500 MG tablet Take 500 mg by mouth  daily.   Yes [provider]  escitalopram (LEXAPRO) 10 MG tablet Take 10 mg by mouth daily.   Yes [provider]  levothyroxine (SYNTHROID) 75 MCG tablet 1 tablet in the morning on an empty stomach   Yes [provider]  Multiple Vitamins-Minerals (MULTIVITAMINS THER. W/MINERALS) TABS Take 1 tablet by mouth daily.   Yes [provider]  polyvinyl alcohol (LIQUIFILM TEARS) 1.4 % ophthalmic solution Place 1 drop into both eyes daily as needed for dry eyes.   Yes [provider]  rosuvastatin (CRESTOR) 40 MG tablet Take 40 mg by mouth daily. 03/07/20  Yes [provider]    Physical Exam: Vitals:   06/30/23 1845 06/30/23 2000 06/30/23 2030 06/30/23 2045  BP: 134/72 134/65 133/66 120/84  Pulse: 65 74 66 66  Resp:      Temp:      TempSrc:      SpO2: 96% 96% 98% 97%  Weight:      Height:       Constitutional: NAD, calm, comfortable Respiratory: clear to auscultation bilaterally, no wheezing, no crackles. Normal respiratory effort. No accessory muscle use.  Cardiovascular: Regular rate and rhythm, no murmurs / rubs / gallops. No extremity edema. 2+ pedal pulses. No carotid bruits.  Abdomen: no tenderness, no masses palpated. No hepatosplenomegaly. Bowel sounds positive.  Neurologic: CN 2-12 grossly intact. Sensation intact, DTR normal. Strength 5/5 in all 4.  Psychiatric: Normal judgment and insight. Alert and oriented x 3. Normal mood.   Data Reviewed:     Labs on Admission: I have personally reviewed following labs and imaging studies  CBC: Recent Labs  Lab 06/30/23 1304  WBC 7.4  HGB 13.5  HCT 41.6  MCV 89.1  PLT 231   Basic Metabolic Panel: Recent Labs  Lab 06/30/23 1304  NA 139  K 3.8  CL 109  CO2 22  GLUCOSE 157*  BUN 19  CREATININE 1.27*  CALCIUM 9.8   GFR: Estimated Creatinine Clearance: 49.7 mL/min (A) (by C-G formula based on SCr of 1.27 mg/dL (H)). Liver Function Tests: No results for input(s):  "AST", "ALT", "ALKPHOS", "BILITOT", "PROT", "ALBUMIN" in the last 168 hours. No results for input(s): "LIPASE", "AMYLASE" in the last 168 hours. No results for input(s): "AMMONIA" in the last 168 hours. Coagulation Profile: No results for input(s): "INR", "PROTIME" in the last 168 hours. Cardiac Enzymes: No results for input(s): "CKTOTAL", "CKMB", "CKMBINDEX", "TROPONINI" in the last 168 hours. BNP (last 3 results) No results for input(s): "PROBNP" in the last 8760 hours. HbA1C: No results for input(s): "HGBA1C" in the last 72 hours. CBG: No results for input(s): "  GLUCAP" in the last 168 hours. Lipid Profile: No results for input(s): "CHOL", "HDL", "LDLCALC", "TRIG", "CHOLHDL", "LDLDIRECT" in the last 72 hours. Thyroid Function Tests: No results for input(s): "TSH", "T4TOTAL", "FREET4", "T3FREE", "THYROIDAB" in the last 72 hours. Anemia Panel: No results for input(s): "VITAMINB12", "FOLATE", "FERRITIN", "TIBC", "IRON", "RETICCTPCT" in the last 72 hours. Urine analysis:    Component Value Date/Time   COLORURINE AMBER (A) 04/20/2022 1312   APPEARANCEUR HAZY (A) 04/20/2022 1312   LABSPEC 1.027 04/20/2022 1312   PHURINE 6.0 04/20/2022 1312   GLUCOSEU NEGATIVE 04/20/2022 1312   HGBUR NEGATIVE 04/20/2022 1312   BILIRUBINUR NEGATIVE 04/20/2022 1312   BILIRUBINUR neg 10/03/2012 1544   KETONESUR NEGATIVE 04/20/2022 1312   PROTEINUR 30 (A) 04/20/2022 1312   UROBILINOGEN negative 10/03/2012 1544   UROBILINOGEN 0.2 07/10/2011 2310   NITRITE NEGATIVE 04/20/2022 1312   LEUKOCYTESUR SMALL (A) 04/20/2022 1312    Radiological Exams on Admission: DG Chest 2 View  Result Date: 06/30/2023 CLINICAL DATA:  Chest pain.  Chest pressure.  Left arm pain. EXAM: CHEST - 2 VIEW COMPARISON:  04/14/2021. FINDINGS: Bilateral lung fields are clear. Elevated right hemidiaphragm noted. Bilateral costophrenic angles are clear. Normal cardio-mediastinal silhouette. No acute osseous abnormalities. The soft  tissues are within normal limits. IMPRESSION: *No active cardiopulmonary disease. Electronically Signed   By: Jules Schick M.D.   On: 06/30/2023 14:48    EKG: Independently reviewed.   Assessment and Plan: * Chest pain, rule out acute myocardial infarction Trop neg, d.dimer neg Improved after NTG EDP d/w Dr. Daisey Must as per HPI above, requests obs for CP. CP obs pathway Tele monitor NPO after MN Message sent to P. Trent for cards eval in AM  Migraine Pt with headache following NTG. Pt with h/o migraines in past. Can't use triptan in CP r/o pt PRN tylenol Add other meds if needed  Hypothyroidism Cont synthroid      Advance Care Planning:   Code Status: Full Code  Consults: Message sent to P. Johna Sheriff for AM consult  Family Communication: No family in room  Severity of Illness: The appropriate patient status for this patient is OBSERVATION. Observation status is judged to be reasonable and necessary in order to provide the required intensity of service to ensure the patient's safety. The patient's presenting symptoms, physical exam findings, and initial radiographic and laboratory data in the context of their medical condition is felt to place them at decreased risk for further clinical deterioration. Furthermore, it is anticipated that the patient will be medically stable for discharge from the hospital within 2 midnights of admission.   Author: Hillary Bow., DO 06/30/2023 9:15 PM  For on call review [email or web address].

## 2023-06-30 NOTE — Assessment & Plan Note (Signed)
Pt with headache following NTG. Pt with h/o migraines in past. Can't use triptan in CP r/o pt PRN tylenol Add other meds if needed

## 2023-06-30 NOTE — ED Notes (Signed)
PT is c/o of feeling like her head is about to explode.

## 2023-06-30 NOTE — ED Provider Notes (Signed)
[place] EMERGENCY DEPARTMENT AT [facility] Provider Note   CSN: [id number] Arrival date & time: 06/30/23  1255     History {Add pertinent medical, surgical, social history, OB history to HPI:1} Chief Complaint  Patient presents with  . Dizziness    [name] is a 67 y.o. female with PMH as listed below who presents with several symptoms, including chest pain radiating to left arm yesterday, shortness of breath, and dizziness. Dizziness happens especially when she gets up from sitting or laying down. A/w nausea x 4 days with no vomiting. Stayed in bed most of day yesterday trying not to move. Also had "heaviness in chest and down the arm," which started yesterday. Can last 15-20 seconds and then eases off for a little while. Feels SOB intermittently when the pain hits. Resting makes the pain better, lying down also makes it better. Pain comes on when she moves around but also at rest coming and going. Went to see PCP today who sent to ED. No h/o DVT/PE. In October 2024 she had carpal tunnel surgery. Also endorses some bilateral leg swelling. No unilateral leg swelling. Does use estrogen vaginal cream intermittently. Had seen Dr. Flora Lipps with [facility] on 09/16/2021, had NM stress test normal in 2021 and echo w/ normal LV function 2021 as well. Patient also reports increased fatigue for several months, sleeping 10-12 hours at night and taking 3-4 hours naps during the day. Denies numbness/tingling, asymmetric weakness, visual changes. Endorses a mild headache on top.   Past Medical History:  Diagnosis Date  . Acid reflux   . Apocrine metaplasia of breast   . Asthma   . BV (bacterial vaginosis)   . Depression    h/o  . Endometriosis    h/o  . HSV-1 (herpes simplex virus 1) infection   . Hyperlipidemia   . Hypothyroid   . Kidney disease   . Obese   . Seasonal allergies        Home Medications Prior to Admission medications   Medication Sig Start Date End Date  Taking? Authorizing Provider  albuterol (VENTOLIN HFA) 108 (90 Base) MCG/ACT inhaler Inhale 1-2 puffs into the lungs every 6 (six) hours as needed (for bronchospasm).   Yes [provider]  ascorbic acid (VITAMIN C) 500 MG tablet Take 500 mg by mouth daily.   Yes [provider]  escitalopram (LEXAPRO) 10 MG tablet Take 10 mg by mouth daily.   Yes [provider]  levothyroxine (SYNTHROID) 75 MCG tablet 1 tablet in the morning on an empty stomach   Yes [provider]  Multiple Vitamins-Minerals (MULTIVITAMINS THER. W/MINERALS) TABS Take 1 tablet by mouth daily.   Yes [provider]  polyvinyl alcohol (LIQUIFILM TEARS) 1.4 % ophthalmic solution Place 1 drop into both eyes daily as needed for dry eyes.   Yes [provider]  rosuvastatin (CRESTOR) 40 MG tablet Take 40 mg by mouth daily. 03/07/20  Yes [provider]      Allergies    Erythromycin, Amoxicillin-pot clavulanate, Darvocet [propoxyphene n-acetaminophen], Nortriptyline hcl, and Wellbutrin [bupropion]    Review of Systems   Review of Systems A 10 point review of systems was performed and is negative unless otherwise reported in HPI.  Physical Exam Updated Vital Signs BP (!) 143/73 (BP Location: Left Arm)   Pulse 67   Temp 98.3 F (36.8 C) (Oral)   Resp 15   Ht 5\' 5"  (1.651 m)   Wt 97.2 kg  SpO2 97%   BMI 35.66 kg/m  Physical Exam General: Normal appearing female, lying in bed.  HEENT: PERRLA, Sclera anicteric, MMM, trachea midline.  Cardiology: RRR, no murmurs/rubs/gallops. BL radial and DP pulses equal bilaterally.  Resp: Normal respiratory rate and effort. CTAB, no wheezes, rhonchi, crackles.  Abd: Soft, non-tender, non-distended. No rebound tenderness or guarding.  GU: Deferred. MSK: No peripheral edema or signs of trauma. Extremities without deformity or TTP. No cyanosis or clubbing. Skin: warm, dry. No rashes or lesions. Back: No CVA tenderness Neuro:  A&Ox4, CNs II-XII grossly intact. MAEs. Sensation grossly intact.  Psych: Normal mood and affect.   ED Results / Procedures / Treatments   Labs (all labs ordered are listed, but only abnormal results are displayed) Labs Reviewed  BASIC METABOLIC PANEL - Abnormal; Notable for the following components:      Result Value   Glucose, Bld 157 (*)    Creatinine, Ser 1.27 (*)    GFR, Estimated 46 (*)    All other components within normal limits  MRSA NEXT GEN BY PCR, NASAL  CBC  D-DIMER, QUANTITATIVE (NOT AT [facility])  HIV ANTIBODY (ROUTINE TESTING W REFLEX)  TROPONIN I (HIGH SENSITIVITY)  TROPONIN I (HIGH SENSITIVITY)    EKG EKG Interpretation Date/Time:  Thursday June 30 2023 12:37:27 EST Ventricular Rate:  80 PR Interval:  170 QRS Duration:  82 QT Interval:  392 QTC Calculation: 452 R Axis:   -63  Text Interpretation: Normal sinus rhythm Left axis deviation Possible Anterior infarct , age undetermined Abnormal ECG When compared with ECG of 24-Feb-2021 12:35, No significant change since last tracing Confirmed by Meridee Score [phone]) on 06/30/2023 3:40:53 PM  Radiology DG Chest 2 View  Result Date: 06/30/2023 CLINICAL DATA:  Chest pain.  Chest pressure.  Left arm pain. EXAM: CHEST - 2 VIEW COMPARISON:  04/14/2021. FINDINGS: Bilateral lung fields are clear. Elevated right hemidiaphragm noted. Bilateral costophrenic angles are clear. Normal cardio-mediastinal silhouette. No acute osseous abnormalities. The soft tissues are within normal limits. IMPRESSION: *No active cardiopulmonary disease. Electronically Signed   By: Jules Schick M.D.   On: 06/30/2023 14:48    Procedures Procedures  {Document cardiac monitor, telemetry assessment procedure when appropriate:1}  Medications Ordered in ED Medications  nitroGLYCERIN (NITROSTAT) SL tablet 0.4 mg (0.4 mg Sublingual Given 06/30/23 1920)  acetaminophen (TYLENOL) tablet 650 mg (has no administration in time range)  ondansetron  (ZOFRAN) injection 4 mg (has no administration in time range)  enoxaparin (LOVENOX) injection 40 mg (has no administration in time range)  acetaminophen (TYLENOL) tablet 1,000 mg (1,000 mg Oral Given 06/30/23 2046)    ED Course/ Medical Decision Making/ A&P                          Medical Decision Making Amount and/or Complexity of Data Reviewed Labs: ordered. Decision-making details documented in ED Course. Radiology: ordered. Decision-making details documented in ED Course.  Risk OTC drugs. Prescription drug management. Decision regarding hospitalization.    This patient presents to the ED for concern of chest pain, this involves an extensive number of treatment options, and is a complaint that carries with it a high risk of complications and morbidity.  I considered the following differential and admission for this acute, potentially life threatening condition.   MDM:    DDX for chest pain includes but is not limited to: Consider ACS vs atypical angina. Patient does report chest pain easing with rest but also can  occur at rest. Patient cannot PERC out based on age and h/o carpal tunnel surgery in October; will obtain D dimer and reassess.  Also feels dizzy x several months, especially with standing from sitting or lying down. Consider orthostasis vs dehydration. Orthostatic vitals signs negative. Consider electrolyte derangements, hypo/hyperglycemia.    Clinical Course as of 06/30/23 2342  Thu Jun 30, 2023  1604 Basic metabolic panel(!) No electrolyte derangements. Cr similar to prior known CKD.  [HN]  1605 Troponin I (High Sensitivity): 4 1st trop neg [HN]  1605 CBC wnl [HN]  1605 DG Chest 2 View *No active cardiopulmonary disease. [HN]  1658 Troponin I (High Sensitivity): 6 Trop negative x2 [HN]  1820 Will consult to cardiology [HN]  1914 D-Dimer, Quant: <0.27 Neg dimer [HN]  1914 Awaiting cardiology c/s [HN]  1916 D/w Dr. Jacques Navy who states that patient does not  need to be admitted for cardiac workup from cardiology standpoint. States that her studies were very reassuring and her lipids are well controlled. If significantly concerned can obs on a medicine service.  [HN]  2028 Paitent with chest pain improved w/ nitroglycerin.  Discussed with patient about admission for observation versus discharge after discussion with cardiologist.  Given that her chest pain did improve with nitroglycerin I am further concerned that she may be having unstable angina.  Dr. Jacques Navy seemed to feel it reasonable to admit or discharge either way. Patient would prefer admission, and I tend to agree. Will consult to hospitalist for admission for obs. [HN]    Clinical Course User Index [HN] Loetta Rough, MD    Labs: I Ordered, and personally interpreted labs.  The pertinent results include: Those listed above  Imaging Studies ordered: CXR ordered from triage I independently visualized and interpreted imaging. I agree with the radiologist interpretation  Additional history obtained from chart review, family at bedside.    Cardiac Monitoring: .The patient was maintained on a cardiac monitor.  I personally viewed and interpreted the cardiac monitored which showed an underlying rhythm of: NSR  Reevaluation: After the interventions noted above, I reevaluated the patient and found that they have :improved  Social Determinants of Health: .Lives independently  Disposition:  Admit to medicine  Co morbidities that complicate the patient evaluation . Past Medical History:  Diagnosis Date  . Acid reflux   . Apocrine metaplasia of breast   . Asthma   . BV (bacterial vaginosis)   . Depression    h/o  . Endometriosis    h/o  . HSV-1 (herpes simplex virus 1) infection   . Hyperlipidemia   . Hypothyroid   . Kidney disease   . Obese   . Seasonal allergies      Medicines Meds ordered this encounter  Medications  . nitroGLYCERIN (NITROSTAT) SL tablet 0.4 mg  .  acetaminophen (TYLENOL) tablet 1,000 mg  . acetaminophen (TYLENOL) tablet 650 mg  . ondansetron (ZOFRAN) injection 4 mg  . enoxaparin (LOVENOX) injection 40 mg    I have reviewed the patients home medicines and have made adjustments as needed  Problem List / ED Course: Problem List Items Addressed This Visit   None Visit Diagnoses     Chest pain at rest    -  Primary   Dizziness                {Document critical care time when appropriate:1} {Document review of labs and clinical decision tools ie heart score, Chads2Vasc2 etc:1}  {Document your independent review of  radiology images, and any outside records:1} {Document your discussion with family members, caretakers, and with consultants:1} {Document social determinants of health affecting pt's care:1} {Document your decision making why or why not admission, treatments were needed:1}  This note was created using dictation software, which may contain spelling or grammatical errors.

## 2023-06-30 NOTE — ED Provider Notes (Incomplete)
[place] EMERGENCY DEPARTMENT AT [facility] Provider Note   CSN: [id number] Arrival date & time: 06/30/23  1255     History {Add pertinent medical, surgical, social history, OB history to HPI:1} Chief Complaint  Patient presents with  . Dizziness    [name] is a 67 y.o. female with PMH as listed below who presents with several symptoms, including chest pain radiating to left arm yesterday, shortness of breath, and dizziness. Dizziness happens especially when she gets up from sitting or laying down. A/w nausea x 4 days with no vomiting. Stayed in bed most of day yesterday trying not to move. Also had "heaviness in chest and down the arm," which started yesterday. Can last 15-20 seconds and then eases off for a little while. Feels SOB intermittently when the pain hits. Resting makes the pain better, lying down also makes it better. Pain comes on when she moves around but also at rest coming and going. Went to see PCP today who sent to ED. No h/o DVT/PE. In October 2024 she had carpal tunnel surgery. Also endorses some bilateral leg swelling. No unilateral leg swelling. Does use estrogen vaginal cream intermittently. Had seen Dr. Flora Lipps with [facility] on 09/16/2021, had NM stress test normal in 2021 and echo w/ normal LV function 2021 as well. Patient also reports increased fatigue for several months, sleeping 10-12 hours at night and taking 3-4 hours naps during the day. Denies numbness/tingling, asymmetric weakness, visual changes. Endorses a mild headache on top.   Past Medical History:  Diagnosis Date  . Acid reflux   . Apocrine metaplasia of breast   . Asthma   . BV (bacterial vaginosis)   . Depression    h/o  . Endometriosis    h/o  . HSV-1 (herpes simplex virus 1) infection   . Hyperlipidemia   . Hypothyroid   . Kidney disease   . Obese   . Seasonal allergies        Home Medications Prior to Admission medications   Medication Sig Start Date End Date  Taking? Authorizing Provider  albuterol (VENTOLIN HFA) 108 (90 Base) MCG/ACT inhaler Inhale 1-2 puffs into the lungs every 6 (six) hours as needed (for bronchospasm).   Yes [provider]  ascorbic acid (VITAMIN C) 500 MG tablet Take 500 mg by mouth daily.   Yes [provider]  escitalopram (LEXAPRO) 10 MG tablet Take 10 mg by mouth daily.   Yes [provider]  levothyroxine (SYNTHROID) 75 MCG tablet 1 tablet in the morning on an empty stomach   Yes [provider]  Multiple Vitamins-Minerals (MULTIVITAMINS THER. W/MINERALS) TABS Take 1 tablet by mouth daily.   Yes [provider]  polyvinyl alcohol (LIQUIFILM TEARS) 1.4 % ophthalmic solution Place 1 drop into both eyes daily as needed for dry eyes.   Yes [provider]  rosuvastatin (CRESTOR) 40 MG tablet Take 40 mg by mouth daily. 03/07/20  Yes [provider]      Allergies    Erythromycin, Amoxicillin-pot clavulanate, Darvocet [propoxyphene n-acetaminophen], Nortriptyline hcl, and Wellbutrin [bupropion]    Review of Systems   Review of Systems A 10 point review of systems was performed and is negative unless otherwise reported in HPI.  Physical Exam Updated Vital Signs BP (!) 143/73 (BP Location: Left Arm)   Pulse 67   Temp 98.3 F (36.8 C) (Oral)   Resp 15   Ht 5\' 5"  (1.651 m)   Wt 97.2 kg  SpO2 97%   BMI 35.66 kg/m  Physical Exam General: Normal appearing female, lying in bed.  HEENT: PERRLA, Sclera anicteric, MMM, trachea midline.  Cardiology: RRR, no murmurs/rubs/gallops. BL radial and DP pulses equal bilaterally.  Resp: Normal respiratory rate and effort. CTAB, no wheezes, rhonchi, crackles.  Abd: Soft, non-tender, non-distended. No rebound tenderness or guarding.  GU: Deferred. MSK: No peripheral edema or signs of trauma. Extremities without deformity or TTP. No cyanosis or clubbing. Skin: warm, dry. No rashes or lesions. Back: No CVA tenderness Neuro:  A&Ox4, CNs II-XII grossly intact. MAEs. Sensation grossly intact.  Psych: Normal mood and affect.   ED Results / Procedures / Treatments   Labs (all labs ordered are listed, but only abnormal results are displayed) Labs Reviewed  BASIC METABOLIC PANEL - Abnormal; Notable for the following components:      Result Value   Glucose, Bld 157 (*)    Creatinine, Ser 1.27 (*)    GFR, Estimated 46 (*)    All other components within normal limits  MRSA NEXT GEN BY PCR, NASAL  CBC  D-DIMER, QUANTITATIVE (NOT AT [facility])  HIV ANTIBODY (ROUTINE TESTING W REFLEX)  TROPONIN I (HIGH SENSITIVITY)  TROPONIN I (HIGH SENSITIVITY)    EKG EKG Interpretation Date/Time:  Thursday June 30 2023 12:37:27 EST Ventricular Rate:  80 PR Interval:  170 QRS Duration:  82 QT Interval:  392 QTC Calculation: 452 R Axis:   -63  Text Interpretation: Normal sinus rhythm Left axis deviation Possible Anterior infarct , age undetermined Abnormal ECG When compared with ECG of 24-Feb-2021 12:35, No significant change since last tracing Confirmed by Meridee Score [phone]) on 06/30/2023 3:40:53 PM  Radiology DG Chest 2 View  Result Date: 06/30/2023 CLINICAL DATA:  Chest pain.  Chest pressure.  Left arm pain. EXAM: CHEST - 2 VIEW COMPARISON:  04/14/2021. FINDINGS: Bilateral lung fields are clear. Elevated right hemidiaphragm noted. Bilateral costophrenic angles are clear. Normal cardio-mediastinal silhouette. No acute osseous abnormalities. The soft tissues are within normal limits. IMPRESSION: *No active cardiopulmonary disease. Electronically Signed   By: Jules Schick M.D.   On: 06/30/2023 14:48    Procedures Procedures  {Document cardiac monitor, telemetry assessment procedure when appropriate:1}  Medications Ordered in ED Medications  nitroGLYCERIN (NITROSTAT) SL tablet 0.4 mg (0.4 mg Sublingual Given 06/30/23 1920)  acetaminophen (TYLENOL) tablet 650 mg (has no administration in time range)  ondansetron  (ZOFRAN) injection 4 mg (has no administration in time range)  enoxaparin (LOVENOX) injection 40 mg (has no administration in time range)  acetaminophen (TYLENOL) tablet 1,000 mg (1,000 mg Oral Given 06/30/23 2046)    ED Course/ Medical Decision Making/ A&P                          Medical Decision Making Amount and/or Complexity of Data Reviewed Labs: ordered. Decision-making details documented in ED Course. Radiology: ordered. Decision-making details documented in ED Course.  Risk OTC drugs. Prescription drug management. Decision regarding hospitalization.    This patient presents to the ED for concern of chest pain, this involves an extensive number of treatment options, and is a complaint that carries with it a high risk of complications and morbidity.  I considered the following differential and admission for this acute, potentially life threatening condition.   MDM:    DDX for chest pain includes but is not limited to: Consider ACS vs atypical angina. Patient does report chest pain easing with rest but also can  occur at rest. Patient cannot PERC out based on age and h/o carpal tunnel surgery in October; will obtain D dimer and reassess.  Also feels dizzy x several months, especially with standing from sitting or lying down. Consider orthostasis vs dehydration. Orthostatic vitals signs negative. Consider electrolyte derangements, hypo/hyperglycemia.    Clinical Course as of 06/30/23 2342  Thu Jun 30, 2023  1604 Basic metabolic panel(!) No electrolyte derangements. Cr similar to prior known CKD.  [HN]  1605 Troponin I (High Sensitivity): 4 1st trop neg [HN]  1605 CBC wnl [HN]  1605 DG Chest 2 View *No active cardiopulmonary disease. [HN]  1658 Troponin I (High Sensitivity): 6 Trop negative x2 [HN]  1820 Will consult to cardiology [HN]  1914 D-Dimer, Quant: <0.27 Neg dimer [HN]  1914 Awaiting cardiology c/s [HN]  1916 D/w Dr. Jacques Navy who states that patient does not  need to be admitted for cardiac workup from cardiology standpoint. States that her studies were very reassuring and her lipids are well controlled. If significantly concerned can obs on a medicine service.  [HN]  2028 Paitent with chest pain improved w/ nitroglycerin.  Discussed with patient about admission for observation versus discharge after discussion with cardiologist.  Given that her chest pain did improve with nitroglycerin I am further concerned that she may be having unstable angina.  Dr. Jacques Navy seemed to feel it reasonable to admit or discharge either way. Patient would prefer admission, and I tend to agree. Will consult to hospitalist for admission for obs. [HN]    Clinical Course User Index [HN] Loetta Rough, MD    Labs: I Ordered, and personally interpreted labs.  The pertinent results include: Those listed above  Imaging Studies ordered: CXR ordered from triage I independently visualized and interpreted imaging. I agree with the radiologist interpretation  Additional history obtained from chart review, family at bedside.    Cardiac Monitoring: .The patient was maintained on a cardiac monitor.  I personally viewed and interpreted the cardiac monitored which showed an underlying rhythm of: NSR  Reevaluation: After the interventions noted above, I reevaluated the patient and found that they have :improved  Social Determinants of Health: .Lives independently  Disposition:  Admit to medicine  Co morbidities that complicate the patient evaluation . Past Medical History:  Diagnosis Date  . Acid reflux   . Apocrine metaplasia of breast   . Asthma   . BV (bacterial vaginosis)   . Depression    h/o  . Endometriosis    h/o  . HSV-1 (herpes simplex virus 1) infection   . Hyperlipidemia   . Hypothyroid   . Kidney disease   . Obese   . Seasonal allergies      Medicines Meds ordered this encounter  Medications  . nitroGLYCERIN (NITROSTAT) SL tablet 0.4 mg  .  acetaminophen (TYLENOL) tablet 1,000 mg  . acetaminophen (TYLENOL) tablet 650 mg  . ondansetron (ZOFRAN) injection 4 mg  . enoxaparin (LOVENOX) injection 40 mg    I have reviewed the patients home medicines and have made adjustments as needed  Problem List / ED Course: Problem List Items Addressed This Visit   None Visit Diagnoses     Chest pain at rest    -  Primary   Dizziness                {Document critical care time when appropriate:1} {Document review of labs and clinical decision tools ie heart score, Chads2Vasc2 etc:1}  {Document your independent review of  radiology images, and any outside records:1} {Document your discussion with family members, caretakers, and with consultants:1} {Document social determinants of health affecting pt's care:1} {Document your decision making why or why not admission, treatments were needed:1}  This note was created using dictation software, which may contain spelling or grammatical errors.

## 2023-06-30 NOTE — ED Triage Notes (Addendum)
Pt states she has not been able to sleep well for past few months. Pt states when she woke up yesterday she was dizzy. Pt states she started having chest pressure and left arm pain yesterday as well. Pt has had nausea x 4 days. Pt has had shortness of breath. Pt sent to ED from doctor's office.

## 2023-06-30 NOTE — ED Notes (Signed)
ED TO INPATIENT HANDOFF REPORT  ED Nurse Name and Phone #: Rodney Booze [phone]  S Name/Age/Gender [name] 67 y.o. female Room/Bed: 010C/010C  Code Status   Code Status: Full Code  Home/SNF/Other Home Patient oriented to: self, place, time, and situation Is this baseline? Yes   Triage Complete: Triage complete  Chief Complaint Chest pain, rule out acute myocardial infarction [R07.9]  Triage Note Pt states she has not been able to sleep well for past few months. Pt states when she woke up yesterday she was dizzy. Pt states she started having chest pressure and left arm pain yesterday as well. Pt has had nausea x 4 days. Pt has had shortness of breath. Pt sent to ED from doctor's office.    Allergies Allergies  Allergen Reactions   Erythromycin     Abd pain   Amoxicillin-Pot Clavulanate Nausea And Vomiting   Darvocet [Propoxyphene N-Acetaminophen] Nausea And Vomiting   Nortriptyline Hcl Other (See Comments)    Weight gain   Wellbutrin [Bupropion] Other (See Comments)    sweating    Level of Care/Admitting Diagnosis ED Disposition     ED Disposition  Admit   Condition  --   Comment  Hospital Area: [facility] [[id number]]  Level of Care: Telemetry Cardiac [[id number]]  May place patient in observation at [facility] or [facility] if equivalent level of care is available:: No  Covid Evaluation: Asymptomatic - no recent exposure (last 10 days) testing not required  Diagnosis: Chest pain, rule out acute myocardial infarction [098119]  Admitting Physician: Hillary Bow [[id number]]  Attending Physician: Hillary Bow [[id number]]          B Medical/Surgery History Past Medical History:  Diagnosis Date   Acid reflux    Apocrine metaplasia of breast    Asthma    BV (bacterial vaginosis)    Depression    h/o   Endometriosis    h/o   HSV-1 (herpes simplex virus 1) infection    Hyperlipidemia    Hypothyroid    Kidney disease    Obese    Seasonal  allergies    Past Surgical History:  Procedure Laterality Date   ABDOMINAL HYSTERECTOMY     CESAREAN SECTION     x3   CHOLECYSTECTOMY N/A 04/11/2021   Procedure: LAPAROSCOPIC CHOLECYSTECTOMY;  Surgeon: [name], MD;  Location: [facility] OR;  Service: General;  Laterality: N/A;   dental work     cap placed 02-02-16   LEG SURGERY     had non cancerous tumors removed from left leg   SINUS ENDO W/FUSION     x3   TUBAL LIGATION     WISDOM TOOTH EXTRACTION       A IV Location/Drains/Wounds Patient Lines/Drains/Airways Status     Active Line/Drains/Airways     None            Intake/Output Last 24 hours No intake or output data in the 24 hours ending 06/30/23 2108  Labs/Imaging Results for orders placed or performed during the hospital encounter of 06/30/23 (from the past 48 hour(s))  Basic metabolic panel     Status: Abnormal   Collection Time: 06/30/23  1:04 PM  Result Value Ref Range   Sodium 139 135 - 145 mmol/L   Potassium 3.8 3.5 - 5.1 mmol/L   Chloride 109 98 - 111 mmol/L   CO2 22 22 - 32 mmol/L   Glucose, Bld 157 (H) 70 - 99 mg/dL  Comment: Glucose reference range applies only to samples taken after fasting for at least 8 hours.   BUN 19 8 - 23 mg/dL   Creatinine, Ser 4.09 (H) 0.44 - 1.00 mg/dL   Calcium 9.8 8.9 - 81.1 mg/dL   GFR, Estimated 46 (L) >60 mL/min    Comment: (NOTE) Calculated using the CKD-EPI Creatinine Equation (2021)    Anion gap 8 5 - 15    Comment: Performed at [facility], [address] [place], [place] [address]  CBC     Status: None   Collection Time: 06/30/23  1:04 PM  Result Value Ref Range   WBC 7.4 4.0 - 10.5 K/uL   RBC 4.67 3.87 - 5.11 MIL/uL   Hemoglobin 13.5 12.0 - 15.0 g/dL   HCT 29.5 62.1 - 30.8 %   MCV 89.1 80.0 - 100.0 fL   MCH 28.9 26.0 - 34.0 pg   MCHC 32.5 30.0 - 36.0 g/dL   RDW 65.7 84.6 - 96.2 %   Platelets 231 150 - 400 K/uL   nRBC 0.0 0.0 - 0.2 %    Comment: Performed at [facility],  [address], [place], [place] [address]  Troponin I (High Sensitivity)     Status: None   Collection Time: 06/30/23  1:04 PM  Result Value Ref Range   Troponin I (High Sensitivity) 4 <18 ng/L    Comment: (NOTE) Elevated high sensitivity troponin I (hsTnI) values and significant  changes across serial measurements may suggest ACS but many other  chronic and acute conditions are known to elevate hsTnI results.  Refer to the "Links" section for chest pain algorithms and additional  guidance. Performed at [facility], [address] [place], [place] [address]   Troponin I (High Sensitivity)     Status: None   Collection Time: 06/30/23  3:12 PM  Result Value Ref Range   Troponin I (High Sensitivity) 6 <18 ng/L    Comment: (NOTE) Elevated high sensitivity troponin I (hsTnI) values and significant  changes across serial measurements may suggest ACS but many other  chronic and acute conditions are known to elevate hsTnI results.  Refer to the "Links" section for chest pain algorithms and additional  guidance. Performed at [facility], [address] [place], [place] [address]   D-dimer, quantitative     Status: None   Collection Time: 06/30/23  6:35 PM  Result Value Ref Range   D-Dimer, Quant <0.27 0.00 - 0.50 ug/mL-FEU    Comment: (NOTE) At the manufacturer cut-off value of 0.5 g/mL FEU, this assay has a negative predictive value of 95-100%.This assay is intended for use in conjunction with a clinical pretest probability (PTP) assessment model to exclude pulmonary embolism (PE) and deep venous thrombosis (DVT) in outpatients suspected of PE or DVT. Results should be correlated with clinical presentation. Performed at [facility], [address] [place], [place] [address]    DG Chest 2 View  Result Date: 06/30/2023 CLINICAL DATA:  Chest pain.  Chest pressure.  Left arm pain. EXAM: CHEST - 2 VIEW COMPARISON:  04/14/2021. FINDINGS: Bilateral lung fields are  clear. Elevated right hemidiaphragm noted. Bilateral costophrenic angles are clear. Normal cardio-mediastinal silhouette. No acute osseous abnormalities. The soft tissues are within normal limits. IMPRESSION: *No active cardiopulmonary disease. Electronically Signed   By: Jules Schick M.D.   On: 06/30/2023 14:48    Pending Labs Unresulted Labs (From admission, onward)  Start     Ordered   06/30/23 2049  HIV Antibody (routine testing w rflx)  (HIV Antibody (Routine testing w reflex) panel)  Once,   R        06/30/23 2050            Vitals/Pain Today's Vitals   06/30/23 1845 06/30/23 2000 06/30/23 2030 06/30/23 2045  BP: 134/72 134/65 133/66 120/84  Pulse: 65 74 66 66  Resp:      Temp:      TempSrc:      SpO2: 96% 96% 98% 97%  Weight:      Height:      PainSc:        Isolation Precautions No active isolations  Medications Medications  nitroGLYCERIN (NITROSTAT) SL tablet 0.4 mg (0.4 mg Sublingual Given 06/30/23 1920)  acetaminophen (TYLENOL) tablet 650 mg (has no administration in time range)  ondansetron (ZOFRAN) injection 4 mg (has no administration in time range)  enoxaparin (LOVENOX) injection 40 mg (has no administration in time range)  acetaminophen (TYLENOL) tablet 1,000 mg (1,000 mg Oral Given 06/30/23 2046)    Mobility walks     Focused Assessments Cardiac Assessment Handoff:  Cardiac Rhythm: Normal sinus rhythm Lab Results  Component Value Date   CKTOTAL 34 03/01/2007   CKMB 0.7 03/01/2007   TROPONINI <0.30 03/17/2014   Lab Results  Component Value Date   DDIMER <0.27 06/30/2023   Does the Patient currently have chest pain? No    R Recommendations: See Admitting Provider Note  Report given to:   Additional Notes:

## 2023-06-30 NOTE — Assessment & Plan Note (Addendum)
Trop neg, d.dimer neg Improved after NTG EDP d/w Dr. Daisey Must as per HPI above, requests obs for CP. CP obs pathway Tele monitor NPO after MN Message sent to P. Trent for cards eval in AM

## 2023-07-01 ENCOUNTER — Observation Stay (HOSPITAL_BASED_OUTPATIENT_CLINIC_OR_DEPARTMENT_OTHER): Payer: PPO

## 2023-07-01 DIAGNOSIS — E782 Mixed hyperlipidemia: Secondary | ICD-10-CM

## 2023-07-01 DIAGNOSIS — I2510 Atherosclerotic heart disease of native coronary artery without angina pectoris: Secondary | ICD-10-CM

## 2023-07-01 DIAGNOSIS — R42 Dizziness and giddiness: Secondary | ICD-10-CM | POA: Diagnosis not present

## 2023-07-01 DIAGNOSIS — E038 Other specified hypothyroidism: Secondary | ICD-10-CM

## 2023-07-01 DIAGNOSIS — E785 Hyperlipidemia, unspecified: Secondary | ICD-10-CM | POA: Diagnosis present

## 2023-07-01 DIAGNOSIS — R079 Chest pain, unspecified: Secondary | ICD-10-CM | POA: Diagnosis not present

## 2023-07-01 LAB — BASIC METABOLIC PANEL
Anion gap: 10 (ref 5–15)
BUN: 18 mg/dL (ref 8–23)
CO2: 22 mmol/L (ref 22–32)
Calcium: 9 mg/dL (ref 8.9–10.3)
Chloride: 107 mmol/L (ref 98–111)
Creatinine, Ser: 1.09 mg/dL — ABNORMAL HIGH (ref 0.44–1.00)
GFR, Estimated: 56 mL/min — ABNORMAL LOW (ref >60)
Glucose, Bld: 111 mg/dL — ABNORMAL HIGH (ref 70–99)
Potassium: 3.9 mmol/L (ref 3.5–5.1)
Sodium: 139 mmol/L (ref 135–145)

## 2023-07-01 LAB — MRSA NEXT GEN BY PCR, NASAL: MRSA by PCR Next Gen: NOT DETECTED

## 2023-07-01 LAB — TSH: TSH: 1.839 u[IU]/mL (ref 0.350–4.500)

## 2023-07-01 LAB — HIV ANTIBODY (ROUTINE TESTING W REFLEX): HIV Screen 4th Generation wRfx: NONREACTIVE

## 2023-07-01 MED ORDER — IOHEXOL 350 MG/ML SOLN
100.0000 mL | Freq: Once | INTRAVENOUS | Status: AC | PRN
  Administered 2023-07-01: 100 mL via INTRAVENOUS

## 2023-07-01 MED ORDER — ISOSORBIDE MONONITRATE ER 30 MG PO TB24
15.0000 mg | ORAL_TABLET | Freq: Every day | ORAL | Status: DC
  Administered 2023-07-01 – 2023-07-02 (×2): 15 mg via ORAL
  Filled 2023-07-01 (×2): qty 1

## 2023-07-01 MED ORDER — METOPROLOL TARTRATE 5 MG/5ML IV SOLN: 5.0000 mg | Freq: Once | INTRAVENOUS | Status: DC

## 2023-07-01 MED ORDER — METOPROLOL TARTRATE 5 MG/5ML IV SOLN
INTRAVENOUS | Status: AC
  Filled 2023-07-01: qty 5

## 2023-07-01 MED ORDER — NITROGLYCERIN 0.4 MG SL SUBL
0.8000 mg | SUBLINGUAL_TABLET | Freq: Once | SUBLINGUAL | Status: AC
  Administered 2023-07-01: 0.8 mg via SUBLINGUAL

## 2023-07-01 MED ORDER — ESCITALOPRAM OXALATE 10 MG PO TABS
10.0000 mg | ORAL_TABLET | Freq: Every day | ORAL | Status: DC
  Administered 2023-07-01 – 2023-07-02 (×2): 10 mg via ORAL
  Filled 2023-07-01 (×2): qty 1

## 2023-07-01 MED ORDER — ROSUVASTATIN CALCIUM 20 MG PO TABS
40.0000 mg | ORAL_TABLET | Freq: Every day | ORAL | Status: DC
  Administered 2023-07-01: 40 mg via ORAL
  Filled 2023-07-01: qty 2

## 2023-07-01 MED ORDER — PANTOPRAZOLE SODIUM 40 MG PO TBEC
40.0000 mg | DELAYED_RELEASE_TABLET | Freq: Every day | ORAL | Status: DC
  Administered 2023-07-01 – 2023-07-02 (×2): 40 mg via ORAL
  Filled 2023-07-01 (×2): qty 1

## 2023-07-01 MED ORDER — NITROGLYCERIN 0.4 MG SL SUBL
SUBLINGUAL_TABLET | SUBLINGUAL | Status: AC
  Filled 2023-07-01: qty 1

## 2023-07-01 MED ORDER — LEVOTHYROXINE SODIUM 75 MCG PO TABS
75.0000 ug | ORAL_TABLET | Freq: Every day | ORAL | Status: DC
  Administered 2023-07-01 – 2023-07-02 (×2): 75 ug via ORAL
  Filled 2023-07-01 (×2): qty 1

## 2023-07-01 NOTE — Progress Notes (Signed)
Mobility Specialist Progress Note:   07/01/23 0900  Mobility  Activity Ambulated with assistance in hallway  Level of Assistance Contact guard assist, steadying assist  Assistive Device None  Distance Ambulated (ft) 300 ft  Activity Response Tolerated well  Mobility Referral Yes  $Mobility charge 1 Mobility  Mobility Specialist Start Time (ACUTE ONLY) 0930  Mobility Specialist Stop Time (ACUTE ONLY) [id number]  Mobility Specialist Time Calculation (min) (ACUTE ONLY) 8 min    Pre Mobility: 77 HR,  97% SpO2 During Mobility: 87 HR,  96% SpO2 Post Mobility:  75 HR,  96% SpO2  Pt received in bed, agreeable to mobility. C/o feeling a little unsteady/dizzy d/t being up for the first time today. Otherwise asymptomatic. Returned to room w/o fault. Pt left in bed with call bell and all needs met.  [name] Mobility Specialist Please contact via Special educational needs teacher or Rehab office at [phone]

## 2023-07-01 NOTE — Care Management Obs Status (Signed)
MEDICARE OBSERVATION STATUS NOTIFICATION   Patient Details  Name: [name] MRN: [id number] Date of Birth: Oct 04, 1955   Medicare Observation Status Notification Given:  Yes    Darrold Span, RN 07/01/2023, 2:39 PM

## 2023-07-01 NOTE — Plan of Care (Signed)

## 2023-07-01 NOTE — Progress Notes (Signed)
[facility]                                                                              [name], is a 67 y.o. female, DOB - [birth year], HYQ:[id number] Admit date - 06/30/2023    Outpatient Primary MD for the patient is Michele Else, MD (Inactive)  LOS - 0  days  Chief Complaint  Patient presents with   Dizziness       Brief summary   Patient is a 67 year old female with hypothyroidism, hyperlipidemia, obesity presented to ED with chest pain.  Patient reported chest pain radiating to left arm, started a day before the admission, associated with shortness of breath, dizziness.  Also reported nausea for last 4 days but no vomiting.  Resting improves her symptoms.  Patient went to see her PCP on the day of admission who referred the patient to ED.  No history of DVT/PE.  Chest pain did improve with nitroglycerin in ED. Patient had seen Dr. Bufford Buttner with [facility] heart care on /03/2022 and had a normal nuclear medicine stress test in 11/2020 and 2D echo with normal LV function in 12/2020 Also reports increasing fatigue for the last several months, sleeping 10 to 12 hours at night and taking naps during the day.  No numbness tingling, asymmetric weakness, visual changes.  Endorses mild headache.   Assessment & Plan    Principal Problem:   Chest pain, rule out acute myocardial infarction -Somewhat weak history however still having persistent chest pain 2/10, improved with nitroglycerin in ED. -Currently no acute shortness of breath, nausea vomiting. -Troponins 4-6, D-dimer <0.27 -Continue current management, will await cardiology recommendations regarding inpatient versus outpatient workup  Active Problems:   Hypothyroidism -Continue Synthroid, obtain TSH    Migraine -Currently stable, continue Tylenol as needed    GERD (gastroesophageal reflux disease) -Continue Protonix -EGD 2015 was normal, colonoscopy 6/27 showed diverticulosis otherwise normal    Asthma,  chronic -Currently no wheezing, stable    Hyperlipidemia -Continue statin  Obesity Estimated body mass index is 35.66 kg/m as calculated from the following:   Height as of this encounter: 5\' 5"  (1.651 m).   Weight as of this encounter: 97.2 kg.  Code Status: Full code DVT Prophylaxis:  enoxaparin (LOVENOX) injection 40 mg Start: 07/01/23 1000   Level of Care: Level of care: Telemetry Cardiac Family Communication: Updated patient Disposition Plan:      Remains inpatient appropriate: Awaiting cardiology evaluation, PT OT evaluation   Procedures:  None  Consultants:   Cardiology  Antimicrobials:   Anti-infectives (From admission, onward)    None          Medications  enoxaparin (LOVENOX) injection  40 mg Subcutaneous Q24H   escitalopram  10 mg Oral Daily   levothyroxine  75 mcg Oral Q0600   pantoprazole  40 mg Oral Q0600   rosuvastatin  40 mg Oral QHS      Subjective:   [name] was seen and examined today.  Still having persisting chest pain 2/10, midsternal, radiating to left breast and arm.  No acute dizziness, shortness of breath, nausea or vomiting.  Feeling weak.  Objective:   Vitals:   06/30/23 2225 06/30/23 2300 07/01/23 0414 07/01/23 0745  BP: (!) 143/73 (!) 143/61 111/63 117/66  Pulse: 67 64 67   Resp: 15 16 16    Temp: 98.3 F (36.8 C) 98.7 F (37.1 C) 97.9 F (36.6 C) 98.1 F (36.7 C)  TempSrc: Oral Oral Oral Oral  SpO2: 97% 95% 98%   Weight: 97.2 kg     Height: 5\' 5"  (1.651 m)      No intake or output data in the 24 hours ending 07/01/23 1121   Wt Readings from Last 3 Encounters:  06/30/23 97.2 kg  07/06/22 93.9 kg  09/16/21 90.2 kg     Exam General: Alert and oriented x 3, NAD Cardiovascular: S1 S2 auscultated,  RRR Respiratory: Clear to auscultation bilaterally, no wheezing Gastrointestinal: Soft, nontender, nondistended, + bowel sounds Ext: no pedal edema bilaterally Neuro: no new deficits Psych: Normal affect      Data Reviewed:  I have personally reviewed following labs    CBC Lab Results  Component Value Date   WBC 7.4 06/30/2023   RBC 4.67 06/30/2023   HGB 13.5 06/30/2023   HCT 41.6 06/30/2023   MCV 89.1 06/30/2023   MCH 28.9 06/30/2023   PLT 231 06/30/2023   MCHC 32.5 06/30/2023   RDW 12.5 06/30/2023   LYMPHSABS 2.4 04/20/2022   MONOABS 0.5 04/20/2022   EOSABS 0.1 04/20/2022   BASOSABS 0.1 04/20/2022     Last metabolic panel Lab Results  Component Value Date   NA 139 07/01/2023   K 3.9 07/01/2023   CL 107 07/01/2023   CO2 22 07/01/2023   BUN 18 07/01/2023   CREATININE 1.09 (H) 07/01/2023   GLUCOSE 111 (H) 07/01/2023   GFRNONAA 56 (L) 07/01/2023   GFRAA >60 08/24/2015   CALCIUM 9.0 07/01/2023   PROT 7.8 04/20/2022   ALBUMIN 4.3 04/20/2022   BILITOT 0.6 04/20/2022   ALKPHOS 58 04/20/2022   AST 34 04/20/2022   ALT 26 04/20/2022   ANIONGAP 10 07/01/2023    CBG (last 3)  No results for input(s): "GLUCAP" in the last 72 hours.    Coagulation Profile: No results for input(s): "INR", "PROTIME" in the last 168 hours.   Radiology Studies: I have personally reviewed the imaging studies  DG Chest 2 View  Result Date: 06/30/2023 CLINICAL DATA:  Chest pain.  Chest pressure.  Left arm pain. EXAM: CHEST - 2 VIEW COMPARISON:  04/14/2021. FINDINGS: Bilateral lung fields are clear. Elevated right hemidiaphragm noted. Bilateral costophrenic angles are clear. Normal cardio-mediastinal silhouette. No acute osseous abnormalities. The soft tissues are within normal limits. IMPRESSION: *No active cardiopulmonary disease. Electronically Signed   By: Jules Schick M.D.   On: 06/30/2023 14:48       Israel Wunder M.D. [facility] 07/01/2023, 11:21 AM  Available via Epic secure chat 7am-7pm After 7 pm, please refer to night coverage provider listed on amion.

## 2023-07-01 NOTE — Evaluation (Signed)
Physical Therapy Brief Evaluation and Discharge Note Patient Details Name: [name] MRN: [id number] DOB: 08-01-1956 Today's Date: 07/01/2023   History of Present Illness  Pt is a 67 y/o female admitted 11/21 with c/o CP.  CP radiates to L arm, onset yesterday.  Associated with SOB, dizziness.  Also nausea x4 days, no vomiting.  Cardiac source ruled out and suggestion made for stress as the cause.  PMHx, Endometriosis, HLD, KD  Clinical Impression  Pt is at or close to baseline functioning and should be safe at home with PRN Assist. There are no further acute PT needs.  Will sign off at this time.        PT Assessment    Assistance Needed at Discharge       Equipment Recommendations None recommended by PT  Recommendations for Other Services       Precautions/Restrictions Precautions Precautions: None        Mobility  Bed Mobility          Transfers Overall transfer level: Modified independent                      Ambulation/Gait Ambulation/Gait assistance: Independent Gait Distance (Feet): 300 Feet Assistive device: None Gait Pattern/deviations: Step-through pattern   General Gait Details: stable gait at age appropriate speed.  Home Activity Instructions    Stairs Stairs: Yes Stairs assistance: Modified independent (Device/Increase time), Independent Stair Management: No rails, One rail Right, Alternating pattern, Forwards Number of Stairs: 10 General stair comments: safe on the stairs with/without rail  Modified Rankin (Stroke Patients Only)        Balance                          Pertinent Vitals/Pain   Pain Assessment Pain Assessment: No/denies pain     Home Living   Living Arrangements: Spouse/significant other       Home Equipment: None        Prior Function        UE/LE Assessment               Communication   Communication Communication: No apparent difficulties     Cognition          General Comments General comments (skin integrity, edema, etc.): vss    Exercises     Assessment/Plan    PT Problem List         PT Visit Diagnosis      No Skilled PT Patient is independent with all acitivity/mobility;Patient will have necessary level of assist by caregiver at discharge   Co-evaluation                AMPAC 6 Clicks Help needed turning from your back to your side while in a flat bed without using bedrails?: None Help needed moving from lying on your back to sitting on the side of a flat bed without using bedrails?: None Help needed moving to and from a bed to a chair (including a wheelchair)?: None Help needed standing up from a chair using your arms (e.g., wheelchair or bedside chair)?: None Help needed to walk in hospital room?: None Help needed climbing 3-5 steps with a railing? : None 6 Click Score: 24      End of Session   Activity Tolerance: Patient tolerated treatment well Patient left: in bed;with call bell/phone within reach Nurse Communication: Mobility status       Time: 6962-9528  PT Time Calculation (min) (ACUTE ONLY): 21 min  Charges:   PT Evaluation $PT Eval Low Complexity: 1 Low      07/01/2023  Jacinto Halim., PT Acute Rehabilitation Services [phone]  (office)  Eliseo Gum Keshana Klemz  07/01/2023, 5:35 PM

## 2023-07-01 NOTE — Consult Note (Addendum)
Cardiology Consultation   Patient ID: [name] MRN: [id number]; DOB: 02/01/1956  Admit date: 06/30/2023 Date of Consult: 07/01/2023  PCP:  Elias Else, MD (Inactive)   [facility] Providers Cardiologist:  None   Dr. Flora Lipps     Patient Profile:   [name] is a 67 y.o. female with a hx of hypothyroidism, hyperlipidemia, CKD and chest pain who is being seen 07/01/2023 for the evaluation of chest pain at the request of Dr. Isidoro Donning.  History of Present Illness:   [name] follows with Dr. Flora Lipps in our practice.  Last seen February 2023.  Known history of CKD, hypothyroidism, seen previously for episodes of heaviness on her chest.  Prior cardiovascular evaluation demonstrates normal echocardiogram, normal nuclear medicine stress test, and calcium score of 0 all in 2022.  She continued to have episodes of chest heaviness on the left side of her chest and described this at her last office visit.  It was felt related to stress.  Stress management techniques were recommended, medical therapy unchanged.  Patient presents to the ER yesterday with recurrent chest heaviness but describes this as a different sensation lower on her chest below the left breast radiating to the left axilla.  She feels the pain she was evaluated for previously was higher on the left precordium.  It does feel better when she lays down but is not specifically changed by position changes, deep breathing, and does not seem pleuritic in nature.  It is a chest heaviness.  Blood pressure mildly elevated this admission 140s over 60s to 70s today.  We discussed management strategies for nitro responsive chest discomfort, she does feel the nitroglycerin relieves the pain somewhat.  It is not particularly exertional in nature but does feel better when she rests.  Patient's EKG on presentation demonstrates sinus rhythm left axis deviation possible anterior infarct versus poor R wave progression which has  been seen before.  No significant change from prior EKG.  Telemetry demonstrates sinus rhythm heart rate in the 60s.  Pertinent laboratory studies significant for normal sodium and potassium, creatinine improved from 1.27-1.09 GFR 56, normal CBC.   Past Medical History:  Diagnosis Date   Acid reflux    Apocrine metaplasia of breast    Asthma    BV (bacterial vaginosis)    Depression    h/o   Endometriosis    h/o   HSV-1 (herpes simplex virus 1) infection    Hyperlipidemia    Hypothyroid    Kidney disease    Obese    Seasonal allergies     Past Surgical History:  Procedure Laterality Date   ABDOMINAL HYSTERECTOMY     CESAREAN SECTION     x3   CHOLECYSTECTOMY N/A 04/11/2021   Procedure: LAPAROSCOPIC CHOLECYSTECTOMY;  Surgeon: [name], MD;  Location: [facility] OR;  Service: General;  Laterality: N/A;   dental work     cap placed 02-02-16   LEG SURGERY     had non cancerous tumors removed from left leg   SINUS ENDO W/FUSION     x3   TUBAL LIGATION     WISDOM TOOTH EXTRACTION       Home Medications:  Prior to Admission medications   Medication Sig Start Date End Date Taking? Authorizing Provider  albuterol (VENTOLIN HFA) 108 (90 Base) MCG/ACT inhaler Inhale 1-2 puffs into the lungs every 6 (six) hours as needed (for bronchospasm).   Yes [provider]  ascorbic acid (VITAMIN C) 500 MG  tablet Take 500 mg by mouth daily.   Yes [provider]  escitalopram (LEXAPRO) 10 MG tablet Take 10 mg by mouth daily.   Yes [provider]  levothyroxine (SYNTHROID) 75 MCG tablet 1 tablet in the morning on an empty stomach   Yes [provider]  Multiple Vitamins-Minerals (MULTIVITAMINS THER. W/MINERALS) TABS Take 1 tablet by mouth daily.   Yes [provider]  polyvinyl alcohol (LIQUIFILM TEARS) 1.4 % ophthalmic solution Place 1 drop into both eyes daily as needed for dry eyes.   Yes [provider]  rosuvastatin (CRESTOR) 40 MG  tablet Take 40 mg by mouth daily. 03/07/20  Yes [provider]    Inpatient Medications: Scheduled Meds:  enoxaparin (LOVENOX) injection  40 mg Subcutaneous Q24H   escitalopram  10 mg Oral Daily   levothyroxine  75 mcg Oral Q0600   pantoprazole  40 mg Oral Q0600   rosuvastatin  40 mg Oral QHS   Continuous Infusions:  PRN Meds: acetaminophen, nitroGLYCERIN, ondansetron (ZOFRAN) IV  Allergies:    Allergies  Allergen Reactions   Erythromycin     Abd pain   Amoxicillin-Pot Clavulanate Nausea And Vomiting   Darvocet [Propoxyphene N-Acetaminophen] Nausea And Vomiting   Nortriptyline Hcl Other (See Comments)    Weight gain   Wellbutrin [Bupropion] Other (See Comments)    sweating    Social History:   Social History   Socioeconomic History   Marital status: Married    Spouse name: Not on file   Number of children: 3   Years of education: Not on file   Highest education level: Not on file  Occupational History   Occupation: Scientist, physiological: [employer]  Tobacco Use   Smoking status: Never   Smokeless tobacco: Never  Vaping Use   Vaping status: Never Used  Substance and Sexual Activity   Alcohol use: Yes    Comment: socially   Drug use: No   Sexual activity: Not on file    Comment: btl  Other Topics Concern   Not on file  Social History Narrative   Right handed   Lives husband two story with basement   Social Determinants of Health   Financial Resource Strain: Not on file  Food Insecurity: No Food Insecurity (06/30/2023)   Hunger Vital Sign    Worried About Running Out of Food in the Last Year: Never true    Ran Out of Food in the Last Year: Never true  Transportation Needs: No Transportation Needs (06/30/2023)   PRAPARE - Administrator, Civil Service (Medical): No    Lack of Transportation (Non-Medical): No  Physical Activity: Not on file  Stress: Not on file  Social Connections: Unknown (12/22/2021)   [facility]   Social Network    Social Network: Not on file  Intimate Partner Violence: Not At Risk (06/30/2023)   Humiliation, Afraid, Rape, and Kick questionnaire    Fear of Current or Ex-Partner: No    Emotionally Abused: No    Physically Abused: No    Sexually Abused: No    Family History:    Family History  Problem Relation Age of Onset   Cancer Maternal Uncle    Emphysema Maternal Uncle    Stroke Other    Asthma Mother    Mitral valve prolapse Mother    Thyroid disease Mother    Heart disease Brother    Mitral valve prolapse Brother  Cancer Maternal Aunt        uterine and liver and kidney   Diabetes Maternal Grandmother    Heart disease Maternal Grandmother    Thyroid disease Maternal Grandmother    Diabetes Paternal Grandmother    Diabetes Paternal Grandfather    Colon cancer Paternal Uncle    Stomach cancer Neg Hx    Rectal cancer Neg Hx      ROS:  Please see the history of present illness.   All other ROS reviewed and negative.     Physical Exam/Data:   Vitals:   06/30/23 2300 07/01/23 0414 07/01/23 0745 07/01/23 1131  BP: (!) 143/61 111/63 117/66 134/65  Pulse: 64 67    Resp: 16 16    Temp: 98.7 F (37.1 C) 97.9 F (36.6 C) 98.1 F (36.7 C) 98.3 F (36.8 C)  TempSrc: Oral Oral Oral Oral  SpO2: 95% 98%    Weight:      Height:       No intake or output data in the 24 hours ending 07/01/23 1158    06/30/2023   10:25 PM 06/30/2023    1:01 PM 07/06/2022    9:18 AM  Last 3 Weights  Weight (lbs) 214 lb 4.6 oz 215 lb 207 lb  Weight (kg) 97.2 kg 97.523 kg 93.895 kg     Body mass index is 35.66 kg/m.  General:  Well nourished, well developed, in no acute distress HEENT: normal Neck: no JVD Vascular: No carotid bruits; Distal pulses 2+ bilaterally Cardiac:  normal S1, S2; RRR; no murmur  Lungs:  clear to auscultation bilaterally, no wheezing, rhonchi or rales  Abd: soft, nontender, no hepatomegaly  Ext: no edema Musculoskeletal:   No deformities, BUE and BLE strength normal and equal Skin: warm and dry  Neuro:  CNs 2-12 intact, no focal abnormalities noted Psych:  Normal affect   EKG:  The EKG was personally reviewed and demonstrates:  SR, poor R wave progression Telemetry:  Telemetry was personally reviewed and demonstrates:  SR  Relevant CV Studies: pending  Laboratory Data:  High Sensitivity Troponin:   Recent Labs  Lab 06/30/23 1304 06/30/23 1512  TROPONINIHS 4 6     Chemistry Recent Labs  Lab 06/30/23 1304 07/01/23 0804  NA 139 139  K 3.8 3.9  CL 109 107  CO2 22 22  GLUCOSE 157* 111*  BUN 19 18  CREATININE 1.27* 1.09*  CALCIUM 9.8 9.0  GFRNONAA 46* 56*  ANIONGAP 8 10    No results for input(s): "PROT", "ALBUMIN", "AST", "ALT", "ALKPHOS", "BILITOT" in the last 168 hours. Lipids No results for input(s): "CHOL", "TRIG", "HDL", "LABVLDL", "LDLCALC", "CHOLHDL" in the last 168 hours.  Hematology Recent Labs  Lab 06/30/23 1304  WBC 7.4  RBC 4.67  HGB 13.5  HCT 41.6  MCV 89.1  MCH 28.9  MCHC 32.5  RDW 12.5  PLT 231   Thyroid No results for input(s): "TSH", "FREET4" in the last 168 hours.  BNPNo results for input(s): "BNP", "PROBNP" in the last 168 hours.  DDimer  Recent Labs  Lab 06/30/23 1835  DDIMER <0.27     Radiology/Studies:  DG Chest 2 View  Result Date: 06/30/2023 CLINICAL DATA:  Chest pain.  Chest pressure.  Left arm pain. EXAM: CHEST - 2 VIEW COMPARISON:  04/14/2021. FINDINGS: Bilateral lung fields are clear. Elevated right hemidiaphragm noted. Bilateral costophrenic angles are clear. Normal cardio-mediastinal silhouette. No acute osseous abnormalities. The soft tissues are within normal limits. IMPRESSION: *No active  cardiopulmonary disease. Electronically Signed   By: Jules Schick M.D.   On: 06/30/2023 14:48     Assessment and Plan:   Principal Problem:   Chest pain, rule out acute myocardial infarction Active Problems:   Hypothyroidism   GERD  (gastroesophageal reflux disease)   Asthma, chronic   Migraine   Hyperlipidemia  Chest pain -2 normal troponins, patient has been ruled out for MI.  The patient has recurrent chest heaviness with overall normal testing 2 years ago.  I suspect her chest pain may be microvascular angina.  Nevertheless, with continued nitro responsive chest pain need to exclude obstructive CAD.  Will obtain a coronary CTA today.  Patient's GFR is permissible to proceed, patient has been counseled on risk of iodinated contrast in the setting of CKD and patient is willing to proceed. -If CT shows nonobstructive CAD, would recommend initiating long-acting nitrate for the benefit of her blood pressure as well as addressing possible endothelial dysfunction causing chest pain.  Patient and I did discuss alternative testing as an outpatient including PET/CT with myocardial blood flow reserve which would inform on microvascular disease, if chest pain continues this can be pursued by primary cardiologist.  Hyperlipidemia -Last LDL 57 by cardiology notes, continue rosuvastatin 40 mg daily.  For questions or updates, please contact [facility] Please consult [email or web address] for contact info under   Signed, Parke Poisson, MD  07/01/2023 11:58 AM  ADDENDUM: Coronary CTA with mild CAD, nonobstructive. Still query endothelial dysfunction. Will start imdur 15 mg daily for nitroglycerin responsive chest pain and pt can follow up with our outpatient office regarding effectiveness. Caffeine in moderation may mitigate nitroglycerin headache, ok to use tylenol no more than 2-3 g daily prn for headache. Pt under stress with her mother who she cares for and her husband who now has Parkinson's. Discussed graduated exercise program, recommend walking 5 x per week and resistance band exercises for strength training.  D/w Dr. Isidoro Donning, ok to dc home from CV perspective.  F/u 12/6.   Parke Poisson, MD 4:03 PM

## 2023-07-02 DIAGNOSIS — R079 Chest pain, unspecified: Secondary | ICD-10-CM | POA: Diagnosis not present

## 2023-07-02 DIAGNOSIS — R42 Dizziness and giddiness: Secondary | ICD-10-CM | POA: Diagnosis not present

## 2023-07-02 DIAGNOSIS — E782 Mixed hyperlipidemia: Secondary | ICD-10-CM | POA: Diagnosis not present

## 2023-07-02 DIAGNOSIS — E038 Other specified hypothyroidism: Secondary | ICD-10-CM | POA: Diagnosis not present

## 2023-07-02 LAB — BASIC METABOLIC PANEL
Anion gap: 7 (ref 5–15)
BUN: 19 mg/dL (ref 8–23)
CO2: 25 mmol/L (ref 22–32)
Calcium: 8.8 mg/dL — ABNORMAL LOW (ref 8.9–10.3)
Chloride: 108 mmol/L (ref 98–111)
Creatinine, Ser: 1.32 mg/dL — ABNORMAL HIGH (ref 0.44–1.00)
GFR, Estimated: 44 mL/min — ABNORMAL LOW (ref >60)
Glucose, Bld: 111 mg/dL — ABNORMAL HIGH (ref 70–99)
Potassium: 3.9 mmol/L (ref 3.5–5.1)
Sodium: 140 mmol/L (ref 135–145)

## 2023-07-02 LAB — CBC
HCT: 37.1 % (ref 36.0–46.0)
Hemoglobin: 11.9 g/dL — ABNORMAL LOW (ref 12.0–15.0)
MCH: 28.7 pg (ref 26.0–34.0)
MCHC: 32.1 g/dL (ref 30.0–36.0)
MCV: 89.4 fL (ref 80.0–100.0)
Platelets: 204 10*3/uL (ref 150–400)
RBC: 4.15 MIL/uL (ref 3.87–5.11)
RDW: 12.8 % (ref 11.5–15.5)
WBC: 7.6 10*3/uL (ref 4.0–10.5)
nRBC: 0 % (ref 0.0–0.2)

## 2023-07-02 MED ORDER — NITROGLYCERIN 0.4 MG SL SUBL: 0.4000 mg | SUBLINGUAL_TABLET | SUBLINGUAL | 12 refills | Status: DC | PRN

## 2023-07-02 MED ORDER — ONDANSETRON HCL 4 MG PO TABS: 4.0000 mg | ORAL_TABLET | Freq: Three times a day (TID) | ORAL | 0 refills | Status: AC | PRN

## 2023-07-02 MED ORDER — PANTOPRAZOLE SODIUM 40 MG PO TBEC: 40.0000 mg | DELAYED_RELEASE_TABLET | Freq: Every day | ORAL | 3 refills | Status: DC

## 2023-07-02 MED ORDER — ISOSORBIDE MONONITRATE ER 30 MG PO TB24: 15.0000 mg | ORAL_TABLET | Freq: Every day | ORAL | 3 refills | Status: DC

## 2023-07-02 NOTE — Discharge Summary (Signed)
Physician Discharge Summary   Patient: [name] MRN: [id number] DOB: 07-03-1956  Admit date:     06/30/2023  Discharge date: 07/02/23  Discharge Physician: Thad Ranger, MD    PCP: Elias Else, MD (Inactive)   Recommendations at discharge:   Start Imdur 15 mg daily Protonix 40 mg daily  Discharge Diagnoses:    Chest pain, rule out acute myocardial infarction   Hypothyroidism   Migraine   GERD (gastroesophageal reflux disease)   Asthma, chronic   Hyperlipidemia    Hospital Course: Patient is a 67 year old female with hypothyroidism, hyperlipidemia, obesity presented to ED with chest pain.  Patient reported chest pain radiating to left arm, started a day before the admission, associated with shortness of breath, dizziness.  Also reported nausea for last 4 days but no vomiting.  Resting improves her symptoms.  Patient went to see her PCP on the day of admission who referred the patient to ED.  No history of DVT/PE.  Chest pain did improve with nitroglycerin in ED. Patient had seen Dr. Bufford Buttner with [facility] on /03/2022 and had a normal nuclear medicine stress test in 11/2020 and 2D echo with normal LV function in 12/2020 Also reports increasing fatigue for the last several months, sleeping 10 to 12 hours at night and taking naps during the day.  No numbness tingling, asymmetric weakness, visual changes.  Endorses mild headache.  Assessment and Plan:  Chest pain, rule out acute myocardial infarction -Somewhat weak history however still having persistent chest pain 2/10, improved with nitroglycerin in ED. -Currently no acute shortness of breath, nausea vomiting. -Troponins 4-6, D-dimer <0.27 -Cardiology was consulted, patient underwent coronary CTA which showed mild CAD in mid distal LAD and proximal RCA 25 to 49%, coronary calcium score 1, normal coronary origins with right dominance -Patient was started on Imdur 15 mg daily with as needed nitroglycerin. -Patient  scheduled with cardiology outpatient.  Cleared by cardiology to discharge home      Hypothyroidism -Continue Synthroid, TSH 1.8     Migraine -Currently stable, continue Tylenol as needed     GERD (gastroesophageal reflux disease) -Continue Protonix -EGD 2015 was normal, colonoscopy 6/27 showed diverticulosis otherwise normal     Asthma, chronic -Currently no wheezing, stable     Hyperlipidemia -Continue statin   Obesity Estimated body mass index is 35.66 kg/m as calculated from the following:   Height as of this encounter: 5\' 5"  (1.651 m).   Weight as of this encounter: 97.2 kg.      Pain control - [employer] Controlled Substance Reporting System database was reviewed. and patient was instructed, not to drive, operate heavy machinery, perform activities at heights, swimming or participation in water activities or provide baby-sitting services while on Pain, Sleep and Anxiety Medications; until their outpatient Physician has advised to do so again. Also recommended to not to take more than prescribed Pain, Sleep and Anxiety Medications.  Consultants: Cardiology Procedures performed: Coronary CTA Disposition: Home Diet recommendation:  Discharge Diet Orders (From admission, onward)     Start     Ordered   07/02/23 0000  Diet - low sodium heart healthy        07/02/23 0746            DISCHARGE MEDICATION: Allergies as of 07/02/2023       Reactions   Erythromycin    Abd pain   Amoxicillin-pot Clavulanate Nausea And Vomiting   Darvocet [propoxyphene N-acetaminophen] Nausea And Vomiting   Nortriptyline Hcl Other (  See Comments)   Weight gain   Wellbutrin [bupropion] Other (See Comments)   sweating        Medication List     TAKE these medications    albuterol 108 (90 Base) MCG/ACT inhaler Commonly known as: VENTOLIN HFA Inhale 1-2 puffs into the lungs every 6 (six) hours as needed (for bronchospasm).   ascorbic acid 500 MG tablet Commonly known as:  VITAMIN C Take 500 mg by mouth daily.   escitalopram 10 MG tablet Commonly known as: LEXAPRO Take 10 mg by mouth daily.   isosorbide mononitrate 30 MG 24 hr tablet Commonly known as: IMDUR Take 0.5 tablets (15 mg total) by mouth daily.   levothyroxine 75 MCG tablet Commonly known as: SYNTHROID 1 tablet in the morning on an empty stomach   multivitamins ther. w/minerals Tabs tablet Take 1 tablet by mouth daily.   nitroGLYCERIN 0.4 MG SL tablet Commonly known as: NITROSTAT Place 1 tablet (0.4 mg total) under the tongue every 5 (five) minutes as needed for chest pain.   ondansetron 4 MG tablet Commonly known as: Zofran Take 1 tablet (4 mg total) by mouth every 8 (eight) hours as needed for nausea or vomiting.   pantoprazole 40 MG tablet Commonly known as: PROTONIX Take 1 tablet (40 mg total) by mouth daily before breakfast.   polyvinyl alcohol 1.4 % ophthalmic solution Commonly known as: LIQUIFILM TEARS Place 1 drop into both eyes daily as needed for dry eyes.   rosuvastatin 40 MG tablet Commonly known as: CRESTOR Take 40 mg by mouth daily.        Follow-up Information     Elias Else, MD. Schedule an appointment as soon as possible for a visit in 2 week(s).   Specialty: Family Medicine Why: for hospital follow-up Contact information: [address] [place] [address] [phone]         Azalee Course, [place] Follow up on 07/15/2023.   Specialties: Cardiology, Radiology Why: at 9:15 AM, for hospital follow-up Contact information: [address] [place] [address] [phone]                Discharge Exam: Ceasar Mons Weights   06/30/23 1301 06/30/23 2225  Weight: 97.5 kg 97.2 kg   S: Feeling a lot better today, no significant chest pain overnight.  No fevers or chills no nausea vomiting  BP (!) 122/54 (BP Location: Left Arm)   Pulse 61   Temp 98.1 F (36.7 C) (Oral)   Resp 14   Ht 5\' 5"  (1.651 m)   Wt 97.2 kg    SpO2 95%   BMI 35.66 kg/m   Physical Exam General: Alert and oriented x 3, NAD Cardiovascular: S1 S2 clear, RRR.  Respiratory: CTAB, no wheezing, rales or rhonchi Gastrointestinal: Soft, nontender, nondistended, NBS Ext: no pedal edema bilaterally Neuro: no new deficits Psych: Normal affect and demeanor, alert and oriented x3    Condition at discharge: fair  The results of significant diagnostics from this hospitalization (including imaging, microbiology, ancillary and laboratory) are listed below for reference.   Imaging Studies: CT CORONARY MORPH W/CTA COR W/SCORE W/CA W/CM &/OR WO/CM  Addendum Date: 07/01/2023   ADDENDUM REPORT: 07/01/2023 16:20 EXAM: OVER-READ INTERPRETATION  PET-CT CHEST The following report is an over-read performed by radiologist Dr. Leatha Gilding [facility] on 07/01/2023. This over-read does not include interpretation of cardiac or coronary anatomy or pathology. The cardiac CT interpretation by the cardiologist is to be attached. COMPARISON:  None. FINDINGS: No evidence for lymphadenopathy within the visualized mediastinum or hilar regions. The visualized lung parenchyma shows no suspicious pulmonary nodule or mass. No focal airspace consolidation. No effusion. Visualized portions of the upper abdomen are unremarkable. No suspicious lytic or sclerotic osseous abnormality. IMPRESSION: No acute or clinically significant extracardiac findings. Electronically Signed   By: Kennith Center M.D.   On: 07/01/2023 16:20   Addendum Date: 07/01/2023   ADDENDUM REPORT: 07/01/2023 15:26 ADDENDUM: Left circumflex artery: No detectable plaque or stenosis. Electronically Signed   By: Weston Brass M.D.   On: 07/01/2023 15:26   Result Date: 07/01/2023 HISTORY: Chest pain/anginal equiv, ECGs and troponins normal EXAM: Cardiac/Coronary  CT TECHNIQUE: The patient was scanned on a [employer]. PROTOCOL: A 120 kV prospective scan was triggered in the descending  thoracic aorta at 111 HU's. Axial non-contrast 3 mm slices were carried out through the heart. The data set was analyzed on a dedicated work station and scored using the Agatston method. Gantry rotation speed was 250 msecs and collimation was .6 mm. Beta blockade and 0.8 mg of sl NTG was given. The 3D data set was reconstructed in 5% intervals of the 35-75 % of the R-R cycle. Systolic and diastolic phases were analyzed on a dedicated work station using MPR, MIP and VRT modes. The patient received contrast: OMNIPAQUE IOHEXOL 350 MG/ML SOLN. FINDINGS: Image quality: Good Noise artifact is: Limited Coronary calcium score is 1, which places the patient in the 48th percentile for age and sex matched control. Some areas of calcification in the LAD appear under the detection threshold of the scan. Coronary arteries: Normal coronary origins.  Right dominance. Right Coronary Artery: Mild plaque proximal RCA 25-49% stenosis. Large right dominant RCA. Left Main Coronary Artery: No detectable plaque or stenosis. Left Anterior Descending Coronary Artery: Minimal plaque in the proximal-mid LAD, <25% stenosis, which then trifurcates into a large first septal perforator, small continuation of the LAD, and large first diagonal artery. First diagonal artery is grossly normal. Mild plaque in the mid and distal LAD, 25-49% stenosis. Left Circumflex Artery: Aorta: Normal size, 31 mm at the mid ascending aorta (level of the PA bifurcation) measured double oblique. Aortic Valve: No calcifications. Other findings: Normal variant pulmonary vein drainage into the left atrium, common antrum of left sided pulmonary veins. Normal left atrial appendage without thrombus, large chicken wing morphology. Normal size of the pulmonary artery. Please see separate report from [facility] for non-cardiac findings. IMPRESSION: 1. Mild CAD in the mid-distal LAD and proximal RCA, 25-49% stenosis, CADRADS 2. 2. Total plaque volume 46 mm3 which  is 24th percentile for age- and sex-matched controls (calcified plaque 0 mm3; non-calcified plaque 46 mm3). TPV is mild. 3. Coronary calcium score is 1, which places the patient in the 48th percentile for age and sex matched control. 4. Normal coronary origins with right dominance. RECOMMENDATIONS: CAD-RADS 2. Mild non-obstructive CAD (25-49%). Consider non-atherosclerotic causes of chest pain. Consider preventive therapy and risk factor modification. Electronically Signed: By: Weston Brass M.D. On: 07/01/2023 15:23   DG Chest 2 View  Result Date: 06/30/2023 CLINICAL DATA:  Chest pain.  Chest pressure.  Left arm pain. EXAM: CHEST - 2 VIEW COMPARISON:  04/14/2021. FINDINGS: Bilateral lung fields are clear. Elevated right hemidiaphragm noted. Bilateral costophrenic angles are clear. Normal cardio-mediastinal silhouette. No acute osseous abnormalities. The soft tissues are within normal limits. IMPRESSION: *No active cardiopulmonary disease. Electronically Signed   By: Timoteo Expose.D.  On: 06/30/2023 14:48    Microbiology: Results for orders placed or performed during the hospital encounter of 06/30/23  MRSA Next Gen by PCR, Nasal     Status: None   Collection Time: 06/30/23 10:37 PM   Specimen: Nasal Mucosa; Nasal Swab  Result Value Ref Range Status   MRSA by PCR Next Gen NOT DETECTED NOT DETECTED Final    Comment: (NOTE) The GeneXpert MRSA Assay (FDA approved for NASAL specimens only), is one component of a comprehensive MRSA colonization surveillance program. It is not intended to diagnose MRSA infection nor to guide or monitor treatment for MRSA infections. Test performance is not FDA approved in patients less than 87 years old. Performed at [facility], [address], [place], [place] [address]     Labs: CBC: Recent Labs  Lab 06/30/23 1304 07/02/23 0211  WBC 7.4 7.6  HGB 13.5 11.9*  HCT 41.6 37.1  MCV 89.1 89.4  PLT 231 204   Basic Metabolic Panel: Recent Labs   Lab 06/30/23 1304 07/01/23 0804 07/02/23 0211  NA 139 139 140  K 3.8 3.9 3.9  CL 109 107 108  CO2 22 22 25   GLUCOSE 157* 111* 111*  BUN 19 18 19   CREATININE 1.27* 1.09* 1.32*  CALCIUM 9.8 9.0 8.8*   Liver Function Tests: No results for input(s): "AST", "ALT", "ALKPHOS", "BILITOT", "PROT", "ALBUMIN" in the last 168 hours. CBG: No results for input(s): "GLUCAP" in the last 168 hours.  Discharge time spent: greater than 30 minutes.  Signed: Thad Ranger, MD [facility] 07/02/2023

## 2023-07-04 DIAGNOSIS — G4733 Obstructive sleep apnea (adult) (pediatric): Secondary | ICD-10-CM | POA: Diagnosis not present

## 2023-07-04 DIAGNOSIS — G4710 Hypersomnia, unspecified: Secondary | ICD-10-CM | POA: Diagnosis not present

## 2023-07-04 DIAGNOSIS — G4700 Insomnia, unspecified: Secondary | ICD-10-CM | POA: Diagnosis not present

## 2023-07-09 DIAGNOSIS — F419 Anxiety disorder, unspecified: Secondary | ICD-10-CM | POA: Diagnosis not present

## 2023-07-09 DIAGNOSIS — G4710 Hypersomnia, unspecified: Secondary | ICD-10-CM | POA: Diagnosis not present

## 2023-07-09 DIAGNOSIS — R0683 Snoring: Secondary | ICD-10-CM | POA: Diagnosis not present

## 2023-07-09 DIAGNOSIS — F32A Depression, unspecified: Secondary | ICD-10-CM | POA: Diagnosis not present

## 2023-07-09 DIAGNOSIS — G4700 Insomnia, unspecified: Secondary | ICD-10-CM | POA: Diagnosis not present

## 2023-07-09 DIAGNOSIS — G4733 Obstructive sleep apnea (adult) (pediatric): Secondary | ICD-10-CM | POA: Diagnosis not present

## 2023-07-12 DIAGNOSIS — R0789 Other chest pain: Secondary | ICD-10-CM | POA: Diagnosis not present

## 2023-07-12 DIAGNOSIS — K219 Gastro-esophageal reflux disease without esophagitis: Secondary | ICD-10-CM | POA: Diagnosis not present

## 2023-07-12 DIAGNOSIS — F331 Major depressive disorder, recurrent, moderate: Secondary | ICD-10-CM | POA: Diagnosis not present

## 2023-07-15 ENCOUNTER — Encounter: Payer: Self-pay | Admitting: Physician Assistant

## 2023-07-15 ENCOUNTER — Ambulatory Visit: Payer: PPO | Attending: Physician Assistant | Admitting: Physician Assistant

## 2023-07-15 VITALS — BP 118/74 | HR 78 | Ht 65.0 in | Wt 217.0 lb

## 2023-07-15 DIAGNOSIS — R5383 Other fatigue: Secondary | ICD-10-CM

## 2023-07-15 DIAGNOSIS — I25119 Atherosclerotic heart disease of native coronary artery with unspecified angina pectoris: Secondary | ICD-10-CM

## 2023-07-15 DIAGNOSIS — E782 Mixed hyperlipidemia: Secondary | ICD-10-CM

## 2023-07-15 DIAGNOSIS — Z79899 Other long term (current) drug therapy: Secondary | ICD-10-CM

## 2023-07-15 NOTE — Patient Instructions (Signed)
Medication Instructions:  PROVIDER RECOMMENDS TAKING 1000 MG OF OTC FISH OIL TWICE DAILY *If you need a refill on your cardiac medications before your next appointment, please call your pharmacy*   Lab Work: FASTING LIPIDS IN 6 MONTHS If you have labs (blood work) drawn today and your tests are completely normal, you will receive your results only by: MyChart Message (if you have MyChart) OR A paper copy in the mail If you have any lab test that is abnormal or we need to change your treatment, we will call you to review the results.   Testing/Procedures: NO TESTING   Follow-Up: At [facility], you and your health needs are our priority.  As part of our continuing mission to provide you with exceptional heart care, we have created designated Provider Care Teams.  These Care Teams include your primary Cardiologist (physician) and Advanced Practice Providers (APPs -  Physician Assistants and Nurse Practitioners) who all work together to provide you with the care you need, when you need it.  Your next appointment:   6 month(s)  Provider:   Lennie Odor, MD

## 2023-07-15 NOTE — Progress Notes (Unsigned)
Cardiology Office Note:  .   Date:  07/17/2023  ID:  [name], DOB May 21, 1956, MRN [id number] PCP: Wilfrid Lund, [place]   HeartCare Providers Cardiologist:  Reatha Harps, MD     History of Present Illness: .   [name] is a 67 y.o. female with past medical history of hypothyroidism, CKD, OSA and hyperlipidemia.  Myoview and echocardiogram in 2021 were normal.  Patient was recently admitted to the hospital on 06/30/2023 with chest pain.  Coronary CT obtained on 07/01/2023 demonstrated no acute extracardiac finding, 25 to 49% mid to distal LAD lesion and a proximal RCA lesion.  Coronary calcium score was 1 which placed the patient at 48th percentile for age and sex matched control.  Patient was discharged on 15 mg daily of Imdur.  Patient presents today for follow-up.  Since discharge, she occasionally still have some chest discomfort, this does not have direct correlation with the degree of physical exertion.  I reviewed the recent workup.  Her other concern was significant fatigue recently.  She does not have any any anemia, her renal function the electrolyte stable.  Her TSH was normal.  D-dimer was negative.  At this time, I do not recommend any further cardiac workup.  Her Triglyceride has been high, I did ask her to start on OTC fish oil 1000 mg twice a day.  She will need a repeat fasting lipid panel in 6 months prior to her follow-up with Dr. Flora Lipps.  ROS:   She denies palpitations, dyspnea, pnd, orthopnea, n, v, dizziness, syncope, edema, weight gain, or early satiety. All other systems reviewed and are otherwise negative except as noted above.   She has occasional chest pain, does not have direct correlation with exertion.  Studies Reviewed: .        Cardiac Studies & Procedures     STRESS TESTS  MYOCARDIAL PERFUSION IMAGING 12/01/2020  Narrative  Nuclear stress EF: 76%.  Blood pressure demonstrated a normal response to exercise.  There was no ST  segment deviation noted during stress.  The study is normal.  This is a low risk study.  The left ventricular ejection fraction is hyperdynamic (>65%).   ECHOCARDIOGRAM  ECHOCARDIOGRAM COMPLETE 12/25/2020  Narrative ECHOCARDIOGRAM REPORT    Patient Name:   [name] [facility] Date of Exam: 12/25/2020 Medical Rec #:  [id number]        Height:       66.0 in Accession #:    [id number]       Weight:       203.0 lb Date of Birth:  28-Jun-1956        BSA:          2.012 m Patient Age:    65 years         BP:           120/86 mmHg Patient Gender: F                HR:           81 bpm. Exam Location:  [address]  Procedure: 2D Echo, 3D Echo, Cardiac Doppler and Color Doppler  Indications:    R07.9 Chest Pain  History:        Patient has no prior history of Echocardiogram examinations. Signs/Symptoms:Shortness of Breath and Chest Pain; Risk Factors:Family History of Coronary Artery Disease and Dyslipidemia. Asthma, Edema.  Sonographer:    Farrel Conners RDCS Referring Phys: [id number] Ronnald Ramp O'NEAL  IMPRESSIONS  1. Left ventricular ejection fraction, by estimation, is 70 to 75%. Left ventricular ejection fraction by 3D volume is 73 %. The left ventricle has hyperdynamic function. The left ventricle has no regional wall motion abnormalities. There is mild asymmetric left ventricular hypertrophy of the basal-septal segment. Left ventricular diastolic parameters are consistent with Grade I diastolic dysfunction (impaired relaxation). 2. Right ventricular systolic function is normal. The right ventricular size is normal. There is normal pulmonary artery systolic pressure. 3. Left atrial size was mildly dilated. 4. The mitral valve is normal in structure. Trivial mitral valve regurgitation. No evidence of mitral stenosis. 5. The aortic valve is tricuspid. Aortic valve regurgitation is not visualized. No aortic stenosis is present. 6. The inferior vena cava is normal in size with  greater than 50% respiratory variability, suggesting right atrial pressure of 3 mmHg.  Comparison(s): No prior Echocardiogram.  FINDINGS Left Ventricle: Left ventricular ejection fraction, by estimation, is 70 to 75%. Left ventricular ejection fraction by 3D volume is 73 %. The left ventricle has hyperdynamic function. The left ventricle has no regional wall motion abnormalities. The left ventricular internal cavity size was normal in size. There is mild asymmetric left ventricular hypertrophy of the basal-septal segment. Left ventricular diastolic parameters are consistent with Grade I diastolic dysfunction (impaired relaxation).  Right Ventricle: The right ventricular size is normal. No increase in right ventricular wall thickness. Right ventricular systolic function is normal. There is normal pulmonary artery systolic pressure. The tricuspid regurgitant velocity is 1.74 m/s, and with an assumed right atrial pressure of 3 mmHg, the estimated right ventricular systolic pressure is 15.1 mmHg.  Left Atrium: Left atrial size was mildly dilated.  Right Atrium: Right atrial size was normal in size.  Pericardium: There is no evidence of pericardial effusion.  Mitral Valve: The mitral valve is normal in structure. There is mild thickening of the mitral valve leaflet(s). There is mild calcification of the mitral valve leaflet(s). Mild mitral annular calcification. Trivial mitral valve regurgitation. No evidence of mitral valve stenosis.  Tricuspid Valve: The tricuspid valve is normal in structure. Tricuspid valve regurgitation is trivial.  Aortic Valve: The aortic valve is tricuspid. Aortic valve regurgitation is not visualized. No aortic stenosis is present.  Pulmonic Valve: The pulmonic valve was normal in structure. Pulmonic valve regurgitation is trivial.  Aorta: The aortic root and ascending aorta are structurally normal, with no evidence of dilitation.  Venous: The inferior vena cava is  normal in size with greater than 50% respiratory variability, suggesting right atrial pressure of 3 mmHg.  IAS/Shunts: No atrial level shunt detected by color flow Doppler.   LEFT VENTRICLE PLAX 2D LVIDd:         4.50 cm         Diastology LVIDs:         2.30 cm         LV e' medial:    6.20 cm/s LV PW:         1.00 cm         LV E/e' medial:  10.7 LV IVS:        0.80 cm         LV e' lateral:   7.29 cm/s LVOT diam:     2.00 cm         LV E/e' lateral: 9.1 LV SV:         74 LV SV Index:   37 LVOT Area:     3.14 cm  3D Volume EF LV 3D EF:    Left ventricular ejection fraction by 3D volume is 73 %.  3D Volume EF: 3D EF:        73 % LV EDV:       102 ml LV ESV:       27 ml LV SV:        74 ml  RIGHT VENTRICLE RV S prime:     12.40 cm/s TAPSE (M-mode): 1.9 cm  LEFT ATRIUM             Index       RIGHT ATRIUM           Index LA diam:        4.50 cm 2.24 cm/m  RA Area:     14.50 cm LA Vol (A2C):   74.6 ml 37.07 ml/m RA Volume:   34.30 ml  17.04 ml/m LA Vol (A4C):   72.0 ml 35.78 ml/m LA Biplane Vol: 75.0 ml 37.27 ml/m AORTIC VALVE LVOT Vmax:   118.00 cm/s LVOT Vmean:  80.800 cm/s LVOT VTI:    0.235 m  AORTA Ao Root diam: 3.10 cm Ao Asc diam:  3.40 cm  MITRAL VALVE               TRICUSPID VALVE MV Area (PHT): cm         TR Peak grad:   12.1 mmHg MV Decel Time: 251 msec    TR Vmax:        174.00 cm/s MV E velocity: 66.20 cm/s MV A velocity: 76.00 cm/s  SHUNTS MV E/A ratio:  0.87        Systemic VTI:  0.24 m Systemic Diam: 2.00 cm  Laurance Flatten MD Electronically signed by Laurance Flatten MD Signature Date/Time: 12/25/2020/2:05:18 PM    Final     CT SCANS  CT CORONARY MORPH W/CTA COR W/SCORE 07/01/2023  Addendum 07/01/2023  4:22 PM ADDENDUM REPORT: 07/01/2023 16:20  EXAM: OVER-READ INTERPRETATION  PET-CT CHEST  The following report is an over-read performed by radiologist Dr. Leatha Gilding [facility] on 07/01/2023.  This over-read does not include interpretation of cardiac or coronary anatomy or pathology. The cardiac CT interpretation by the cardiologist is to be attached.  COMPARISON:  None.  FINDINGS: No evidence for lymphadenopathy within the visualized mediastinum or hilar regions.  The visualized lung parenchyma shows no suspicious pulmonary nodule or mass. No focal airspace consolidation. No effusion.  Visualized portions of the upper abdomen are unremarkable.  No suspicious lytic or sclerotic osseous abnormality.  IMPRESSION: No acute or clinically significant extracardiac findings.   Electronically Signed By: Kennith Center M.D. On: 07/01/2023 16:20  Addendum 07/01/2023  3:28 PM ADDENDUM REPORT: 07/01/2023 15:26  ADDENDUM: Left circumflex artery: No detectable plaque or stenosis.   Electronically Signed By: Weston Brass M.D. On: 07/01/2023 15:26  Narrative HISTORY: Chest pain/anginal equiv, ECGs and troponins normal  EXAM: Cardiac/Coronary  CT  TECHNIQUE: The patient was scanned on a [employer].  PROTOCOL: A 120 kV prospective scan was triggered in the descending thoracic aorta at 111 HU's. Axial non-contrast 3 mm slices were carried out through the heart. The data set was analyzed on a dedicated work station and scored using the Agatston method. Gantry rotation speed was 250 msecs and collimation was .6 mm. Beta blockade and 0.8 mg of sl NTG was given. The 3D data set was reconstructed in 5% intervals of the 35-75 % of the R-R cycle.  Systolic and diastolic phases were analyzed on a dedicated work station using MPR, MIP and VRT modes. The patient received contrast: OMNIPAQUE IOHEXOL 350 MG/ML SOLN.  FINDINGS: Image quality: Good  Noise artifact is: Limited  Coronary calcium score is 1, which places the patient in the 48th percentile for age and sex matched control. Some areas of calcification in the LAD appear under the detection  threshold of the scan.  Coronary arteries: Normal coronary origins.  Right dominance.  Right Coronary Artery: Mild plaque proximal RCA 25-49% stenosis. Large right dominant RCA.  Left Main Coronary Artery: No detectable plaque or stenosis.  Left Anterior Descending Coronary Artery: Minimal plaque in the proximal-mid LAD, <25% stenosis, which then trifurcates into a large first septal perforator, small continuation of the LAD, and large first diagonal artery. First diagonal artery is grossly normal. Mild plaque in the mid and distal LAD, 25-49% stenosis.  Left Circumflex Artery:  Aorta: Normal size, 31 mm at the mid ascending aorta (level of the PA bifurcation) measured double oblique.  Aortic Valve: No calcifications.  Other findings:  Normal variant pulmonary vein drainage into the left atrium, common antrum of left sided pulmonary veins.  Normal left atrial appendage without thrombus, large chicken wing morphology.  Normal size of the pulmonary artery.  Please see separate report from [facility] for non-cardiac findings.  IMPRESSION: 1. Mild CAD in the mid-distal LAD and proximal RCA, 25-49% stenosis, CADRADS 2.  2. Total plaque volume 46 mm3 which is 24th percentile for age- and sex-matched controls (calcified plaque 0 mm3; non-calcified plaque 46 mm3). TPV is mild.  3. Coronary calcium score is 1, which places the patient in the 48th percentile for age and sex matched control.  4. Normal coronary origins with right dominance.  RECOMMENDATIONS: CAD-RADS 2. Mild non-obstructive CAD (25-49%). Consider non-atherosclerotic causes of chest pain. Consider preventive therapy and risk factor modification.  Electronically Signed: By: Weston Brass M.D. On: 07/01/2023 15:23   CT SCANS  CT CARDIAC SCORING (SELF PAY ONLY) 12/25/2020  Addendum 12/25/2020  1:59 PM ADDENDUM REPORT: 12/25/2020 13:56  CLINICAL DATA:  Cardiovascular Disease Risk  stratification  EXAM: Coronary Calcium Score  TECHNIQUE: A gated, non-contrast computed tomography scan of the heart was performed using 3mm slice thickness. Axial images were analyzed on a dedicated workstation. Calcium scoring of the coronary arteries was performed using the Agatston method.  FINDINGS: Coronary arteries: Normal origins.  Coronary Calcium Score:  Left main: 0  Left anterior descending artery: 0  Left circumflex artery: 0  Right coronary artery: 0  Total: 0  Percentile: 0  Pericardium: Normal.  Ascending Aorta: Normal caliber.  Descending aortic atherosclerosis.  Non-cardiac: See separate report from [facility].  IMPRESSION: Coronary calcium score of 0. This was 0 percentile for age-, race-, and sex-matched controls.  Descending aortic atherosclerosis.  RECOMMENDATIONS: Coronary artery calcium (CAC) score is a strong predictor of incident coronary heart disease (CHD) and provides predictive information beyond traditional risk factors. CAC scoring is reasonable to use in the decision to withhold, postpone, or initiate statin therapy in intermediate-risk or selected borderline-risk asymptomatic adults (age 75-75 years and LDL-C >=70 to <190 mg/dL) who do not have diabetes or established atherosclerotic cardiovascular disease (ASCVD).* In intermediate-risk (10-year ASCVD risk >=7.5% to <20%) adults or selected borderline-risk (10-year ASCVD risk >=5% to <7.5%) adults in whom a CAC score is measured for the purpose of making a treatment decision the following recommendations have been made:  If CAC=0, it is reasonable  to withhold statin therapy and reassess in 5 to 10 years, as long as higher risk conditions are absent (diabetes mellitus, family history of premature CHD in first degree relatives (males <55 years; females <65 years), cigarette smoking, or LDL >=190 mg/dL).  If CAC is 1 to 99, it is reasonable to initiate statin therapy  for patients >=88 years of age.  If CAC is >=100 or >=75th percentile, it is reasonable to initiate statin therapy at any age.  Cardiology referral should be considered for patients with CAC scores >=400 or >=75th percentile.  *2018 AHA/ACC/AACVPR/AAPA/ABC/ACPM/ADA/AGS/APhA/ASPC/NLA/PCNA Guideline on the Management of Blood Cholesterol: A Report of the American College of Cardiology/American Heart Association Task Force on Clinical Practice Guidelines. J Am Coll Cardiol. 2019;73(24):3168-3209.   Electronically Signed By: Donato Schultz MD On: 12/25/2020 13:56  Narrative EXAM: OVER-READ INTERPRETATION  CT CHEST  The following report is an over-read performed by radiologist Dr. Trudie Reed of [facility] on 12/25/2020. This over-read does not include interpretation of cardiac or coronary anatomy or pathology. The coronary calcium score interpretation by the cardiologist is attached.  COMPARISON:  None.  FINDINGS: Aortic atherosclerosis. Within the visualized portions of the thorax there are no suspicious appearing pulmonary nodules or masses, there is no acute consolidative airspace disease, no pleural effusions, no pneumothorax and no lymphadenopathy. Visualized portions of the upper abdomen are unremarkable. There are no aggressive appearing lytic or blastic lesions noted in the visualized portions of the skeleton.  IMPRESSION: 1.  Aortic Atherosclerosis (ICD10-I70.0).  Electronically Signed: By: Trudie Reed M.D. On: 12/25/2020 10:13          Risk Assessment/Calculations:            Physical Exam:   VS:  BP 118/74 (BP Location: Right Arm, Patient Position: Sitting, Cuff Size: Large)   Pulse 78   Ht 5\' 5"  (1.651 m)   Wt 217 lb (98.4 kg)   SpO2 95%   BMI 36.11 kg/m    Wt Readings from Last 3 Encounters:  07/15/23 217 lb (98.4 kg)  06/30/23 214 lb 4.6 oz (97.2 kg)  07/06/22 207 lb (93.9 kg)    GEN: Well nourished, well developed in no  acute distress NECK: No JVD; No carotid bruits CARDIAC: RRR, no murmurs, rubs, gallops RESPIRATORY:  Clear to auscultation without rales, wheezing or rhonchi  ABDOMEN: Soft, non-tender, non-distended EXTREMITIES:  No edema; No deformity   ASSESSMENT AND PLAN: .     Chest Pain Recent hospital admission for chest pain. Coronary CT showed non-obstructive CAD with 25-49% lesions in mid-distal LAD and proximal RCA. Patient reports ongoing episodes of chest pressure, not clearly related to exertion or rest. Troponin and D-dimer were negative during hospital admission. -Continue isosorbide mononitrate for vasodilation and improved blood flow through diseased areas.  Hyperlipidemia Total cholesterol 148, HDL 45, LDL 63, and triglycerides 540. LDL is within goal for patient with known CAD, but triglycerides are elevated. -Start over-the-counter fish oil 1000mg  twice daily. -Repeat fasting lipid panel in 6 months.  Fatigue Patient reports ongoing fatigue for the past 2-3 months. No clear etiology identified from cardiac perspective. Thyroid function, kidney function, electrolytes, and red blood cell count are all normal. -No additional cardiac workup recommended at this time.  Follow-up -Schedule follow-up appointment with cardiology in 6 months after repeat lipid panel.       Dispo: Follow-up in 43-month  Signed, Azalee Course, [place]

## 2023-07-18 DIAGNOSIS — M79642 Pain in left hand: Secondary | ICD-10-CM | POA: Diagnosis not present

## 2023-07-22 DIAGNOSIS — M79642 Pain in left hand: Secondary | ICD-10-CM | POA: Diagnosis not present

## 2023-07-26 DIAGNOSIS — F331 Major depressive disorder, recurrent, moderate: Secondary | ICD-10-CM | POA: Diagnosis not present

## 2023-07-26 DIAGNOSIS — K219 Gastro-esophageal reflux disease without esophagitis: Secondary | ICD-10-CM | POA: Diagnosis not present

## 2023-08-08 DIAGNOSIS — F419 Anxiety disorder, unspecified: Secondary | ICD-10-CM | POA: Diagnosis not present

## 2023-08-08 DIAGNOSIS — G4733 Obstructive sleep apnea (adult) (pediatric): Secondary | ICD-10-CM | POA: Diagnosis not present

## 2023-08-08 DIAGNOSIS — G4710 Hypersomnia, unspecified: Secondary | ICD-10-CM | POA: Diagnosis not present

## 2023-08-08 DIAGNOSIS — G4700 Insomnia, unspecified: Secondary | ICD-10-CM | POA: Diagnosis not present

## 2023-08-08 DIAGNOSIS — R0683 Snoring: Secondary | ICD-10-CM | POA: Diagnosis not present

## 2023-08-08 DIAGNOSIS — F32A Depression, unspecified: Secondary | ICD-10-CM | POA: Diagnosis not present

## 2023-08-23 NOTE — Progress Notes (Signed)
 NEUROLOGY CONSULTATION NOTE  [name] MRN: [id number] DOB: 12-Dec-1955  Referring provider: Barnet Lias, PA-C Primary care provider: Barnet Lias, PA-C  Reason for consult:  headache, dizziness, numbness, weakness  Assessment/Plan:   Migraine with aura, without status migrainosus, not intractable - may be aggravated by uncontrolled blepharospasm   Migraine prevention:  She will be restarting Botox for blepharospasm soon, which will hopefully improve the headaches.  In meantime, will start topiramate  25mg  at bedtime.  We can increase dose in 4 weeks if needed. Migraine rescue:  Tylenol  and Zofran  Limit use of pain relievers to no more than 2 days out of week to prevent risk of rebound or medication-overuse headache. Keep headache diary Check B12 level for memory changes Follow up 6 months.    Subjective:  [name] is a 68 year old right-handed female with asthma, hypothyroidism, CKD, HLD, depression and OSA who presents for headache, dizziness, numbness and weakness.  History supplemented by hospital records, cardiology and referring provider's notes.  I previously saw patient in 2021 in which she had developed a new-onset persistent mild headache with episodes of severe attacks, described as left sided or bilateral holocephalic throbbing or squeezing headache associated with vertigo, left sided neck stiffness, photophobia, phonophobia, generalized weakness and pain and nausea.  MRI and MRA of brain on 04/15/2020 showed mild chronic small vessel ischemic changes but otherwise unremarkable.  She was on nortriptyline  which subsequently discontinued because she experienced weight gain..  She subsequently got neurolens which resolved the migraines.  Would still get neck pain from time to time which she treats with a muscle relaxer.  She has blepharospasm which has been treated with Botox.  Her treating physician left the practice 8 months ago and she hasn't had any Botox since  then.  The blepharospasm has gotten worse and has been uncomfortable.  She is establishing care with a new provider in a couple of days.  6 months ago, she started experiencing the episodes again following several days of extreme lethargy, although denied any illness.  On 06/29/2023, she was seen in the ED for an episode associated with chest pain and SOB.  She was instructed by her PCP's office to go to the hospital where she responded somewhat with NTG.  TSH normal  D-dimer and troponins were negative.  Lipid panel revealed total cholesterol 148, HDL 45, and LDL 63 but triglycerides were elevated.  Underwent cardiac workup.  EKG revealed no acute coronary abnormalities.  Coronary CTA showed mild CAD in the mid distal LAD and proximal RCA 25 to 49% and coronary calcium  score 1.  Advised to start fish oil in addition to her statin.  She also reports feeling fatigue for several months.  PCP's office suspected symptoms may be possible side effects to her antidepressant.  Episodes last 30-60 minutes but may have residual dull headache lasting up to 12 hours.  They are occurring 2-3 times a week.  Since starting medication for GERD, chest pain has resolved.  Also notes some short-term memory problems.  No loss of independence.  Past NSAIDS/analgesics:  cannot take NSAIDs due to CKD Past abortive triptans:  rizatriptan-MLT 10mg  Past abortive ergotamine:  none Past muscle relaxants:  methocarbamol , cyclobenzaprine  10mg  PRN Past anti-emetic:  none Past antihypertensive medications:  none Past antidepressant medications:  nortriptyline  (weight gain), Wellbutrin  Past anticonvulsant medications:  none Past anti-CGRP:  none Past antihistamines/decongestants:  cyproheptadine, Astelin Other past therapies:  none  Current NSAIDS/analgesics:  Tylenol  Current triptans:  none Current  ergotamine:  none Current anti-emetic:  Zofran  8mg  Current muscle relaxants:  none Current Antihypertensive medications:  Imdur   15mg  daily Current Antidepressant medications:  escitalopram  15mg  daily Current Anticonvulsant medications:  none Current anti-CGRP:  none Current Vitamins/Herbal/Supplements:  none Current Antihistamines/Decongestants:  Flonase Other therapy:  none Other medications:  levothyroxine , lorazepam 0.5mg  at bedtime PRN, rosuvastatin  40mg  daily, NTG   PAST MEDICAL HISTORY: Past Medical History:  Diagnosis Date   Acid reflux    Apocrine metaplasia of breast    Asthma    BV (bacterial vaginosis)    Depression    h/o   Endometriosis    h/o   HSV-1 (herpes simplex virus 1) infection    Hyperlipidemia    Hypothyroid    Kidney disease    Obese    Seasonal allergies     PAST SURGICAL HISTORY: Past Surgical History:  Procedure Laterality Date   ABDOMINAL HYSTERECTOMY     CESAREAN SECTION     x3   CHOLECYSTECTOMY N/A 04/11/2021   Procedure: LAPAROSCOPIC CHOLECYSTECTOMY;  Surgeon: Enid Harry, MD;  Location: [facility] OR;  Service: General;  Laterality: N/A;   dental work     cap placed 02-02-16   LEG SURGERY     had non cancerous tumors removed from left leg   SINUS ENDO W/FUSION     x3   TUBAL LIGATION     WISDOM TOOTH EXTRACTION      MEDICATIONS: Current Outpatient Medications on File Prior to Visit  Medication Sig Dispense Refill   albuterol  (VENTOLIN  HFA) 108 (90 Base) MCG/ACT inhaler Inhale 1-2 puffs into the lungs every 6 (six) hours as needed (for bronchospasm).     ascorbic acid (VITAMIN C) 500 MG tablet Take 500 mg by mouth daily.     escitalopram  (LEXAPRO ) 10 MG tablet Take 10 mg by mouth daily.     isosorbide  mononitrate (IMDUR ) 30 MG 24 hr tablet Take 0.5 tablets (15 mg total) by mouth daily. 30 tablet 3   levothyroxine  (SYNTHROID ) 75 MCG tablet 1 tablet in the morning on an empty stomach     Multiple Vitamins-Minerals (MULTIVITAMINS THER. W/MINERALS) TABS Take 1 tablet by mouth daily.     nitroGLYCERIN  (NITROSTAT ) 0.4 MG SL tablet Place 1 tablet (0.4 mg total) under  the tongue every 5 (five) minutes as needed for chest pain. 30 tablet 12   ondansetron  (ZOFRAN ) 4 MG tablet Take 1 tablet (4 mg total) by mouth every 8 (eight) hours as needed for nausea or vomiting. 20 tablet 0   pantoprazole  (PROTONIX ) 40 MG tablet Take 1 tablet (40 mg total) by mouth daily before breakfast. 30 tablet 3   polyvinyl alcohol  (LIQUIFILM TEARS) 1.4 % ophthalmic solution Place 1 drop into both eyes daily as needed for dry eyes.     rosuvastatin  (CRESTOR ) 40 MG tablet Take 40 mg by mouth daily.     No current facility-administered medications on file prior to visit.    ALLERGIES: Allergies  Allergen Reactions   Erythromycin     Abd pain   Amoxicillin-Pot Clavulanate Nausea And Vomiting   Darvocet [Propoxyphene N-Acetaminophen ] Nausea And Vomiting   Nortriptyline  Hcl Other (See Comments)    Weight gain   Wellbutrin  [Bupropion ] Other (See Comments)    sweating    FAMILY HISTORY: Family History  Problem Relation Age of Onset   Cancer Maternal Uncle    Emphysema Maternal Uncle    Stroke Other    Asthma Mother    Mitral valve prolapse Mother  Thyroid  disease Mother    Heart disease Brother    Mitral valve prolapse Brother    Cancer Maternal Aunt        uterine and liver and kidney   Diabetes Maternal Grandmother    Heart disease Maternal Grandmother    Thyroid  disease Maternal Grandmother    Diabetes Paternal Grandmother    Diabetes Paternal Grandfather    Colon cancer Paternal Uncle    Stomach cancer Neg Hx    Rectal cancer Neg Hx     Objective:  Blood pressure 131/63, pulse 72, height 5\' 5"  (1.651 m), weight 220 lb (99.8 kg), SpO2 96%. General: No acute distress.  Patient appears well-groomed.   Head:  Normocephalic/atraumatic Eyes:  fundi examined but not visualized Neck: supple, no paraspinal tenderness, full range of motion Heart: regular rate and rhythm Neurological Exam: Mental status: alert and oriented to person, place, and time, speech fluent  and not dysarthric, language intact. Cranial nerves: CN I: not tested CN II: pupils equal, round and reactive to light, visual fields intact CN III, IV, VI:  full range of motion, no nystagmus, no ptosis CN V: facial sensation intact. CN VII: upper and lower face symmetric CN VIII: hearing intact CN IX, X: gag intact, uvula midline CN XI: sternocleidomastoid and trapezius muscles intact CN XII: tongue midline Bulk & Tone: normal, no fasciculations. Motor:  muscle strength 5/5 throughout Sensation:  mild reduce pinprick sensation in toes of left foot, temperature and vibratory sensation intact. Deep Tendon Reflexes:  2+ throughout,  toes downgoing.   Finger to nose testing:  Without dysmetria.   Gait:  Normal station and stride.  Romberg negative.    Thank you for allowing me to take part in the care of this patient.  Janne Members, DO  CC: Barnet Lias, PA-C

## 2023-08-24 ENCOUNTER — Encounter: Payer: Self-pay | Admitting: Neurology

## 2023-08-24 ENCOUNTER — Other Ambulatory Visit: Payer: PPO

## 2023-08-24 ENCOUNTER — Ambulatory Visit: Payer: PPO | Admitting: Neurology

## 2023-08-24 VITALS — BP 131/63 | HR 72 | Ht 65.0 in | Wt 220.0 lb

## 2023-08-24 DIAGNOSIS — G43109 Migraine with aura, not intractable, without status migrainosus: Secondary | ICD-10-CM

## 2023-08-24 DIAGNOSIS — E538 Deficiency of other specified B group vitamins: Secondary | ICD-10-CM

## 2023-08-24 MED ORDER — TOPIRAMATE 25 MG PO TABS: 25.0000 mg | ORAL_TABLET | Freq: Every day | ORAL | 5 refills | Status: DC

## 2023-08-24 NOTE — Patient Instructions (Addendum)
 Headaches and dizziness are likely migraines, aggravated by worsening blepharospasm.  Hopefully they will return with restarting of Botox  In meantime, start topiramate  25mg  at bedtime.  We can increase dose in 4 weeks if needed. 2.  Take Tylenol  and anti-nausea medication for migraine attacks.  Limit use of pain relievers to no more than 2 days out of week to prevent risk of rebound or medication-overuse headache. 3.  Limit use of pain relievers to no more than 2 days out of week to prevent risk of rebound or medication-overuse headache. 4.  Keep headache diary 5. Check B12 levels

## 2023-08-25 LAB — VITAMIN B12: Vitamin B-12: 781 pg/mL (ref 200–1100)

## 2023-09-11 ENCOUNTER — Encounter: Payer: Self-pay | Admitting: Neurology

## 2023-10-03 DIAGNOSIS — G4733 Obstructive sleep apnea (adult) (pediatric): Secondary | ICD-10-CM | POA: Diagnosis not present

## 2023-10-03 DIAGNOSIS — G4700 Insomnia, unspecified: Secondary | ICD-10-CM | POA: Diagnosis not present

## 2023-10-03 DIAGNOSIS — G4710 Hypersomnia, unspecified: Secondary | ICD-10-CM | POA: Diagnosis not present

## 2023-10-13 ENCOUNTER — Encounter: Payer: Self-pay | Admitting: Neurology

## 2023-10-13 DIAGNOSIS — G245 Blepharospasm: Secondary | ICD-10-CM | POA: Diagnosis not present

## 2023-10-14 ENCOUNTER — Other Ambulatory Visit: Payer: Self-pay | Admitting: Neurology

## 2023-10-14 MED ORDER — TOPIRAMATE ER 50 MG PO CAP24: 1.0000 | ORAL_CAPSULE | Freq: Every day | ORAL | 5 refills | Status: DC

## 2023-10-17 ENCOUNTER — Other Ambulatory Visit: Payer: Self-pay | Admitting: Neurology

## 2023-10-17 MED ORDER — TOPIRAMATE 50 MG PO TABS: 50.0000 mg | ORAL_TABLET | Freq: Every day | ORAL | 5 refills | Status: DC

## 2023-10-17 NOTE — Telephone Encounter (Signed)
 Patient advised of Dr.Jaffe note,   Her insurance doesn't cover the generic or brand name extended release topiramate.  If she really wants to take the extended release, she may want to look into the assistance program for [place].   Patient would like to do the topiramate 50 mg and see how she do.   Per patient she has a headache every day and it is starting to get worse

## 2023-10-20 DIAGNOSIS — N1832 Chronic kidney disease, stage 3b: Secondary | ICD-10-CM | POA: Diagnosis not present

## 2023-10-20 DIAGNOSIS — E669 Obesity, unspecified: Secondary | ICD-10-CM | POA: Diagnosis not present

## 2023-10-20 DIAGNOSIS — G4733 Obstructive sleep apnea (adult) (pediatric): Secondary | ICD-10-CM | POA: Diagnosis not present

## 2023-10-20 DIAGNOSIS — E039 Hypothyroidism, unspecified: Secondary | ICD-10-CM | POA: Diagnosis not present

## 2023-10-20 DIAGNOSIS — I700 Atherosclerosis of aorta: Secondary | ICD-10-CM | POA: Diagnosis not present

## 2023-10-20 DIAGNOSIS — Z6835 Body mass index (BMI) 35.0-35.9, adult: Secondary | ICD-10-CM | POA: Diagnosis not present

## 2023-10-31 ENCOUNTER — Encounter: Payer: Self-pay | Admitting: Cardiovascular Disease

## 2023-11-01 DIAGNOSIS — G4700 Insomnia, unspecified: Secondary | ICD-10-CM | POA: Diagnosis not present

## 2023-11-01 DIAGNOSIS — N1832 Chronic kidney disease, stage 3b: Secondary | ICD-10-CM | POA: Diagnosis not present

## 2023-11-01 DIAGNOSIS — K219 Gastro-esophageal reflux disease without esophagitis: Secondary | ICD-10-CM | POA: Diagnosis not present

## 2023-11-01 DIAGNOSIS — A6000 Herpesviral infection of urogenital system, unspecified: Secondary | ICD-10-CM | POA: Diagnosis not present

## 2023-11-01 DIAGNOSIS — E785 Hyperlipidemia, unspecified: Secondary | ICD-10-CM | POA: Diagnosis not present

## 2023-11-01 DIAGNOSIS — G43809 Other migraine, not intractable, without status migrainosus: Secondary | ICD-10-CM | POA: Diagnosis not present

## 2023-11-01 DIAGNOSIS — F419 Anxiety disorder, unspecified: Secondary | ICD-10-CM | POA: Diagnosis not present

## 2023-11-01 DIAGNOSIS — E039 Hypothyroidism, unspecified: Secondary | ICD-10-CM | POA: Diagnosis not present

## 2023-11-01 DIAGNOSIS — J301 Allergic rhinitis due to pollen: Secondary | ICD-10-CM | POA: Diagnosis not present

## 2023-11-01 DIAGNOSIS — G4733 Obstructive sleep apnea (adult) (pediatric): Secondary | ICD-10-CM | POA: Diagnosis not present

## 2023-11-01 DIAGNOSIS — E2839 Other primary ovarian failure: Secondary | ICD-10-CM | POA: Diagnosis not present

## 2023-11-01 DIAGNOSIS — F324 Major depressive disorder, single episode, in partial remission: Secondary | ICD-10-CM | POA: Diagnosis not present

## 2023-11-02 DIAGNOSIS — G4733 Obstructive sleep apnea (adult) (pediatric): Secondary | ICD-10-CM | POA: Diagnosis not present

## 2023-11-02 DIAGNOSIS — Z6835 Body mass index (BMI) 35.0-35.9, adult: Secondary | ICD-10-CM | POA: Diagnosis not present

## 2023-11-02 DIAGNOSIS — E669 Obesity, unspecified: Secondary | ICD-10-CM | POA: Diagnosis not present

## 2023-11-02 DIAGNOSIS — N1832 Chronic kidney disease, stage 3b: Secondary | ICD-10-CM | POA: Diagnosis not present

## 2023-11-02 DIAGNOSIS — E039 Hypothyroidism, unspecified: Secondary | ICD-10-CM | POA: Diagnosis not present

## 2023-11-02 DIAGNOSIS — I700 Atherosclerosis of aorta: Secondary | ICD-10-CM | POA: Diagnosis not present

## 2023-11-04 DIAGNOSIS — R739 Hyperglycemia, unspecified: Secondary | ICD-10-CM | POA: Diagnosis not present

## 2023-11-09 DIAGNOSIS — D225 Melanocytic nevi of trunk: Secondary | ICD-10-CM | POA: Diagnosis not present

## 2023-11-09 DIAGNOSIS — Z1283 Encounter for screening for malignant neoplasm of skin: Secondary | ICD-10-CM | POA: Diagnosis not present

## 2023-11-09 DIAGNOSIS — L821 Other seborrheic keratosis: Secondary | ICD-10-CM | POA: Diagnosis not present

## 2023-11-09 DIAGNOSIS — B078 Other viral warts: Secondary | ICD-10-CM | POA: Diagnosis not present

## 2023-12-21 DIAGNOSIS — E669 Obesity, unspecified: Secondary | ICD-10-CM | POA: Diagnosis not present

## 2023-12-21 DIAGNOSIS — Z6834 Body mass index (BMI) 34.0-34.9, adult: Secondary | ICD-10-CM | POA: Diagnosis not present

## 2023-12-21 DIAGNOSIS — I700 Atherosclerosis of aorta: Secondary | ICD-10-CM | POA: Diagnosis not present

## 2023-12-21 DIAGNOSIS — R7303 Prediabetes: Secondary | ICD-10-CM | POA: Diagnosis not present

## 2023-12-21 DIAGNOSIS — G4733 Obstructive sleep apnea (adult) (pediatric): Secondary | ICD-10-CM | POA: Diagnosis not present

## 2023-12-21 DIAGNOSIS — N1832 Chronic kidney disease, stage 3b: Secondary | ICD-10-CM | POA: Diagnosis not present

## 2023-12-21 DIAGNOSIS — E039 Hypothyroidism, unspecified: Secondary | ICD-10-CM | POA: Diagnosis not present

## 2023-12-22 DIAGNOSIS — G245 Blepharospasm: Secondary | ICD-10-CM | POA: Diagnosis not present

## 2024-01-04 DIAGNOSIS — G4710 Hypersomnia, unspecified: Secondary | ICD-10-CM | POA: Diagnosis not present

## 2024-01-04 DIAGNOSIS — G4700 Insomnia, unspecified: Secondary | ICD-10-CM | POA: Diagnosis not present

## 2024-01-04 DIAGNOSIS — G4733 Obstructive sleep apnea (adult) (pediatric): Secondary | ICD-10-CM | POA: Diagnosis not present

## 2024-01-12 DIAGNOSIS — R7309 Other abnormal glucose: Secondary | ICD-10-CM | POA: Diagnosis not present

## 2024-01-19 DIAGNOSIS — G4733 Obstructive sleep apnea (adult) (pediatric): Secondary | ICD-10-CM | POA: Diagnosis not present

## 2024-01-19 DIAGNOSIS — N1832 Chronic kidney disease, stage 3b: Secondary | ICD-10-CM | POA: Diagnosis not present

## 2024-01-19 DIAGNOSIS — I700 Atherosclerosis of aorta: Secondary | ICD-10-CM | POA: Diagnosis not present

## 2024-01-19 DIAGNOSIS — Z6834 Body mass index (BMI) 34.0-34.9, adult: Secondary | ICD-10-CM | POA: Diagnosis not present

## 2024-01-19 DIAGNOSIS — R7303 Prediabetes: Secondary | ICD-10-CM | POA: Diagnosis not present

## 2024-01-19 DIAGNOSIS — E039 Hypothyroidism, unspecified: Secondary | ICD-10-CM | POA: Diagnosis not present

## 2024-01-19 DIAGNOSIS — E669 Obesity, unspecified: Secondary | ICD-10-CM | POA: Diagnosis not present

## 2024-02-07 DIAGNOSIS — N1832 Chronic kidney disease, stage 3b: Secondary | ICD-10-CM | POA: Diagnosis not present

## 2024-02-07 DIAGNOSIS — N1831 Chronic kidney disease, stage 3a: Secondary | ICD-10-CM | POA: Diagnosis not present

## 2024-02-20 NOTE — Progress Notes (Unsigned)
 NEUROLOGY FOLLOW UP OFFICE NOTE  MYKAILA BLUNCK [id number]  Assessment/Plan:   Migraine with aura, without status migrainosus, not intractable Cervicogenic/tension-type headache Vertigo - these current episodes seem more consistent with BPPV   For treatment of neck pain/cervicogenic headache and vertigo, refer to physical therapy for neck stretches and vestibular rehab.  If no improvement, consider increasing topiramate . Migraine prevention:  Topiramate  50mg  at bedtime. Migraine rescue:  Tylenol  and Zofran . Limit use of pain relievers to no more than 9 days out of the month to prevent risk of rebound or medication-overuse headache. Keep headache diary Follow up 8 months     Subjective:  KESA BIRKY is a 68 year old right-handed female with asthma, hypothyroidism, CKD, HLD, depression and OSA who follows up for migraine.  UPDATE: B12 from 08/24/2023 was 781. Back on Botox for blepharospasm.   Started topiramate . Migraines are overall improved.  She hasn't had one of her habitual migraines.  She has a constant pressure on the head, like she is wearing a cap.  This started 2 to 3 months ago.  She doesn't typically treat this.  When it worsens, she gets an ice pick pain in the upper cervical mid-spinal region.    For the past several months, she has been experiencing vertigo but no associated headache.  If she lays down on her back, she will experience spinning.  She has to turn to her side and it will settle down after a few minutes.  It occurs every 2 to 3 days.  Current NSAIDS/analgesics:  Tylenol  Current triptans:  none Current ergotamine:  none Current anti-emetic:  Zofran  8mg  Current muscle relaxants:  cyclobenzaprine  10mg  PRN Current Antihypertensive medications:  Imdur  15mg  daily Current Antidepressant medications:  escitalopram  15mg  daily Current Anticonvulsant medications:  topiramate  50mg  daily (insurance would not approve ER). Current anti-CGRP:   none Current Vitamins/Herbal/Supplements:  none Current Antihistamines/Decongestants:  Flonase Other therapy:  none.  However, she is receiving Botox for blepharospasm. Other medications:  levothyroxine , metformin, lorazepam 0.5mg  at bedtime PRN, rosuvastatin  40mg  daily, NTG  Caffeine:  very little (decaf tea).  No coffee Diet:  Needs to increase water; decaf soft drinks; trying to improve diet (limiting processed/pre-packaged foods).     Exercise:  She has more energy now and exercising. Depression:  stable; Anxiety:  yes.  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 Sleep hygiene:  good.  On CPAP  HISTORY: I previously saw patient in 2021 in which she had developed a new-onset persistent mild headache with episodes of severe attacks, described as left sided or bilateral holocephalic throbbing or squeezing headache associated with vertigo, left sided neck stiffness, photophobia, phonophobia, generalized weakness and pain and nausea.  MRI and MRA of brain on 04/15/2020 showed mild chronic small vessel ischemic changes but otherwise unremarkable.  She was on nortriptyline  which subsequently discontinued because she experienced weight gain..  She subsequently got neurolens which resolved the migraines.  Would still get neck pain from time to time which she treats with a muscle relaxer.  She has blepharospasm which has been treated with Botox.  Her treating physician left the practice 8 months ago and she hasn't had any Botox since then.  The blepharospasm has gotten worse and has been uncomfortable.  She is establishing care with a new provider in a couple of days.  6 months ago, she started experiencing the episodes again following several days of extreme lethargy, although denied any illness.  On  06/29/2023, she was seen in the ED for an episode associated with chest pain and SOB.  She was instructed by her PCP's office to go to the hospital where she  responded somewhat with NTG.  TSH normal  D-dimer and troponins were negative.  Lipid panel revealed total cholesterol 148, HDL 45, and LDL 63 but triglycerides were elevated.  Underwent cardiac workup.  EKG revealed no acute coronary abnormalities.  Coronary CTA showed mild CAD in the mid distal LAD and proximal RCA 25 to 49% and coronary calcium  score 1.  Advised to start fish oil in addition to her statin.  She also reports feeling fatigue for several months.  PCP's office suspected symptoms may be possible side effects to her antidepressant.  Episodes last 30-60 minutes but may have residual dull headache lasting up to 12 hours.  They are occurring 2-3 times a week.  Since starting medication for GERD, chest pain has resolved.  Also notes some short-term memory problems.  No loss of independence.  Past NSAIDS/analgesics:  cannot take NSAIDs due to CKD Past abortive triptans:  rizatriptan-MLT 10mg  Past abortive ergotamine:  none Past muscle relaxants:  methocarbamol , cyclobenzaprine  10mg  PRN Past anti-emetic:  none Past antihypertensive medications:  none Past antidepressant medications:  nortriptyline  (weight gain), Wellbutrin  Past anticonvulsant medications:  none Past anti-CGRP:  none Past antihistamines/decongestants:  cyproheptadine, Astelin Other past therapies:  none    PAST MEDICAL HISTORY: Past Medical History:  Diagnosis Date   Acid reflux    Apocrine metaplasia of breast    Asthma    BV (bacterial vaginosis)    Depression    h/o   Endometriosis    h/o   HSV-1 (herpes simplex virus 1) infection    Hyperlipidemia    Hypothyroid    Kidney disease    Obese    Seasonal allergies     MEDICATIONS: Current Outpatient Medications on File Prior to Visit  Medication Sig Dispense Refill   albuterol  (VENTOLIN  HFA) 108 (90 Base) MCG/ACT inhaler Inhale 1-2 puffs into the lungs every 6 (six) hours as needed (for bronchospasm).     ascorbic acid (VITAMIN C) 500 MG tablet Take  500 mg by mouth daily.     estradiol  (ESTRACE ) 0.1 MG/GM vaginal cream Place 1 Applicatorful vaginally daily.     famotidine  (PEPCID ) 40 MG tablet Take 40 mg by mouth at bedtime.     isosorbide  mononitrate (IMDUR ) 30 MG 24 hr tablet Take 0.5 tablets (15 mg total) by mouth daily. 30 tablet 3   levothyroxine  (SYNTHROID ) 75 MCG tablet 1 tablet in the morning on an empty stomach     Multiple Vitamins-Minerals (MULTIVITAMINS THER. W/MINERALS) TABS Take 1 tablet by mouth daily.     nitroGLYCERIN  (NITROSTAT ) 0.4 MG SL tablet Place 1 tablet (0.4 mg total) under the tongue every 5 (five) minutes as needed for chest pain. 30 tablet 12   ondansetron  (ZOFRAN ) 4 MG tablet Take 1 tablet (4 mg total) by mouth every 8 (eight) hours as needed for nausea or vomiting. 20 tablet 0   pantoprazole  (PROTONIX ) 40 MG tablet Take 1 tablet (40 mg total) by mouth daily before breakfast. 30 tablet 3   polyvinyl alcohol  (LIQUIFILM TEARS) 1.4 % ophthalmic solution Place 1 drop into both eyes daily as needed for dry eyes.     rosuvastatin  (CRESTOR ) 40 MG tablet Take 40 mg by mouth daily.     saccharomyces boulardii (FLORASTOR) 250 MG capsule Take 250 mg by mouth 2 (two) times daily.  topiramate  (TOPAMAX ) 50 MG tablet Take 1 tablet (50 mg total) by mouth at bedtime. 30 tablet 5   No current facility-administered medications on file prior to visit.    ALLERGIES: Allergies  Allergen Reactions   Erythromycin     Abd pain   Amoxicillin-Pot Clavulanate Nausea And Vomiting   Darvocet [Propoxyphene N-Acetaminophen ] Nausea And Vomiting   Nortriptyline  Hcl Other (See Comments)    Weight gain   Wellbutrin  [Bupropion ] Other (See Comments)    sweating    FAMILY HISTORY: Family History  Problem Relation Age of Onset   Asthma Mother    Mitral valve prolapse Mother    Thyroid  disease Mother    Neuropathy Father    Heart disease Brother    Mitral valve prolapse Brother    Cancer Maternal Aunt        uterine and liver and  kidney   Stroke Maternal Uncle    Neuropathy Maternal Uncle    Cancer Maternal Uncle    Emphysema Maternal Uncle    Colon cancer Paternal Uncle    Stroke Maternal Grandmother    Diabetes Maternal Grandmother    Heart disease Maternal Grandmother    Thyroid  disease Maternal Grandmother    Diabetes Paternal Grandmother    Diabetes Paternal Grandfather    Stomach cancer Neg Hx    Rectal cancer Neg Hx       Objective:  Blood pressure 117/68, pulse 80, height 5' 6 (1.676 m), weight 207 lb (93.9 kg), SpO2 98%. General: No acute distress.  Patient appears well-groomed.     Juliene Dunnings, DO  CC: Therisa Holm, PA

## 2024-02-21 ENCOUNTER — Encounter: Payer: Self-pay | Admitting: Neurology

## 2024-02-21 ENCOUNTER — Ambulatory Visit: Payer: PPO | Admitting: Neurology

## 2024-02-21 VITALS — BP 117/68 | HR 80 | Ht 66.0 in | Wt 207.0 lb

## 2024-02-21 DIAGNOSIS — M542 Cervicalgia: Secondary | ICD-10-CM

## 2024-02-21 DIAGNOSIS — R42 Dizziness and giddiness: Secondary | ICD-10-CM

## 2024-02-21 DIAGNOSIS — G43109 Migraine with aura, not intractable, without status migrainosus: Secondary | ICD-10-CM

## 2024-02-21 NOTE — Patient Instructions (Signed)
 Continue topiramate  50mg  at bedtime Refer to physical therapy for both neck pain (which may be contributing to the headache) and for vestibular rehab (to treat the vertigo).  If no improvement after treatment ends, contact me and we can perhaps try increasing topiramate  Limit use of pain relievers to no more than 9 days out of the month to prevent risk of rebound or medication-overuse headache. Keep headache diary

## 2024-02-22 DIAGNOSIS — R7303 Prediabetes: Secondary | ICD-10-CM | POA: Diagnosis not present

## 2024-02-22 DIAGNOSIS — G4733 Obstructive sleep apnea (adult) (pediatric): Secondary | ICD-10-CM | POA: Diagnosis not present

## 2024-02-22 DIAGNOSIS — Z6833 Body mass index (BMI) 33.0-33.9, adult: Secondary | ICD-10-CM | POA: Diagnosis not present

## 2024-02-22 DIAGNOSIS — E039 Hypothyroidism, unspecified: Secondary | ICD-10-CM | POA: Diagnosis not present

## 2024-02-22 DIAGNOSIS — E669 Obesity, unspecified: Secondary | ICD-10-CM | POA: Diagnosis not present

## 2024-02-22 DIAGNOSIS — N1832 Chronic kidney disease, stage 3b: Secondary | ICD-10-CM | POA: Diagnosis not present

## 2024-02-22 DIAGNOSIS — I700 Atherosclerosis of aorta: Secondary | ICD-10-CM | POA: Diagnosis not present

## 2024-03-07 DIAGNOSIS — N1832 Chronic kidney disease, stage 3b: Secondary | ICD-10-CM | POA: Diagnosis not present

## 2024-03-07 DIAGNOSIS — N1831 Chronic kidney disease, stage 3a: Secondary | ICD-10-CM | POA: Diagnosis not present

## 2024-03-08 DIAGNOSIS — E039 Hypothyroidism, unspecified: Secondary | ICD-10-CM | POA: Diagnosis not present

## 2024-03-08 DIAGNOSIS — N1831 Chronic kidney disease, stage 3a: Secondary | ICD-10-CM | POA: Diagnosis not present

## 2024-03-08 DIAGNOSIS — F331 Major depressive disorder, recurrent, moderate: Secondary | ICD-10-CM | POA: Diagnosis not present

## 2024-03-08 DIAGNOSIS — N1832 Chronic kidney disease, stage 3b: Secondary | ICD-10-CM | POA: Diagnosis not present

## 2024-03-14 ENCOUNTER — Ambulatory Visit: Attending: Neurology | Admitting: Physical Therapy

## 2024-03-14 ENCOUNTER — Other Ambulatory Visit: Payer: Self-pay

## 2024-03-14 ENCOUNTER — Encounter: Payer: Self-pay | Admitting: Physical Therapy

## 2024-03-14 VITALS — BP 144/72 | HR 63

## 2024-03-14 DIAGNOSIS — R2681 Unsteadiness on feet: Secondary | ICD-10-CM | POA: Insufficient documentation

## 2024-03-14 DIAGNOSIS — M542 Cervicalgia: Secondary | ICD-10-CM | POA: Insufficient documentation

## 2024-03-14 DIAGNOSIS — R42 Dizziness and giddiness: Secondary | ICD-10-CM | POA: Diagnosis not present

## 2024-03-14 NOTE — Therapy (Unsigned)
 OUTPATIENT PHYSICAL THERAPY VESTIBULAR EVALUATION     Patient Name: [name] MRN: [id number] DOB:1955-11-12, 68 y.o., female Today's Date: 03/14/2024  END OF SESSION:  PT End of Session - 03/14/24 1206     Visit Number 1    Number of Visits 9   8 + eval   Date for PT Re-Evaluation 05/25/24   pushed out due to potential scheduling delay   Authorization Type HTA    Progress Note Due on Visit 10    PT Start Time 1155    PT Stop Time 1238    PT Time Calculation (min) 43 min    Equipment Utilized During Treatment Gait belt    Activity Tolerance Patient tolerated treatment well    Behavior During Therapy WFL for tasks assessed/performed          Past Medical History:  Diagnosis Date   Acid reflux    Apocrine metaplasia of breast    Asthma    BV (bacterial vaginosis)    Depression    h/o   Endometriosis    h/o   HSV-1 (herpes simplex virus 1) infection    Hyperlipidemia    Hypothyroid    Kidney disease    Obese    Seasonal allergies    Past Surgical History:  Procedure Laterality Date   ABDOMINAL HYSTERECTOMY     CESAREAN SECTION     x3   CHOLECYSTECTOMY N/A 04/11/2021   Procedure: LAPAROSCOPIC CHOLECYSTECTOMY;  Surgeon: Ebbie Cough, MD;  Location: [facility] OR;  Service: General;  Laterality: N/A;   dental work     cap placed 02-02-16   LEG SURGERY     had non cancerous tumors removed from left leg   SINUS ENDO W/FUSION     x3   TUBAL LIGATION     WISDOM TOOTH EXTRACTION     Patient Active Problem List   Diagnosis Date Noted   Hyperlipidemia 07/01/2023   Chest pain, rule out acute myocardial infarction 06/30/2023   Migraine 06/30/2023   Diarrhea 07/06/2022   Status post cholecystectomy 04/13/2021   Acute cholecystitis 04/10/2021   RUQ abdominal pain 05/14/2014   Abdominal pain, epigastric 05/14/2014   Nausea without vomiting 05/14/2014   Hypothyroidism 05/14/2014   GERD (gastroesophageal reflux disease) 05/14/2014   Asthma, chronic 05/14/2014     PCP: Alben Therisa MATSU, PA REFERRING PROVIDER: Skeet Juliene SAUNDERS, DO  REFERRING DIAG: M54.2 (ICD-10-CM) - Cervicalgia  THERAPY DIAG:  Cervicalgia  Dizziness and giddiness  Unsteadiness on feet  ONSET DATE: 1 year  Rationale for Evaluation and Treatment: Rehabilitation  SUBJECTIVE:   SUBJECTIVE STATEMENT: [name]  She has had severe migraines for years but over the last year she noticed they have been different.  Before she had to lay completely still and no light, no sound, just quiet darkness and she would go to bed early.  These made her feeling like the room was spinning for hours.  She would be nauseous.  Now they last 2-3 hours and she just feels like she is the one spinning not the room.  She is not getting the headache component as strongly now, but her head feels more involved than her body.  When she lays on her back sometimes she gets these waves of intense spinning and she will wait for a bit then turn onto her right side and this usually resolves it.  She can still go to sleep and usually will wake up feeling fine.  Sometimes traffic passing her will bother  her.  She has previously had prism  glasses that helped some with her previous migraines, but she did not splurge on the expense for her newest pair.  She is currently not taking any medication for the spinning.  I've felt like I've always carried my stress in my neck.  I was told I had cervical spondylosis 30-40 years ago.  I just feel really tight.  I have gone the chiropractor route and massages and they temporarily help some.  Pain is worse on left side than right.  Her neck pain will radiate when she has migraines most of but not all of the time.  Pt accompanied by: self (she is still able to drive herself)  PERTINENT HISTORY: GERD, hypothyroidism, kidney disease, depression, blepharospasm  PAIN:  Are you having pain? Yes: NPRS scale: 3-4 Pain location: left upper trap region Pain description: tight, caplike  pressure from back of neck to top of head Aggravating factors: very loud or very bright lights Relieving factors: heat  PRECAUTIONS: Fall  RED FLAGS: None   WEIGHT BEARING RESTRICTIONS: No  FALLS: Has patient fallen in last 6 months? No  LIVING ENVIRONMENT: Lives with: lives with their spouse Lives in: House/apartment Stairs: Yes: Internal: 15 steps; can reach both and chair lift and External: 15 steps; can reach both Has following equipment at home: Single point cane, Walker - 2 wheeled, Environmental consultant - 4 wheeled, Wheelchair (manual), Shower bench, Grab bars, and chair lift to basement; walk-in shower  PLOF: Independent; pt is primary caregiver for mother and husband  PATIENT GOALS: just to try to loosen my neck up to eliminate or decrease these headaches.  OBJECTIVE:  Note: Objective measures were completed at Evaluation unless otherwise noted.  DIAGNOSTIC FINDINGS: No recent relevant imaging.  COGNITION: Overall cognitive status: Within functional limits for tasks assessed   SENSATION: Light touch: WFL  EDEMA:  None significant noted in BLE.  Pt reports intermittent increase recently and MD aware.  MUSCLE TONE:  None noted in BLE  POSTURE:  rounded shoulders and forward head  Cervical ROM:    Active A/PROM (deg) eval  Flexion 25  Extension 20; painful at end range (compression)  Right lateral flexion   Left lateral flexion   Right rotation 46  Left rotation 42; intense stretching sensation  (Blank rows = not tested)  LOWER EXTREMITY MMT:   MMT Right eval Left eval  Hip flexion Pt able to stand and ambulate against gravity unassisted  Hip abduction   Hip adduction   Hip internal rotation   Hip external rotation   Knee flexion   Knee extension   Ankle dorsiflexion   Ankle plantarflexion   Ankle inversion    Ankle eversion    (Blank rows = not tested)  BED MOBILITY:  Sit to supine Complete Independence Supine to sit Complete  Independence Rolling to Right Complete Independence Rolling to Left Complete Independence  TRANSFERS: Assistive device utilized: None  Sit to stand: Complete Independence Stand to sit: Complete Independence Chair to chair: Complete Independence  GAIT: Gait pattern: WFL Distance walked: various clinic distances Assistive device utilized: None Level of assistance: Complete Independence Comments: No LOB or pathway deviation.  FUNCTIONAL TESTS:  MCTSIB: TBA  PATIENT SURVEYS:  DHI: THE DIZZINESS HANDICAP INVENTORY (DHI)  P1. Does looking up increase your problem? 4 = Yes  E2. Because of your problem, do you feel frustrated? 4 = Yes  F3. Because of your problem, do you restrict your travel for business or recreation?  0 = No  P4. Does walking down the aisle of a supermarket increase your problems?  2 = Sometimes  F5. Because of your problem, do you have difficulty getting into or out of bed?  2 = Sometimes  F6. Does your problem significantly restrict your participation in social activities, such as going out to dinner, going to the movies, dancing, or going to parties? 0 = No  F7. Because of your problem, do you have difficulty reading?  4 = Yes  P8. Does performing more ambitious activities such as sports, dancing, household chores (sweeping or putting dishes away) increase your problems?  4 = Yes  E9. Because of your problem, are you afraid to leave your home without having without having someone accompany you?  0 = No  E10. Because of your problem have you been embarrassed in front of others?  4 = Yes  P11. Do quick movements of your head increase your problem?  4 = Yes  F12. Because of your problem, do you avoid heights?  2 = Sometimes  P13. Does turning over in bed increase your problem?  4 = Yes  F14. Because of your problem, is it difficult for you to do strenuous homework or yard work? 4 = Yes  E15. Because of your problem, are you afraid people may think you are intoxicated?  4 = Yes  F16. Because of your problem, is it difficult for you to go for a walk by yourself?  4 = Yes  P17. Does walking down a sidewalk increase your problem?  4 = Yes  E18.Because of your problem, is it difficult for you to concentrate 4 = Yes  F19. Because of your problem, is it difficult for you to walk around your house in the dark? 2 = Sometimes  E20. Because of your problem, are you afraid to stay home alone?  0 = No  E21. Because of your problem, do you feel handicapped? 4 = Yes  E22. Has the problem placed stress on your relationships with members of your family or friends? 0 = No  E23. Because of your problem, are you depressed?  4 = Yes  F24. Does your problem interfere with your job or household responsibilities?  4 = Yes  P25. Does bending over increase your problem?  4 = Yes  TOTAL 72    DHI Scoring Instructions  The patient is asked to answer each question as it pertains to dizziness or unsteadiness problems, specifically  considering their condition during the last month. Questions are designed to incorporate functional (F), physical  (P), and emotional (E) impacts on disability.   Scores greater than 10 points should be referred to balance specialists for further evaluation.   16-34 Points (mild handicap)  36-52 Points (moderate handicap)  54+ Points (severe handicap)  Minimally Detectable Change: 17 points ([address] [place], 1990)  [place], G. SHAUNNA. and [place], C. W. (1990). The development of the Dizziness Handicap Inventory. Archives of Otolaryngology - Head and Neck Surgery 116(4): [id number].  NDI:  NECK DISABILITY INDEX  Date: 03/14/2024 Score  Pain intensity 2 = The pain is moderate at the moment  2. Personal care (washing, dressing, etc.) 0 = I can look after myself normally without causing extra pain  3. Lifting 3 = Pain prevents me from lifting heavy weights but I can manage light to medium   weights if they are conveniently positioned  4. Reading 4 =  I can  hardly read at all because  of severe pain in my neck  5. Headaches 5 = I have headaches almost all the time  6. Concentration 3 = I have a lot of difficulty in concentrating when I want to  7. Work 4 = I can hardly do any work at all  8. Driving 2 =  I can drive my car as long as I want with moderate pain in my neck  9. Sleeping 1 = My sleep is slightly disturbed (less than 1 hr sleepless)  10. Recreation 2 = I am able to engage in most, but not all of my usual recreation activities because of   pain in my neck  Total 26/50   Minimum Detectable Change (90% confidence): 5 points or 10% points  VESTIBULAR ASSESSMENT:  GENERAL OBSERVATION: Pt in treatment room under normal lighting w/ glasses on resting comfortably in seated position.   SYMPTOM BEHAVIOR:  Subjective history: See subjective above.  Non-Vestibular symptoms: neck pain, headaches, nausea/vomiting, and migraine symptoms  Type of dizziness: Spinning/Vertigo  Frequency: every other day to daily   Duration: for 2-3 hours  Aggravating factors: Induced by position change: lying supine, Worse outside or in busy environment, and Occurs when standing still   Relieving factors: dark room, closing eyes, and rest  Progression of symptoms: worse  MOTION SENSITIVITY:  Motion Sensitivity Quotient Intensity: 0 = none, 1 = Lightheaded, 2 = Mild, 3 = Moderate, 4 = Severe, 5 = Vomiting  Intensity  1. Sitting to supine Will assess.  2. Supine to L side   3. Supine to R side   4. Supine to sitting   5. L Hallpike-Dix   6. Up from L    7. R Hallpike-Dix   8. Up from R    9. Sitting, head tipped to L knee   10. Head up from L knee   11. Sitting, head tipped to R knee   12. Head up from R knee   13. Sitting head turns x5   14.Sitting head nods x5   15. In stance, 180 turn to L    16. In stance, 180 turn to R     OTHOSTATICS: LUE in supine > sitting > standing Vitals:   03/14/24 1256 03/14/24 1257  BP: (!) 141/61 (!) 144/72   Pulse: (!) 57 63   FUNCTIONAL GAIT: 5 times sit to stand: 15.28 sec no UE support                                                                                                                 TREATMENT DATE: 03/14/2024  PATIENT EDUCATION: Education details: PT POC, assessments used and to be used, and goals to be set.  Will obtain diagnosis code for vertigo and further assess next visit.  LE elevation and compression stockings for intermittent swelling of BLE.  Possible vestibular and cervical contributors to discomfort - vestibular migraine vs cervicogenic.  This PT does not feel it is BPPV, but can assess in future visit.  Discussed using heat/ice/topicals for  cervical pain management.   Person educated: Patient Education method: Explanation Education comprehension: verbalized understanding and needs further education  HOME EXERCISE PROGRAM:  GOALS: Goals reviewed with patient? Yes  SHORT TERM GOALS: Target date: 04/13/2024  Pt will be independent and compliant with introductory cervical stretching/strength, vestibular function, and balance focused HEP in order to maintain functional progress and improve mobility. Baseline:  To be established. Goal status: INITIAL  2.  *** Baseline:  Goal status: {GOALSTATUS:25110}  3.  *** Baseline:  Goal status: {GOALSTATUS:25110}  4.  *** Baseline:  Goal status: {GOALSTATUS:25110}  5.  *** Baseline:  Goal status: {GOALSTATUS:25110}  6.  *** Baseline:  Goal status: {GOALSTATUS:25110}  LONG TERM GOALS: Target date: 05/11/2024  *** Baseline:  Goal status: {GOALSTATUS:25110}  2.  *** Baseline:  Goal status: {GOALSTATUS:25110}  3.  *** Baseline:  Goal status: {GOALSTATUS:25110}  4.  *** Baseline:  Goal status: {GOALSTATUS:25110}  5.  *** Baseline:  Goal status: {GOALSTATUS:25110}  6.  *** Baseline:  Goal status: {GOALSTATUS:25110}  ASSESSMENT:  CLINICAL IMPRESSION: Patient is a *** y.o. *** who was seen today for  physical therapy evaluation and treatment for ***.  Pt has a significant PMH of ***.  Identified impairments include ***.  Evaluation via the following assessment tools: *** indicate fall risk.  They would benefit from skilled PT to address impairments as noted and progress towards long term goals.  OBJECTIVE IMPAIRMENTS: {opptimpairments:25111}.   ACTIVITY LIMITATIONS: {activitylimitations:27494}  PARTICIPATION LIMITATIONS: {participationrestrictions:25113}  PERSONAL FACTORS: {Personal factors:25162} are also affecting patient's functional outcome.   REHAB POTENTIAL: {rehabpotential:25112}  CLINICAL DECISION MAKING: {clinical decision making:25114}  EVALUATION COMPLEXITY: {Evaluation complexity:25115}   PLAN:  PT FREQUENCY: 1x/week (pt preference due to caregiving duties)  PT DURATION: 8 weeks  PLANNED INTERVENTIONS: {rehab planned interventions:25118::97110-Therapeutic exercises,97530- Therapeutic [phone]- Neuromuscular re-education,97535- Self Rjmz,02859- Manual therapy}  PLAN FOR NEXT SESSION: PIERRETTE Daved KATHEE Ulyses, PT, DPT 03/14/2024, 1:23 PM

## 2024-03-19 ENCOUNTER — Ambulatory Visit: Admitting: Physical Therapy

## 2024-03-19 ENCOUNTER — Encounter: Payer: Self-pay | Admitting: Physical Therapy

## 2024-03-19 VITALS — BP 139/82 | HR 59

## 2024-03-19 DIAGNOSIS — M542 Cervicalgia: Secondary | ICD-10-CM

## 2024-03-19 DIAGNOSIS — R42 Dizziness and giddiness: Secondary | ICD-10-CM

## 2024-03-19 DIAGNOSIS — R2681 Unsteadiness on feet: Secondary | ICD-10-CM

## 2024-03-19 NOTE — Patient Instructions (Signed)
 Access Code: [email or web address] URL: [email or web address] Date: 03/19/2024 Prepared by: Daved Bull  Exercises - Seated Gentle Upper Trapezius Stretch  - 1 x daily - 4 x weekly - 1 sets - 3 reps - 30 seconds hold - Romberg Stance Eyes Closed on Foam Pad  - 1 x daily - 4 x weekly - 1 sets - 3 reps - 30 seconds hold - Seated Chin Tuck  - 1 x daily - 4 x weekly - 2 sets - 10 reps - Hooklying Suboccipital Release with Fingers  - 1 x daily - 4 x weekly - 1-2 sets - 10 reps - Supine Suboccipital Release with Tennis Balls  - 1 x daily - 4 x weekly - 1-2 sets - 10 reps

## 2024-03-19 NOTE — Therapy (Signed)
 OUTPATIENT PHYSICAL THERAPY VESTIBULAR TREATMENT     Patient Name: [name] MRN: [id number] DOB:09/16/1955, 68 y.o., female Today's Date: 03/19/2024  END OF SESSION:  PT End of Session - 03/19/24 1201     Visit Number 2    Number of Visits 9   8 + eval   Date for PT Re-Evaluation 05/25/24   pushed out due to potential scheduling delay   Authorization Type HTA    Progress Note Due on Visit 10    PT Start Time 1155    PT Stop Time 1239    PT Time Calculation (min) 44 min    Equipment Utilized During Treatment Gait belt    Activity Tolerance Patient tolerated treatment well    Behavior During Therapy WFL for tasks assessed/performed          Past Medical History:  Diagnosis Date   Acid reflux    Apocrine metaplasia of breast    Asthma    BV (bacterial vaginosis)    Depression    h/o   Endometriosis    h/o   HSV-1 (herpes simplex virus 1) infection    Hyperlipidemia    Hypothyroid    Kidney disease    Obese    Seasonal allergies    Past Surgical History:  Procedure Laterality Date   ABDOMINAL HYSTERECTOMY     CESAREAN SECTION     x3   CHOLECYSTECTOMY N/A 04/11/2021   Procedure: LAPAROSCOPIC CHOLECYSTECTOMY;  Surgeon: Ebbie Cough, MD;  Location: [facility] OR;  Service: General;  Laterality: N/A;   dental work     cap placed 02-02-16   LEG SURGERY     had non cancerous tumors removed from left leg   SINUS ENDO W/FUSION     x3   TUBAL LIGATION     WISDOM TOOTH EXTRACTION     Patient Active Problem List   Diagnosis Date Noted   Hyperlipidemia 07/01/2023   Chest pain, rule out acute myocardial infarction 06/30/2023   Migraine 06/30/2023   Diarrhea 07/06/2022   Status post cholecystectomy 04/13/2021   Acute cholecystitis 04/10/2021   RUQ abdominal pain 05/14/2014   Abdominal pain, epigastric 05/14/2014   Nausea without vomiting 05/14/2014   Hypothyroidism 05/14/2014   GERD (gastroesophageal reflux disease) 05/14/2014   Asthma, chronic 05/14/2014     PCP: Alben Therisa MATSU, PA REFERRING PROVIDER: Skeet Juliene SAUNDERS, DO  REFERRING DIAG: M54.2 (ICD-10-CM) - Cervicalgia  THERAPY DIAG:  Cervicalgia  Dizziness and giddiness  Unsteadiness on feet  ONSET DATE: 1 year  Rationale for Evaluation and Treatment: Rehabilitation  SUBJECTIVE:   SUBJECTIVE STATEMENT: [name]  She has had one episode of turning her head to the right while laying on her back and felt herself shrinking due to dizziness.  She reports she only tipped her head slightly and the head of her bed was ever so slightly elevated.  She has been checking her BP at home without concerns.  Pt accompanied by: self (she is still able to drive herself)  PERTINENT HISTORY: GERD, hypothyroidism, kidney disease, depression, blepharospasm  PAIN:  Are you having pain? Yes: NPRS scale: 4 Pain location: headache starting at temples wrapping posteriorly Pain description: tight, caplike pressure from back of neck to top of head Aggravating factors: very loud or very bright lights Relieving factors: heat  PRECAUTIONS: Fall  RED FLAGS: None   WEIGHT BEARING RESTRICTIONS: No  FALLS: Has patient fallen in last 6 months? No  LIVING ENVIRONMENT: Lives with: lives with their spouse  Lives in: House/apartment Stairs: Yes: Internal: 15 steps; can reach both and chair lift and External: 15 steps; can reach both Has following equipment at home: Single point cane, Walker - 2 wheeled, Environmental consultant - 4 wheeled, Wheelchair (manual), Shower bench, Grab bars, and chair lift to basement; walk-in shower  PLOF: Independent; pt is primary caregiver for mother and husband  PATIENT GOALS: just to try to loosen my neck up to eliminate or decrease these headaches.  OBJECTIVE:  Note: Objective measures were completed at Evaluation unless otherwise noted.  DIAGNOSTIC FINDINGS: No recent relevant imaging.  COGNITION: Overall cognitive status: Within functional limits for tasks  assessed   SENSATION: Light touch: WFL  EDEMA:  None significant noted in BLE.  Pt reports intermittent increase recently and MD aware.  MUSCLE TONE:  None noted in BLE  POSTURE:  rounded shoulders and forward head  Cervical ROM:    Active A/PROM (deg) eval  Flexion 25  Extension 20; painful at end range (compression)  Right lateral flexion   Left lateral flexion   Right rotation 46  Left rotation 42; intense stretching sensation  (Blank rows = not tested)  LOWER EXTREMITY MMT:   MMT Right eval Left eval  Hip flexion Pt able to stand and ambulate against gravity unassisted  Hip abduction   Hip adduction   Hip internal rotation   Hip external rotation   Knee flexion   Knee extension   Ankle dorsiflexion   Ankle plantarflexion   Ankle inversion    Ankle eversion    (Blank rows = not tested)  BED MOBILITY:  Sit to supine Complete Independence Supine to sit Complete Independence Rolling to Right Complete Independence Rolling to Left Complete Independence  TRANSFERS: Assistive device utilized: None  Sit to stand: Complete Independence Stand to sit: Complete Independence Chair to chair: Complete Independence  GAIT: Gait pattern: WFL Distance walked: various clinic distances Assistive device utilized: None Level of assistance: Complete Independence Comments: No LOB or pathway deviation.  FUNCTIONAL TESTS:  MCTSIB: TBA  PATIENT SURVEYS:  DHI: THE DIZZINESS HANDICAP INVENTORY (DHI)  P1. Does looking up increase your problem? 4 = Yes  E2. Because of your problem, do you feel frustrated? 4 = Yes  F3. Because of your problem, do you restrict your travel for business or recreation?  0 = No  P4. Does walking down the aisle of a supermarket increase your problems?  2 = Sometimes  F5. Because of your problem, do you have difficulty getting into or out of bed?  2 = Sometimes  F6. Does your problem significantly restrict your participation in social  activities, such as going out to dinner, going to the movies, dancing, or going to parties? 0 = No  F7. Because of your problem, do you have difficulty reading?  4 = Yes  P8. Does performing more ambitious activities such as sports, dancing, household chores (sweeping or putting dishes away) increase your problems?  4 = Yes  E9. Because of your problem, are you afraid to leave your home without having without having someone accompany you?  0 = No  E10. Because of your problem have you been embarrassed in front of others?  4 = Yes  P11. Do quick movements of your head increase your problem?  4 = Yes  F12. Because of your problem, do you avoid heights?  2 = Sometimes  P13. Does turning over in bed increase your problem?  4 = Yes  F14. Because of your problem, is  it difficult for you to do strenuous homework or yard work? 4 = Yes  E15. Because of your problem, are you afraid people may think you are intoxicated? 4 = Yes  F16. Because of your problem, is it difficult for you to go for a walk by yourself?  4 = Yes  P17. Does walking down a sidewalk increase your problem?  4 = Yes  E18.Because of your problem, is it difficult for you to concentrate 4 = Yes  F19. Because of your problem, is it difficult for you to walk around your house in the dark? 2 = Sometimes  E20. Because of your problem, are you afraid to stay home alone?  0 = No  E21. Because of your problem, do you feel handicapped? 4 = Yes  E22. Has the problem placed stress on your relationships with members of your family or friends? 0 = No  E23. Because of your problem, are you depressed?  4 = Yes  F24. Does your problem interfere with your job or household responsibilities?  4 = Yes  P25. Does bending over increase your problem?  4 = Yes  TOTAL 72    DHI Scoring Instructions  The patient is asked to answer each question as it pertains to dizziness or unsteadiness problems, specifically  considering their condition during the last  month. Questions are designed to incorporate functional (F), physical  (P), and emotional (E) impacts on disability.   Scores greater than 10 points should be referred to balance specialists for further evaluation.   16-34 Points (mild handicap)  36-52 Points (moderate handicap)  54+ Points (severe handicap)  Minimally Detectable Change: 17 points ([address] [place], 1990)  Smoaks, G. SHAUNNA. and [place]uda, C. W. (1990). The development of the Dizziness Handicap Inventory. Archives of Otolaryngology - Head and Neck Surgery 116(4): [id number].  NDI:  NECK DISABILITY INDEX  Date: 03/14/2024 Score  Pain intensity 2 = The pain is moderate at the moment  2. Personal care (washing, dressing, etc.) 0 = I can look after myself normally without causing extra pain  3. Lifting 3 = Pain prevents me from lifting heavy weights but I can manage light to medium   weights if they are conveniently positioned  4. Reading 4 =  I can hardly read at all because of severe pain in my neck  5. Headaches 5 = I have headaches almost all the time  6. Concentration 3 = I have a lot of difficulty in concentrating when I want to  7. Work 4 = I can hardly do any work at all  8. Driving 2 =  I can drive my car as long as I want with moderate pain in my neck  9. Sleeping 1 = My sleep is slightly disturbed (less than 1 hr sleepless)  10. Recreation 2 = I am able to engage in most, but not all of my usual recreation activities because of   pain in my neck  Total 26/50   Minimum Detectable Change (90% confidence): 5 points or 10% points  VESTIBULAR ASSESSMENT:  GENERAL OBSERVATION: Pt in treatment room under normal lighting w/ glasses on resting comfortably in seated position.   SYMPTOM BEHAVIOR:  Subjective history: See subjective above.  Non-Vestibular symptoms: neck pain, headaches, nausea/vomiting, and migraine symptoms  Type of dizziness: Spinning/Vertigo  Frequency: every other day to daily   Duration: for 2-3  hours  Aggravating factors: Induced by position change: lying supine, Worse outside or in busy environment,  and Occurs when standing still   Relieving factors: dark room, closing eyes, and rest  Progression of symptoms: worse  MOTION SENSITIVITY:  Motion Sensitivity Quotient Intensity: 0 = none, 1 = Lightheaded, 2 = Mild, 3 = Moderate, 4 = Severe, 5 = Vomiting  Intensity  1. Sitting to supine Will assess.  2. Supine to L side   3. Supine to R side   4. Supine to sitting   5. L Hallpike-Dix   6. Up from L    7. R Hallpike-Dix   8. Up from R    9. Sitting, head tipped to L knee   10. Head up from L knee   11. Sitting, head tipped to R knee   12. Head up from R knee   13. Sitting head turns x5   14.Sitting head nods x5   15. In stance, 180 turn to L    16. In stance, 180 turn to R     OTHOSTATICS: LUE in supine (138/63, 62bpm) > sitting > standing Vitals:   03/19/24 1159  BP: 139/82  Pulse: (!) 59   FUNCTIONAL GAIT: 5 times sit to stand: 15.28 sec no UE support                                                                                                                 TREATMENT DATE: 03/19/2024  mCTSIB (avg of 3 trials each condition): -Condition 1:  30 seconds -Condition 2:  30 seconds -Condition 3:  30 seconds -Condition 4:  30 seconds; mild-moderate sway  -Seated upper trap stretch w/o overpressure 4x30 seconds each side, more tightness reported on left, encouraged slow transitions due to irritability of pain -Posterior shoulder rolls x20 focused on periscapular engagement -Seated chin tucks 2x10, no discomfort or dizziness -Manual traction w/ mild myofascial stretch reported > attempted self-mob w/ towel w/ no impact -Return demo of seated suboccipital massage using fingertips vs tennis ball vs spikeball - varied pressure, circles and TP holds; spikeball to left upper trap w/ reported improved pain and tightness.  Discussed ways to purchase spikeball online vs in  person at various stores and provided clean new tennis ball for pt to take home.  PATIENT EDUCATION: Education details: Outcome interpretations.  Initial HEP. Person educated: Patient Education method: Explanation Education comprehension: verbalized understanding and needs further education  HOME EXERCISE PROGRAM: Access Code: [email or web address] URL: [email or web address] Date: 03/19/2024 Prepared by: Daved Bull  Exercises - Seated Gentle Upper Trapezius Stretch  - 1 x daily - 4 x weekly - 1 sets - 3 reps - 30 seconds hold - Romberg Stance Eyes Closed on Foam Pad  - 1 x daily - 4 x weekly - 1 sets - 3 reps - 30 seconds hold - Seated Chin Tuck  - 1 x daily - 4 x weekly - 2 sets - 10 reps - Hooklying Suboccipital Release with Fingers  - 1 x daily - 4 x weekly - 1-2 sets - 10 reps - Supine Suboccipital Release with Tennis Balls  -  1 x daily - 4 x weekly - 1-2 sets - 10 reps  GOALS: Goals reviewed with patient? Yes  SHORT TERM GOALS: Target date: 04/13/2024  Pt will be independent and compliant with introductory cervical stretching/strength, vestibular function, and balance focused HEP in order to maintain functional progress and improve mobility. Baseline:  To be established. Goal status: INITIAL  2.  Pt will decrease 5xSTS to </=12 seconds w/o UE support in order to demonstrate decreased risk for falls and improved functional bilateral LE strength and power. Baseline: 15.28 sec no UE support Goal status: INITIAL  3.  Pt will improve NDI Score to </= 21/50 in order to demonstrate improved pain management and quality of life. Baseline: 26/50 Goal status: INITIAL  4.  mCTSIB to be assessed w/ goal set as appropriate. Baseline: To be assessed. Goal status: INITIAL  5.  MSQ to be assessed w/ LTG set as appropriate. Baseline: To be assessed. Goal status: INITIAL  LONG TERM GOALS: Target date: 05/11/2024  Pt will be independent and compliant with advanced and finalized  cervical stretching/strength, vestibular function, and balance focused HEP in order to maintain functional progress and improve mobility. Baseline: To be established. Goal status: INITIAL  2.  Pt will report </=5 headaches per week in order to demonstrate improved pain management and quality of life. Baseline: almost everyday Goal status: INITIAL  3.  MSQ to be assessed w/ goal set as appropriate. Baseline:  Goal status: INITIAL  4.  Pt will improve NDI Score to </= 16/50 in order to demonstrate improved pain management and quality of life. Baseline: 26/50 Goal status: INITIAL  5.  Patient w/ improve DHI to </=55 in order to demonstrate improved daily participation and symptom management. Baseline: 72 Goal status: INITIAL  6.  mCTSIB to be assessed w/ goal set as appropriate. Baseline: 120/120 (8/11) Goal status: REVISED-D/C'd 8/11  ASSESSMENT:  CLINICAL IMPRESSION: Patient fearful of laying supine today due to recent dizzy spell so session limited to seated.  Focused on cervical mobility and pain management to improve symptom management.  She continues to have caplike pressure in her head and left upper trap tightness which PT attempted to address with manual myofascial traction and suboccipital release.  She has better response to [facility] and other manual techniques vs traction today.  Initiated HEP with these components.  Will continue vestibular assessment next session.  OBJECTIVE IMPAIRMENTS: decreased activity tolerance, decreased knowledge of condition, decreased ROM, decreased strength, increased edema, increased fascial restrictions, impaired flexibility, improper body mechanics, postural dysfunction, and pain.   ACTIVITY LIMITATIONS: carrying, lifting, bending, standing, squatting, stairs, transfers, bed mobility, and reach over head  PARTICIPATION LIMITATIONS: interpersonal relationship, driving, shopping, community activity, and yard work  PERSONAL FACTORS: Fitness,  Past/current experiences, Time since onset of injury/illness/exacerbation, and 1-2 comorbidities: ocular spasms, depression are also affecting patient's functional outcome.   REHAB POTENTIAL: Good  CLINICAL DECISION MAKING: Unstable/unpredictable  EVALUATION COMPLEXITY: High   PLAN:  PT FREQUENCY: 1x/week (pt preference due to caregiving duties)  PT DURATION: 8 weeks  PLANNED INTERVENTIONS: 97164- PT Re-evaluation, 97750- Physical Performance Testing, 97110-Therapeutic exercises, 97530- Therapeutic activity, [id number]- Neuromuscular re-education, 97535- Self Care, [id number]- Manual therapy, [id number]- Gait training, [phone]- Canalith repositioning, [id number]- Aquatic Therapy, [phone]- Electrical stimulation (manual), [phone] (1-2 muscles), [id number] (3+ muscles)- Dry Needling, Patient/Family education, Balance training, Stair training, Taping, Joint mobilization, Spinal mobilization, Vestibular training, DME instructions, Cryotherapy, and Moist heat  PLAN FOR NEXT SESSION: Assess MSQ and set goals if appropriate.  Expand HEP  for vestibular components/static balance/motion sensitivity, and LE strengthening.  May want to assess oculomotor function as well - pt did have prism  glasses prior.   Daved KATHEE Bull, PT, DPT 03/19/2024, 1:59 PM

## 2024-03-21 DIAGNOSIS — G4733 Obstructive sleep apnea (adult) (pediatric): Secondary | ICD-10-CM | POA: Diagnosis not present

## 2024-03-21 DIAGNOSIS — Z6833 Body mass index (BMI) 33.0-33.9, adult: Secondary | ICD-10-CM | POA: Diagnosis not present

## 2024-03-21 DIAGNOSIS — N1832 Chronic kidney disease, stage 3b: Secondary | ICD-10-CM | POA: Diagnosis not present

## 2024-03-21 DIAGNOSIS — E669 Obesity, unspecified: Secondary | ICD-10-CM | POA: Diagnosis not present

## 2024-03-21 DIAGNOSIS — R7303 Prediabetes: Secondary | ICD-10-CM | POA: Diagnosis not present

## 2024-03-21 DIAGNOSIS — I700 Atherosclerosis of aorta: Secondary | ICD-10-CM | POA: Diagnosis not present

## 2024-03-21 DIAGNOSIS — E039 Hypothyroidism, unspecified: Secondary | ICD-10-CM | POA: Diagnosis not present

## 2024-03-22 DIAGNOSIS — G245 Blepharospasm: Secondary | ICD-10-CM | POA: Diagnosis not present

## 2024-03-24 DIAGNOSIS — G4700 Insomnia, unspecified: Secondary | ICD-10-CM | POA: Diagnosis not present

## 2024-03-24 DIAGNOSIS — G4733 Obstructive sleep apnea (adult) (pediatric): Secondary | ICD-10-CM | POA: Diagnosis not present

## 2024-03-24 DIAGNOSIS — G4710 Hypersomnia, unspecified: Secondary | ICD-10-CM | POA: Diagnosis not present

## 2024-03-26 DIAGNOSIS — G4733 Obstructive sleep apnea (adult) (pediatric): Secondary | ICD-10-CM | POA: Diagnosis not present

## 2024-03-28 ENCOUNTER — Ambulatory Visit

## 2024-03-29 ENCOUNTER — Ambulatory Visit: Admitting: Physical Therapy

## 2024-03-29 ENCOUNTER — Encounter: Payer: Self-pay | Admitting: Physical Therapy

## 2024-03-29 VITALS — BP 126/76 | HR 74

## 2024-03-29 DIAGNOSIS — R42 Dizziness and giddiness: Secondary | ICD-10-CM

## 2024-03-29 DIAGNOSIS — M542 Cervicalgia: Secondary | ICD-10-CM | POA: Diagnosis not present

## 2024-03-29 DIAGNOSIS — R2681 Unsteadiness on feet: Secondary | ICD-10-CM

## 2024-03-29 NOTE — Therapy (Addendum)
 OUTPATIENT PHYSICAL THERAPY VESTIBULAR/CERVICAL TREATMENT     Patient Name: [name] MRN: [id number] DOB:1955-08-26, 68 y.o., female Today's Date: 03/29/2024  END OF SESSION:  PT End of Session - 03/29/24 1316     Visit Number 3    Number of Visits 9   8 + eval   Date for PT Re-Evaluation 05/25/24   pushed out due to potential scheduling delay   Authorization Type HTA    Progress Note Due on Visit 10    PT Start Time 1315    PT Stop Time 1400    PT Time Calculation (min) 45 min    Equipment Utilized During Treatment --    Activity Tolerance Treatment limited secondary to medical complications (Comment)   low BP   Behavior During Therapy WFL for tasks assessed/performed;Flat affect          Past Medical History:  Diagnosis Date   Acid reflux    Apocrine metaplasia of breast    Asthma    BV (bacterial vaginosis)    Depression    h/o   Endometriosis    h/o   HSV-1 (herpes simplex virus 1) infection    Hyperlipidemia    Hypothyroid    Kidney disease    Obese    Seasonal allergies    Past Surgical History:  Procedure Laterality Date   ABDOMINAL HYSTERECTOMY     CESAREAN SECTION     x3   CHOLECYSTECTOMY N/A 04/11/2021   Procedure: LAPAROSCOPIC CHOLECYSTECTOMY;  Surgeon: Ebbie Cough, MD;  Location: [facility] OR;  Service: General;  Laterality: N/A;   dental work     cap placed 02-02-16   LEG SURGERY     had non cancerous tumors removed from left leg   SINUS ENDO W/FUSION     x3   TUBAL LIGATION     WISDOM TOOTH EXTRACTION     Patient Active Problem List   Diagnosis Date Noted   Hyperlipidemia 07/01/2023   Chest pain, rule out acute myocardial infarction 06/30/2023   Migraine 06/30/2023   Diarrhea 07/06/2022   Status post cholecystectomy 04/13/2021   Acute cholecystitis 04/10/2021   RUQ abdominal pain 05/14/2014   Abdominal pain, epigastric 05/14/2014   Nausea without vomiting 05/14/2014   Hypothyroidism 05/14/2014   GERD (gastroesophageal reflux  disease) 05/14/2014   Asthma, chronic 05/14/2014    PCP: Alben Therisa MATSU, PA REFERRING PROVIDER: Skeet Juliene SAUNDERS, DO  REFERRING DIAG: M54.2 (ICD-10-CM) - Cervicalgia  THERAPY DIAG:  Cervicalgia  Dizziness and giddiness  Unsteadiness on feet  ONSET DATE: 1 year  Rationale for Evaluation and Treatment: Rehabilitation  SUBJECTIVE:   SUBJECTIVE STATEMENT: [name]  Had a bad episode of dizziness Monday afternoon, was just sitting there and it came over her. Lasted about 2.5 hours. Reports has been having dizziness episodes for several years. Sometimes it hits her when she lies down and will start spinning. Feels different when she has the migraines and sometimes it will hit when she is just sitting still. Has to get to bed quickly because feels spinning and feels like she is in a puddle on the floor. Has to get in a position where she is completely still. Has been trying to work on the neck exercises at home. Pt reports she does have spinning when she lies down in bed, sometimes when she turns in bed it will go away quickly. Sometimes it may last minutes or 15 minutes. Sometimes it is hard to change positions. Feels like her dizziness feels  different than the migraines.    Pt accompanied by: self (she is still able to drive herself)  PERTINENT HISTORY: GERD, hypothyroidism, kidney disease, depression, blepharospasm  PAIN:  Are you having pain? Yes: NPRS scale: 5-6/10 Pain location: headache starting at temples wrapping posteriorly Pain description: tight, caplike pressure from back of neck to top of head Aggravating factors: very loud or very bright lights Relieving factors: heat  PRECAUTIONS: Fall  RED FLAGS: None   WEIGHT BEARING RESTRICTIONS: No  FALLS: Has patient fallen in last 6 months? No  LIVING ENVIRONMENT: Lives with: lives with their spouse Lives in: House/apartment Stairs: Yes: Internal: 15 steps; can reach both and chair lift and External: 15 steps; can reach  both Has following equipment at home: Single point cane, Walker - 2 wheeled, Environmental consultant - 4 wheeled, Wheelchair (manual), Shower bench, Grab bars, and chair lift to basement; walk-in shower  PLOF: Independent; pt is primary caregiver for mother and husband  PATIENT GOALS: just to try to loosen my neck up to eliminate or decrease these headaches.  OBJECTIVE:  Note: Objective measures were completed at Evaluation unless otherwise noted.  DIAGNOSTIC FINDINGS: No recent relevant imaging.  COGNITION: Overall cognitive status: Within functional limits for tasks assessed   SENSATION: Light touch: WFL  EDEMA:  None significant noted in BLE.  Pt reports intermittent increase recently and MD aware.  MUSCLE TONE:  None noted in BLE  POSTURE:  rounded shoulders and forward head  Cervical ROM:    Active A/PROM (deg) eval  Flexion 25  Extension 20; painful at end range (compression)  Right lateral flexion   Left lateral flexion   Right rotation 46  Left rotation 42; intense stretching sensation  (Blank rows = not tested)  LOWER EXTREMITY MMT:   MMT Right eval Left eval  Hip flexion Pt able to stand and ambulate against gravity unassisted  Hip abduction   Hip adduction   Hip internal rotation   Hip external rotation   Knee flexion   Knee extension   Ankle dorsiflexion   Ankle plantarflexion   Ankle inversion    Ankle eversion    (Blank rows = not tested)  BED MOBILITY:  Sit to supine Complete Independence Supine to sit Complete Independence Rolling to Right Complete Independence Rolling to Left Complete Independence  TRANSFERS: Assistive device utilized: None  Sit to stand: Complete Independence Stand to sit: Complete Independence Chair to chair: Complete Independence  GAIT: Gait pattern: WFL Distance walked: various clinic distances Assistive device utilized: None Level of assistance: Complete Independence Comments: No LOB or pathway  deviation.  FUNCTIONAL TESTS:  MCTSIB: TBA  PATIENT SURVEYS:  DHI: THE DIZZINESS HANDICAP INVENTORY (DHI)  P1. Does looking up increase your problem? 4 = Yes  E2. Because of your problem, do you feel frustrated? 4 = Yes  F3. Because of your problem, do you restrict your travel for business or recreation?  0 = No  P4. Does walking down the aisle of a supermarket increase your problems?  2 = Sometimes  F5. Because of your problem, do you have difficulty getting into or out of bed?  2 = Sometimes  F6. Does your problem significantly restrict your participation in social activities, such as going out to dinner, going to the movies, dancing, or going to parties? 0 = No  F7. Because of your problem, do you have difficulty reading?  4 = Yes  P8. Does performing more ambitious activities such as sports, dancing, household chores (sweeping or  putting dishes away) increase your problems?  4 = Yes  E9. Because of your problem, are you afraid to leave your home without having without having someone accompany you?  0 = No  E10. Because of your problem have you been embarrassed in front of others?  4 = Yes  P11. Do quick movements of your head increase your problem?  4 = Yes  F12. Because of your problem, do you avoid heights?  2 = Sometimes  P13. Does turning over in bed increase your problem?  4 = Yes  F14. Because of your problem, is it difficult for you to do strenuous homework or yard work? 4 = Yes  E15. Because of your problem, are you afraid people may think you are intoxicated? 4 = Yes  F16. Because of your problem, is it difficult for you to go for a walk by yourself?  4 = Yes  P17. Does walking down a sidewalk increase your problem?  4 = Yes  E18.Because of your problem, is it difficult for you to concentrate 4 = Yes  F19. Because of your problem, is it difficult for you to walk around your house in the dark? 2 = Sometimes  E20. Because of your problem, are you afraid to stay home alone?  0 =  No  E21. Because of your problem, do you feel handicapped? 4 = Yes  E22. Has the problem placed stress on your relationships with members of your family or friends? 0 = No  E23. Because of your problem, are you depressed?  4 = Yes  F24. Does your problem interfere with your job or household responsibilities?  4 = Yes  P25. Does bending over increase your problem?  4 = Yes  TOTAL 72    DHI Scoring Instructions  The patient is asked to answer each question as it pertains to dizziness or unsteadiness problems, specifically  considering their condition during the last month. Questions are designed to incorporate functional (F), physical  (P), and emotional (E) impacts on disability.   Scores greater than 10 points should be referred to balance specialists for further evaluation.   16-34 Points (mild handicap)  36-52 Points (moderate handicap)  54+ Points (severe handicap)  Minimally Detectable Change: 17 points ([address] [place], 1990)  Perry, G. SHAUNNA. and Kendall, C. W. (1990). The development of the Dizziness Handicap Inventory. Archives of Otolaryngology - Head and Neck Surgery 116(4): [id number].  NDI:  NECK DISABILITY INDEX  Date: 03/14/2024 Score  Pain intensity 2 = The pain is moderate at the moment  2. Personal care (washing, dressing, etc.) 0 = I can look after myself normally without causing extra pain  3. Lifting 3 = Pain prevents me from lifting heavy weights but I can manage light to medium   weights if they are conveniently positioned  4. Reading 4 =  I can hardly read at all because of severe pain in my neck  5. Headaches 5 = I have headaches almost all the time  6. Concentration 3 = I have a lot of difficulty in concentrating when I want to  7. Work 4 = I can hardly do any work at all  8. Driving 2 =  I can drive my car as long as I want with moderate pain in my neck  9. Sleeping 1 = My sleep is slightly disturbed (less than 1 hr sleepless)  10. Recreation 2 = I am able to  engage in most, but not all  of my usual recreation activities because of   pain in my neck  Total 26/50   Minimum Detectable Change (90% confidence): 5 points or 10% points  VESTIBULAR ASSESSMENT:  GENERAL OBSERVATION: Pt in treatment room under normal lighting w/ glasses on resting comfortably in seated position.   SYMPTOM BEHAVIOR:  Subjective history: See subjective above.  Non-Vestibular symptoms: neck pain, headaches, nausea/vomiting, and migraine symptoms  Type of dizziness: Spinning/Vertigo  Frequency: every other day to daily   Duration: for 2-3 hours  Aggravating factors: Induced by position change: lying supine, Worse outside or in busy environment, and Occurs when standing still   Relieving factors: dark room, closing eyes, and rest  Progression of symptoms: worse   OTHOSTATICS: LUE in supine (138/63, 62bpm) > sitting > standing Vitals:   03/29/24 1326 03/29/24 1329  BP: (!) 68/47 126/76  Pulse: 70 74    FUNCTIONAL GAIT: 5 times sit to stand: 15.28 sec no UE support                                                                                                                 TREATMENT DATE: 03/29/24  Self-Care:  Vitals:   03/29/24 1326 03/29/24 1329  BP: (!) 68/47 126/76  Pulse: 70 74   Seated and supine with legs elevated   Assessed again at 12:33 PM: 129/76, HR: 70 bpm  Pt called PCP regarding pt's current status and BP as pt also had lower BP earlier today (that was sent to their office) and it did incr at home after pt checked it again later.  PT spoke to someone at their office and gave the details of pt's BP and they were able to see pt at 2 PM (pt unable to make it their in time) or to go to the ER if BP continued to be low. PT called pt's husband to come pick pt up as she drove here.   BP at 1:42 PM: 125/75  Pt reporting head was feeling worse with the longer she lays down and feels a band around her head that gets tight (this is how pt's headaches  normally feels)  Sat pt up with pt feeling lightheaded and needing steadying from PT and initially feeling dizzy. PT provided pt with water.  Assessed BP at 1:46 PM: 133/76, HR: 71 bpm Still feeling lightheaded, but not as bad. Pt also reports not feeling as nauseous sitting up than sitting down  Assessed BP at 1:53  in sitting: 119/80, HR: 70 Assessed BP at 1:58 in sitting: 115/66, HR: 69  Discussed continuing to assess BP at home and rest and drink plenty of water. Discussed does not need to go to the ER now that pt is feeling better and BP is higher. However, educated if BP decr and pt feels symptomatic, then would need to go to the ER or call 911. Pt verbalized understanding.   At end of session, pt handed off to 2nd PT until pt's husband arrived to pick her up and take  her home now that BP has stabilized in sitting. Primary PT had to get their next patient. Pt seated and wheeled out to car in clinic w/c.   PATIENT EDUCATION: Education details: See above.  Person educated: Patient Education method: Explanation Education comprehension: verbalized understanding and needs further education  HOME EXERCISE PROGRAM: Access Code: [email or web address] URL: [email or web address] Date: 03/19/2024 Prepared by: Daved Bull  Exercises - Seated Gentle Upper Trapezius Stretch  - 1 x daily - 4 x weekly - 1 sets - 3 reps - 30 seconds hold - Romberg Stance Eyes Closed on Foam Pad  - 1 x daily - 4 x weekly - 1 sets - 3 reps - 30 seconds hold - Seated Chin Tuck  - 1 x daily - 4 x weekly - 2 sets - 10 reps - Hooklying Suboccipital Release with Fingers  - 1 x daily - 4 x weekly - 1-2 sets - 10 reps - Supine Suboccipital Release with Tennis Balls  - 1 x daily - 4 x weekly - 1-2 sets - 10 reps  GOALS: Goals reviewed with patient? Yes  SHORT TERM GOALS: Target date: 04/13/2024  Pt will be independent and compliant with introductory cervical stretching/strength, vestibular function, and balance  focused HEP in order to maintain functional progress and improve mobility. Baseline:  To be established. Goal status: INITIAL  2.  Pt will decrease 5xSTS to </=12 seconds w/o UE support in order to demonstrate decreased risk for falls and improved functional bilateral LE strength and power. Baseline: 15.28 sec no UE support Goal status: INITIAL  3.  Pt will improve NDI Score to </= 21/50 in order to demonstrate improved pain management and quality of life. Baseline: 26/50 Goal status: INITIAL  4.  mCTSIB to be assessed w/ goal set as appropriate. Baseline: goal not needed  Goal status: MET  5.  MSQ to be assessed w/ LTG set as appropriate. Baseline: To be assessed. Goal status: INITIAL  LONG TERM GOALS: Target date: 05/11/2024  Pt will be independent and compliant with advanced and finalized cervical stretching/strength, vestibular function, and balance focused HEP in order to maintain functional progress and improve mobility. Baseline: To be established. Goal status: INITIAL  2.  Pt will report </=5 headaches per week in order to demonstrate improved pain management and quality of life. Baseline: almost everyday Goal status: INITIAL  3.  MSQ to be assessed w/ goal set as appropriate. Baseline:  Goal status: INITIAL  4.  Pt will improve NDI Score to </= 16/50 in order to demonstrate improved pain management and quality of life. Baseline: 26/50 Goal status: INITIAL  5.  Patient w/ improve DHI to </=55 in order to demonstrate improved daily participation and symptom management. Baseline: 72 Goal status: INITIAL  6.  mCTSIB to be assessed w/ goal set as appropriate. Baseline: 120/120 (8/11) Goal status: REVISED-D/C'd 8/11  ASSESSMENT:  CLINICAL IMPRESSION: See above for further information - session limited due to low BP in sitting. Pt laid supine for an extended period of time and BP incr. With further return to sitting, BP incr compared to at start of session and pt  reporting feeling better. Pt's husband came to pick pt up from clinic and for pt to go home and rest, drink water, and monitor BP.   OBJECTIVE IMPAIRMENTS: decreased activity tolerance, decreased knowledge of condition, decreased ROM, decreased strength, increased edema, increased fascial restrictions, impaired flexibility, improper body mechanics, postural dysfunction, and pain.   ACTIVITY LIMITATIONS: carrying, lifting, bending, standing, squatting,  stairs, transfers, bed mobility, and reach over head  PARTICIPATION LIMITATIONS: interpersonal relationship, driving, shopping, community activity, and yard work  PERSONAL FACTORS: Fitness, Past/current experiences, Time since onset of injury/illness/exacerbation, and 1-2 comorbidities: ocular spasms, depression are also affecting patient's functional outcome.   REHAB POTENTIAL: Good  CLINICAL DECISION MAKING: Unstable/unpredictable  EVALUATION COMPLEXITY: High   PLAN:  PT FREQUENCY: 1x/week (pt preference due to caregiving duties)  PT DURATION: 8 weeks  PLANNED INTERVENTIONS: 97164- PT Re-evaluation, 97750- Physical Performance Testing, 97110-Therapeutic exercises, 97530- Therapeutic activity, [id number]- Neuromuscular re-education, 97535- Self Care, [id number]- Manual therapy, [id number]- Gait training, [phone]- Canalith repositioning, [id number]- Aquatic Therapy, [phone]- Electrical stimulation (manual), [phone] (1-2 muscles), [id number] (3+ muscles)- Dry Needling, Patient/Family education, Balance training, Stair training, Taping, Joint mobilization, Spinal mobilization, Vestibular training, DME instructions, Cryotherapy, and Moist heat  PLAN FOR NEXT SESSION: Assess MSQ and set goals if appropriate.  Expand HEP for vestibular components/static balance/motion sensitivity, and LE strengthening.  May want to assess oculomotor function as well - pt did have prism  glasses prior.  CHECK BP!!! Was very low at visit on 03/29/24   [place] LOISE Senate, PT, DPT 03/29/2024, 2:52  PM

## 2024-03-30 DIAGNOSIS — I959 Hypotension, unspecified: Secondary | ICD-10-CM | POA: Diagnosis not present

## 2024-03-30 DIAGNOSIS — J988 Other specified respiratory disorders: Secondary | ICD-10-CM | POA: Diagnosis not present

## 2024-03-30 DIAGNOSIS — R739 Hyperglycemia, unspecified: Secondary | ICD-10-CM | POA: Diagnosis not present

## 2024-03-30 DIAGNOSIS — R053 Chronic cough: Secondary | ICD-10-CM | POA: Diagnosis not present

## 2024-04-01 DIAGNOSIS — J988 Other specified respiratory disorders: Secondary | ICD-10-CM | POA: Diagnosis not present

## 2024-04-01 DIAGNOSIS — R051 Acute cough: Secondary | ICD-10-CM | POA: Diagnosis not present

## 2024-04-03 ENCOUNTER — Encounter: Payer: Self-pay | Admitting: Physical Therapy

## 2024-04-03 ENCOUNTER — Ambulatory Visit: Admitting: Physical Therapy

## 2024-04-03 VITALS — BP 114/81 | HR 73

## 2024-04-03 DIAGNOSIS — M542 Cervicalgia: Secondary | ICD-10-CM

## 2024-04-03 DIAGNOSIS — R2681 Unsteadiness on feet: Secondary | ICD-10-CM

## 2024-04-03 DIAGNOSIS — R42 Dizziness and giddiness: Secondary | ICD-10-CM

## 2024-04-03 NOTE — Therapy (Signed)
 OUTPATIENT PHYSICAL THERAPY VESTIBULAR/CERVICAL TREATMENT     Patient Name: [name] MRN: [id number] DOB:09-09-1955, 68 y.o., female Today's Date: 04/03/2024  END OF SESSION:  PT End of Session - 04/03/24 1308     Visit Number 4    Number of Visits 9   8 + eval   Date for PT Re-Evaluation 05/25/24   pushed out due to potential scheduling delay   Authorization Type HTA    Progress Note Due on Visit 10    PT Start Time 1306    PT Stop Time 1346    PT Time Calculation (min) 40 min    Activity Tolerance Patient tolerated treatment well    Behavior During Therapy WFL for tasks assessed/performed          Past Medical History:  Diagnosis Date   Acid reflux    Apocrine metaplasia of breast    Asthma    BV (bacterial vaginosis)    Depression    h/o   Endometriosis    h/o   HSV-1 (herpes simplex virus 1) infection    Hyperlipidemia    Hypothyroid    Kidney disease    Obese    Seasonal allergies    Past Surgical History:  Procedure Laterality Date   ABDOMINAL HYSTERECTOMY     CESAREAN SECTION     x3   CHOLECYSTECTOMY N/A 04/11/2021   Procedure: LAPAROSCOPIC CHOLECYSTECTOMY;  Surgeon: Ebbie Cough, MD;  Location: [facility] OR;  Service: General;  Laterality: N/A;   dental work     cap placed 02-02-16   LEG SURGERY     had non cancerous tumors removed from left leg   SINUS ENDO W/FUSION     x3   TUBAL LIGATION     WISDOM TOOTH EXTRACTION     Patient Active Problem List   Diagnosis Date Noted   Hyperlipidemia 07/01/2023   Chest pain, rule out acute myocardial infarction 06/30/2023   Migraine 06/30/2023   Diarrhea 07/06/2022   Status post cholecystectomy 04/13/2021   Acute cholecystitis 04/10/2021   RUQ abdominal pain 05/14/2014   Abdominal pain, epigastric 05/14/2014   Nausea without vomiting 05/14/2014   Hypothyroidism 05/14/2014   GERD (gastroesophageal reflux disease) 05/14/2014   Asthma, chronic 05/14/2014    PCP: Alben Therisa MATSU, PA REFERRING  PROVIDER: Skeet Juliene SAUNDERS, DO  REFERRING DIAG: M54.2 (ICD-10-CM) - Cervicalgia  THERAPY DIAG:  Dizziness and giddiness  Cervicalgia  Unsteadiness on feet  ONSET DATE: 1 year  Rationale for Evaluation and Treatment: Rehabilitation  SUBJECTIVE:   SUBJECTIVE STATEMENT: [name]  Saw her PCP last and ran all these tests and they found out that she has a cold. Got tested for COVID, RSV, and the flu and everything turned out fine. BP has been good at home with no issues. Still having dizziness with laying down and turning some that just lasts for a few seconds to a minute, but nothing long term dizziness.    Pt accompanied by: self (she is still able to drive herself)  PERTINENT HISTORY: GERD, hypothyroidism, kidney disease, depression, blepharospasm  PAIN:  Are you having pain? Yes: NPRS scale: 3/10 Pain location: headache starting at temples wrapping posteriorly Pain description: tight, caplike pressure from back of neck to top of head Aggravating factors: very loud or very bright lights Relieving factors: heat  PRECAUTIONS: Fall  RED FLAGS: None   WEIGHT BEARING RESTRICTIONS: No  FALLS: Has patient fallen in last 6 months? No  LIVING ENVIRONMENT: Lives with: lives with  their spouse Lives in: House/apartment Stairs: Yes: Internal: 15 steps; can reach both and chair lift and External: 15 steps; can reach both Has following equipment at home: Single point cane, Walker - 2 wheeled, Environmental consultant - 4 wheeled, Wheelchair (manual), Shower bench, Grab bars, and chair lift to basement; walk-in shower  PLOF: Independent; pt is primary caregiver for mother and husband  PATIENT GOALS: just to try to loosen my neck up to eliminate or decrease these headaches.  OBJECTIVE:  Note: Objective measures were completed at Evaluation unless otherwise noted.  DIAGNOSTIC FINDINGS: No recent relevant imaging.  COGNITION: Overall cognitive status: Within functional limits for tasks  assessed   SENSATION: Light touch: WFL  EDEMA:  None significant noted in BLE.  Pt reports intermittent increase recently and MD aware.  MUSCLE TONE:  None noted in BLE  POSTURE:  rounded shoulders and forward head  Cervical ROM:    Active A/PROM (deg) eval  Flexion 25  Extension 20; painful at end range (compression)  Right lateral flexion   Left lateral flexion   Right rotation 46  Left rotation 42; intense stretching sensation  (Blank rows = not tested)  LOWER EXTREMITY MMT:   MMT Right eval Left eval  Hip flexion Pt able to stand and ambulate against gravity unassisted  Hip abduction   Hip adduction   Hip internal rotation   Hip external rotation   Knee flexion   Knee extension   Ankle dorsiflexion   Ankle plantarflexion   Ankle inversion    Ankle eversion    (Blank rows = not tested)  BED MOBILITY:  Sit to supine Complete Independence Supine to sit Complete Independence Rolling to Right Complete Independence Rolling to Left Complete Independence  TRANSFERS: Assistive device utilized: None  Sit to stand: Complete Independence Stand to sit: Complete Independence Chair to chair: Complete Independence  GAIT: Gait pattern: WFL Distance walked: various clinic distances Assistive device utilized: None Level of assistance: Complete Independence Comments: No LOB or pathway deviation.  FUNCTIONAL TESTS:  MCTSIB: TBA  PATIENT SURVEYS:  DHI: THE DIZZINESS HANDICAP INVENTORY (DHI)  P1. Does looking up increase your problem? 4 = Yes  E2. Because of your problem, do you feel frustrated? 4 = Yes  F3. Because of your problem, do you restrict your travel for business or recreation?  0 = No  P4. Does walking down the aisle of a supermarket increase your problems?  2 = Sometimes  F5. Because of your problem, do you have difficulty getting into or out of bed?  2 = Sometimes  F6. Does your problem significantly restrict your participation in social  activities, such as going out to dinner, going to the movies, dancing, or going to parties? 0 = No  F7. Because of your problem, do you have difficulty reading?  4 = Yes  P8. Does performing more ambitious activities such as sports, dancing, household chores (sweeping or putting dishes away) increase your problems?  4 = Yes  E9. Because of your problem, are you afraid to leave your home without having without having someone accompany you?  0 = No  E10. Because of your problem have you been embarrassed in front of others?  4 = Yes  P11. Do quick movements of your head increase your problem?  4 = Yes  F12. Because of your problem, do you avoid heights?  2 = Sometimes  P13. Does turning over in bed increase your problem?  4 = Yes  F14. Because of your  problem, is it difficult for you to do strenuous homework or yard work? 4 = Yes  E15. Because of your problem, are you afraid people may think you are intoxicated? 4 = Yes  F16. Because of your problem, is it difficult for you to go for a walk by yourself?  4 = Yes  P17. Does walking down a sidewalk increase your problem?  4 = Yes  E18.Because of your problem, is it difficult for you to concentrate 4 = Yes  F19. Because of your problem, is it difficult for you to walk around your house in the dark? 2 = Sometimes  E20. Because of your problem, are you afraid to stay home alone?  0 = No  E21. Because of your problem, do you feel handicapped? 4 = Yes  E22. Has the problem placed stress on your relationships with members of your family or friends? 0 = No  E23. Because of your problem, are you depressed?  4 = Yes  F24. Does your problem interfere with your job or household responsibilities?  4 = Yes  P25. Does bending over increase your problem?  4 = Yes  TOTAL 72    DHI Scoring Instructions  The patient is asked to answer each question as it pertains to dizziness or unsteadiness problems, specifically  considering their condition during the last  month. Questions are designed to incorporate functional (F), physical  (P), and emotional (E) impacts on disability.   Scores greater than 10 points should be referred to balance specialists for further evaluation.   16-34 Points (mild handicap)  36-52 Points (moderate handicap)  54+ Points (severe handicap)  Minimally Detectable Change: 17 points ([address] [place], 1990)  Gordon, G. SHAUNNA. and Itmann, C. W. (1990). The development of the Dizziness Handicap Inventory. Archives of Otolaryngology - Head and Neck Surgery 116(4): [id number].  NDI:  NECK DISABILITY INDEX  Date: 03/14/2024 Score  Pain intensity 2 = The pain is moderate at the moment  2. Personal care (washing, dressing, etc.) 0 = I can look after myself normally without causing extra pain  3. Lifting 3 = Pain prevents me from lifting heavy weights but I can manage light to medium   weights if they are conveniently positioned  4. Reading 4 =  I can hardly read at all because of severe pain in my neck  5. Headaches 5 = I have headaches almost all the time  6. Concentration 3 = I have a lot of difficulty in concentrating when I want to  7. Work 4 = I can hardly do any work at all  8. Driving 2 =  I can drive my car as long as I want with moderate pain in my neck  9. Sleeping 1 = My sleep is slightly disturbed (less than 1 hr sleepless)  10. Recreation 2 = I am able to engage in most, but not all of my usual recreation activities because of   pain in my neck  Total 26/50   Minimum Detectable Change (90% confidence): 5 points or 10% points  VESTIBULAR ASSESSMENT:  GENERAL OBSERVATION: Pt in treatment room under normal lighting w/ glasses on resting comfortably in seated position.   SYMPTOM BEHAVIOR:  Subjective history: See subjective above.  Non-Vestibular symptoms: neck pain, headaches, nausea/vomiting, and migraine symptoms  Type of dizziness: Spinning/Vertigo  Frequency: every other day to daily   Duration: for 2-3  hours  Aggravating factors: Induced by position change: lying supine, Worse outside or in  busy environment, and Occurs when standing still   Relieving factors: dark room, closing eyes, and rest  Progression of symptoms: worse   OTHOSTATICS: LUE in supine (138/63, 62bpm) > sitting > standing Vitals:   04/03/24 1312  BP: 114/81  Pulse: 73     FUNCTIONAL GAIT: 5 times sit to stand: 15.28 sec no UE support                                                                                                                 TREATMENT DATE: 04/03/24  Therapeutic Activity: Vitals:   04/03/24 1312  BP: 114/81  Pulse: 73     VESTIBULAR ASSESSMENT   OCULOMOTOR EXAM:   Ocular Alignment: normal   Ocular ROM: No Limitations   Spontaneous Nystagmus: absent   Gaze-Induced Nystagmus: absent   Smooth Pursuits: intact and pt reporting some dizziness when going to the R    Saccades: hypometric/undershoots, extra eye movements, and ~3 saccadic beats going to the R and L, incr dizziness when going to the R. In vertical direction, going inferiorly, reporting feeling nauseous    Cervical AROM: WFL, pt reporting more dizziness with head nods and feeling some woozy when coming back to center    VESTIBULAR - OCULAR REFLEX:    Slow VOR: Normal   VOR Cancellation: Normal, pt reporting incr dizziness, feels like she is spinning    Head-Impulse Test: HIT Right: positive HIT Left: positive Very slight both directions, reporting very slight dizziness      POSITIONAL TESTING: Right Dix-Hallpike: no nystagmus and no dizziness with return to upright Left Dix-Hallpike: no nystagmus and no dizziness with return to upright Right Roll Test: no nystagmus Left Roll Test: no nystagmus Right Sidelying: no nystagmus and pt reporting a spinning dizziness with return to upright  Left Sidelying: no nystagmus and pt reporting a spinning dizziness with return to upright, worse on R side     MOTION  SENSITIVITY:    Motion Sensitivity Quotient  Intensity: 0 = none, 1 = Lightheaded, 2 = Mild, 3 = Moderate, 4 = Severe, 5 = Vomiting  Intensity  1. Sitting to supine 0  2. Supine to L side 0  3. Supine to R side 0  4. Supine to sitting 2  5. L Hallpike-Dix 0  6. Up from L  0  7. R Hallpike-Dix 0  8. Up from R  0  9. Sitting, head  tipped to L knee 0  10. Head up from L  knee 0  11. Sitting, head  tipped to R knee 0  12. Head up from R  knee 0  13. Sitting head turns x5 1-2  14.Sitting head nods x5 2  15. In stance, 180  turn to L  1  16. In stance, 180  turn to R 0   NMR:   VESTIBULAR TREATMENT:  Habituation: Brandt-Daroff: comment: performed 3 reps each side, pt with no dizziness in sidelying position, but did report dizziness with return to upright, worse going to  R    Educated on purpose of [name] Carrel exercises and added to HEP for habituation   PATIENT EDUCATION: Education details:  results of vestibular assessment, appears that dizziness may be related to migraines/vestibular migraine as pt negative for BPPV, areas that PT will address in therapy in regards to dizziness, [name] Carrel additions to HEP  Person educated: Patient Education method: Explanation, Demonstration, Verbal cues, and Handouts Education comprehension: verbalized understanding, returned demonstration, and needs further education  HOME EXERCISE PROGRAM: Access Code: [email or web address] URL: [email or web address] Date: 04/03/2024 Prepared by: Sheffield Senate  Exercises - Seated Gentle Upper Trapezius Stretch  - 1 x daily - 4 x weekly - 1 sets - 3 reps - 30 seconds hold - Romberg Stance Eyes Closed on Foam Pad  - 1 x daily - 4 x weekly - 1 sets - 3 reps - 30 seconds hold - Seated Chin Tuck  - 1 x daily - 4 x weekly - 2 sets - 10 reps - Hooklying Suboccipital Release with Fingers  - 1 x daily - 4 x weekly - 1-2 sets - 10 reps - Supine Suboccipital Release with Tennis Balls  - 1 x daily  - 4 x weekly - 1-2 sets - 10 reps - Brandt-Daroff Vestibular Exercise  - 2 x daily - 7 x weekly - 4-5 reps  GOALS: Goals reviewed with patient? Yes  SHORT TERM GOALS: Target date: 04/13/2024  Pt will be independent and compliant with introductory cervical stretching/strength, vestibular function, and balance focused HEP in order to maintain functional progress and improve mobility. Baseline:  To be established. Goal status: INITIAL  2.  Pt will decrease 5xSTS to </=12 seconds w/o UE support in order to demonstrate decreased risk for falls and improved functional bilateral LE strength and power. Baseline: 15.28 sec no UE support Goal status: INITIAL  3.  Pt will improve NDI Score to </= 21/50 in order to demonstrate improved pain management and quality of life. Baseline: 26/50 Goal status: INITIAL  4.  mCTSIB to be assessed w/ goal set as appropriate. Baseline: goal not needed  Goal status: MET  5.  MSQ to be assessed w/ LTG set as appropriate. Baseline:goal written  Goal status: MET  LONG TERM GOALS: Target date: 05/11/2024  Pt will be independent and compliant with advanced and finalized cervical stretching/strength, vestibular function, and balance focused HEP in order to maintain functional progress and improve mobility. Baseline: To be established. Goal status: INITIAL  2.  Pt will report </=5 headaches per week in order to demonstrate improved pain management and quality of life. Baseline: almost everyday Goal status: INITIAL  3.  Pt will report 0/5 on turning, head motions, and supine to sit in order to demo improved motion sensitivity. Baseline: 1-2/5 (see chart on 8/26) Goal status: INITIAL  4.  Pt will improve NDI Score to </= 16/50 in order to demonstrate improved pain management and quality of life. Baseline: 26/50 Goal status: INITIAL  5.  Patient w/ improve DHI to </=55 in order to demonstrate improved daily participation and symptom management. Baseline:  72 Goal status: INITIAL  6.  mCTSIB to be assessed w/ goal set as appropriate. Baseline: 120/120 (8/11) Goal status: REVISED-D/C'd 8/11  ASSESSMENT:  CLINICAL IMPRESSION: Pt's BP WNL during session compared to previous session where BP was significantly low. Pt reports no other episodes of low BP at home. Today's skilled session focused on vestibular assessment. Pt with dizziness with smooth pursuits, hypometric saccades with dizziness, bilateral positive HIT and  mild dizziness, motion sensitivity with sidelying > sit, head motions, and turns. Pt negative for BPPV testing - pt with no nystagmus or dizziness in position, but did have dizziness with return to upright in sitting. Appears that pt's dizziness may be related to migraines as pt negative for BPPV today. Did provide [name] Carrel exercises for HEP to work on habituation with dizziness with return to upright, with pt having more symptoms coming up from the R. Will continue per POC.    OBJECTIVE IMPAIRMENTS: decreased activity tolerance, decreased knowledge of condition, decreased ROM, decreased strength, increased edema, increased fascial restrictions, impaired flexibility, improper body mechanics, postural dysfunction, and pain.   ACTIVITY LIMITATIONS: carrying, lifting, bending, standing, squatting, stairs, transfers, bed mobility, and reach over head  PARTICIPATION LIMITATIONS: interpersonal relationship, driving, shopping, community activity, and yard work  PERSONAL FACTORS: Fitness, Past/current experiences, Time since onset of injury/illness/exacerbation, and 1-2 comorbidities: ocular spasms, depression are also affecting patient's functional outcome.   REHAB POTENTIAL: Good  CLINICAL DECISION MAKING: Unstable/unpredictable  EVALUATION COMPLEXITY: High   PLAN:  PT FREQUENCY: 1x/week (pt preference due to caregiving duties)  PT DURATION: 8 weeks  PLANNED INTERVENTIONS: 97164- PT Re-evaluation, 97750- Physical Performance  Testing, 97110-Therapeutic exercises, 97530- Therapeutic activity, [id number]- Neuromuscular re-education, 97535- Self Care, [id number]- Manual therapy, [id number]- Gait training, [phone]- Canalith repositioning, [id number]- Aquatic Therapy, [phone]- Electrical stimulation (manual), [phone] (1-2 muscles), [id number] (3+ muscles)- Dry Needling, Patient/Family education, Balance training, Stair training, Taping, Joint mobilization, Spinal mobilization, Vestibular training, DME instructions, Cryotherapy, and Moist heat  PLAN FOR NEXT SESSION: how were brandt daroff exercises? Re-check positional testing as needed Work on VOR, head motions, saccades, smooth pursuits, balance with EC    Sheffield LOISE Senate, PT, DPT 04/03/2024, 1:51 PM

## 2024-04-04 ENCOUNTER — Other Ambulatory Visit: Payer: Self-pay | Admitting: Neurology

## 2024-04-06 DIAGNOSIS — N1832 Chronic kidney disease, stage 3b: Secondary | ICD-10-CM | POA: Diagnosis not present

## 2024-04-06 DIAGNOSIS — N1831 Chronic kidney disease, stage 3a: Secondary | ICD-10-CM | POA: Diagnosis not present

## 2024-04-08 DIAGNOSIS — E039 Hypothyroidism, unspecified: Secondary | ICD-10-CM | POA: Diagnosis not present

## 2024-04-08 DIAGNOSIS — N1832 Chronic kidney disease, stage 3b: Secondary | ICD-10-CM | POA: Diagnosis not present

## 2024-04-08 DIAGNOSIS — F331 Major depressive disorder, recurrent, moderate: Secondary | ICD-10-CM | POA: Diagnosis not present

## 2024-04-08 DIAGNOSIS — N1831 Chronic kidney disease, stage 3a: Secondary | ICD-10-CM | POA: Diagnosis not present

## 2024-04-10 ENCOUNTER — Ambulatory Visit: Attending: Neurology

## 2024-04-10 VITALS — BP 127/67 | HR 64

## 2024-04-10 DIAGNOSIS — M542 Cervicalgia: Secondary | ICD-10-CM | POA: Insufficient documentation

## 2024-04-10 DIAGNOSIS — R2681 Unsteadiness on feet: Secondary | ICD-10-CM | POA: Diagnosis not present

## 2024-04-10 DIAGNOSIS — R42 Dizziness and giddiness: Secondary | ICD-10-CM | POA: Insufficient documentation

## 2024-04-10 NOTE — Therapy (Signed)
 OUTPATIENT PHYSICAL THERAPY VESTIBULAR/CERVICAL TREATMENT     Patient Name: [name] MRN: [id number] DOB:04-Aug-1956, 68 y.o., female Today's Date: 04/10/2024  END OF SESSION:  PT End of Session - 04/10/24 1148     Visit Number 5    Number of Visits 9    Date for PT Re-Evaluation 05/25/24    Authorization Type HTA    Progress Note Due on Visit 10    PT Start Time 1146    PT Stop Time 1215   BPPV tx   PT Time Calculation (min) 29 min    Activity Tolerance Patient tolerated treatment well    Behavior During Therapy WFL for tasks assessed/performed          Past Medical History:  Diagnosis Date   Acid reflux    Apocrine metaplasia of breast    Asthma    BV (bacterial vaginosis)    Depression    h/o   Endometriosis    h/o   HSV-1 (herpes simplex virus 1) infection    Hyperlipidemia    Hypothyroid    Kidney disease    Obese    Seasonal allergies    Past Surgical History:  Procedure Laterality Date   ABDOMINAL HYSTERECTOMY     CESAREAN SECTION     x3   CHOLECYSTECTOMY N/A 04/11/2021   Procedure: LAPAROSCOPIC CHOLECYSTECTOMY;  Surgeon: Ebbie Cough, MD;  Location: [facility] OR;  Service: General;  Laterality: N/A;   dental work     cap placed 02-02-16   LEG SURGERY     had non cancerous tumors removed from left leg   SINUS ENDO W/FUSION     x3   TUBAL LIGATION     WISDOM TOOTH EXTRACTION     Patient Active Problem List   Diagnosis Date Noted   Hyperlipidemia 07/01/2023   Chest pain, rule out acute myocardial infarction 06/30/2023   Migraine 06/30/2023   Diarrhea 07/06/2022   Status post cholecystectomy 04/13/2021   Acute cholecystitis 04/10/2021   RUQ abdominal pain 05/14/2014   Abdominal pain, epigastric 05/14/2014   Nausea without vomiting 05/14/2014   Hypothyroidism 05/14/2014   GERD (gastroesophageal reflux disease) 05/14/2014   Asthma, chronic 05/14/2014    PCP: Alben Therisa MATSU, PA REFERRING PROVIDER: Skeet Juliene SAUNDERS, DO  REFERRING DIAG:  M54.2 (ICD-10-CM) - Cervicalgia  THERAPY DIAG:  Dizziness and giddiness  Cervicalgia  Unsteadiness on feet  ONSET DATE: 1 year  Rationale for Evaluation and Treatment: Rehabilitation  SUBJECTIVE:   SUBJECTIVE STATEMENT: [name]  Patient reports doing well. Does have a slight HA. [name] daroff exercises going okay, more intense side does seem to alternate. Denies falls.   Pt accompanied by: self (she is still able to drive herself)  PERTINENT HISTORY: GERD, hypothyroidism, kidney disease, depression, blepharospasm  PAIN:  Are you having pain? Yes: NPRS scale: 2/10 Pain location: headache starting at temples wrapping posteriorly Pain description: tight, caplike pressure from back of neck to top of head Aggravating factors: very loud or very bright lights Relieving factors: heat  PRECAUTIONS: Fall  RED FLAGS: None   WEIGHT BEARING RESTRICTIONS: No  FALLS: Has patient fallen in last 6 months? No  LIVING ENVIRONMENT: Lives with: lives with their spouse Lives in: House/apartment Stairs: Yes: Internal: 15 steps; can reach both and chair lift and External: 15 steps; can reach both Has following equipment at home: Single point cane, Walker - 2 wheeled, Environmental consultant - 4 wheeled, Wheelchair (manual), Shower bench, Grab bars, and chair lift to basement; walk-in  shower  PLOF: Independent; pt is primary caregiver for mother and husband  PATIENT GOALS: just to try to loosen my neck up to eliminate or decrease these headaches.  OBJECTIVE:  Note: Objective measures were completed at Evaluation unless otherwise noted.                                                                                                            TREATMENT   Vitals:   04/10/24 1152  BP: 127/67  Pulse: 64    VESTIBULAR ASSESSMENT    POSITIONAL TESTING: Right Dix-Hallpike: upbeating, right nystagmus Right Sidelying: upbeating, right nystagmus Left Sidelying: no nystagmus    NMR:   VESTIBULAR  TREATMENT: Canalith repositioning:  R Epley x2  -mild dizziness in position 2 on rep 1  -apparent resolution of BPPV  PATIENT EDUCATION: Education details:  exam results, Epley maneuver  Person educated: Patient Education method: Explanation, Demonstration, Verbal cues, and Handouts Education comprehension: verbalized understanding, returned demonstration, and needs further education  HOME EXERCISE PROGRAM: Access Code: [email or web address] URL: [email or web address] Date: 04/03/2024 Prepared by: Sheffield Senate  Exercises - Seated Gentle Upper Trapezius Stretch  - 1 x daily - 4 x weekly - 1 sets - 3 reps - 30 seconds hold - Romberg Stance Eyes Closed on Foam Pad  - 1 x daily - 4 x weekly - 1 sets - 3 reps - 30 seconds hold - Seated Chin Tuck  - 1 x daily - 4 x weekly - 2 sets - 10 reps - Hooklying Suboccipital Release with Fingers  - 1 x daily - 4 x weekly - 1-2 sets - 10 reps - Supine Suboccipital Release with Tennis Balls  - 1 x daily - 4 x weekly - 1-2 sets - 10 reps - Brandt-Daroff Vestibular Exercise  - 2 x daily - 7 x weekly - 4-5 reps  GOALS: Goals reviewed with patient? Yes  SHORT TERM GOALS: Target date: 04/13/2024  Pt will be independent and compliant with introductory cervical stretching/strength, vestibular function, and balance focused HEP in order to maintain functional progress and improve mobility. Baseline:  To be established. Goal status: INITIAL  2.  Pt will decrease 5xSTS to </=12 seconds w/o UE support in order to demonstrate decreased risk for falls and improved functional bilateral LE strength and power. Baseline: 15.28 sec no UE support Goal status: INITIAL  3.  Pt will improve NDI Score to </= 21/50 in order to demonstrate improved pain management and quality of life. Baseline: 26/50 Goal status: INITIAL  4.  mCTSIB to be assessed w/ goal set as appropriate. Baseline: goal not needed  Goal status: MET  5.  MSQ to be assessed w/ LTG set as  appropriate. Baseline:goal written  Goal status: MET  LONG TERM GOALS: Target date: 05/11/2024  Pt will be independent and compliant with advanced and finalized cervical stretching/strength, vestibular function, and balance focused HEP in order to maintain functional progress and improve mobility. Baseline: To be established. Goal status: INITIAL  2.  Pt will report </=5 headaches per week in  order to demonstrate improved pain management and quality of life. Baseline: almost everyday Goal status: INITIAL  3.  Pt will report 0/5 on turning, head motions, and supine to sit in order to demo improved motion sensitivity. Baseline: 1-2/5 (see chart on 8/26) Goal status: INITIAL  4.  Pt will improve NDI Score to </= 16/50 in order to demonstrate improved pain management and quality of life. Baseline: 26/50 Goal status: INITIAL  5.  Patient w/ improve DHI to </=55 in order to demonstrate improved daily participation and symptom management. Baseline: 72 Goal status: INITIAL  6.  mCTSIB to be assessed w/ goal set as appropriate. Baseline: 120/120 (8/11) Goal status: REVISED-D/C'd 8/11  ASSESSMENT:  CLINICAL IMPRESSION: Patient seen for skilled PT session with emphasis on canalith repositioning. Patient with + dix hallpike consistent with R posterior canal canalithiasis. Treated with Epley x2 with good results. Noted mild dizziness with minimal torsional nystagmus in position 2 of rep 1, though tolerated well. Instructed patient to minimize putting head in dependent position, or putting head back into position akin to dix hallpike. Patient verbalized understanding. Continue POC.    OBJECTIVE IMPAIRMENTS: decreased activity tolerance, decreased knowledge of condition, decreased ROM, decreased strength, increased edema, increased fascial restrictions, impaired flexibility, improper body mechanics, postural dysfunction, and pain.   ACTIVITY LIMITATIONS: carrying, lifting, bending, standing,  squatting, stairs, transfers, bed mobility, and reach over head  PARTICIPATION LIMITATIONS: interpersonal relationship, driving, shopping, community activity, and yard work  PERSONAL FACTORS: Fitness, Past/current experiences, Time since onset of injury/illness/exacerbation, and 1-2 comorbidities: ocular spasms, depression are also affecting patient's functional outcome.   REHAB POTENTIAL: Good  CLINICAL DECISION MAKING: Unstable/unpredictable  EVALUATION COMPLEXITY: High   PLAN:  PT FREQUENCY: 1x/week (pt preference due to caregiving duties)  PT DURATION: 8 weeks  PLANNED INTERVENTIONS: 97164- PT Re-evaluation, 97750- Physical Performance Testing, 97110-Therapeutic exercises, 97530- Therapeutic activity, [id number]- Neuromuscular re-education, 97535- Self Care, [id number]- Manual therapy, [id number]- Gait training, [phone]- Canalith repositioning, [id number]- Aquatic Therapy, [phone]- Electrical stimulation (manual), [phone] (1-2 muscles), [phone] (3+ muscles)- Dry Needling, Patient/Family education, Balance training, Stair training, Taping, Joint mobilization, Spinal mobilization, Vestibular training, DME instructions, Cryotherapy, and Moist heat  PLAN FOR NEXT SESSION: how were brandt daroff exercises? Re-check positional testing as needed Work on VOR, head motions, saccades, smooth pursuits, balance with EC  Recheck R posterior canal and STG    Delon DELENA Pop, PT Delon DELENA Pop, PT, DPT, CBIS  04/10/2024, 12:26 PM

## 2024-04-16 ENCOUNTER — Ambulatory Visit

## 2024-04-16 ENCOUNTER — Encounter: Admitting: Physical Therapy

## 2024-04-23 ENCOUNTER — Encounter: Payer: Self-pay | Admitting: Physical Therapy

## 2024-04-23 ENCOUNTER — Ambulatory Visit: Admitting: Physical Therapy

## 2024-04-23 VITALS — BP 105/75 | HR 87

## 2024-04-23 DIAGNOSIS — R2681 Unsteadiness on feet: Secondary | ICD-10-CM

## 2024-04-23 DIAGNOSIS — R42 Dizziness and giddiness: Secondary | ICD-10-CM | POA: Diagnosis not present

## 2024-04-23 DIAGNOSIS — M542 Cervicalgia: Secondary | ICD-10-CM

## 2024-04-23 NOTE — Therapy (Signed)
 OUTPATIENT PHYSICAL THERAPY VESTIBULAR/CERVICAL TREATMENT     Patient Name: [name] MRN: [id number] DOB:February 21, 1956, 68 y.o., female Today's Date: 04/23/2024  END OF SESSION:  PT End of Session - 04/23/24 1105     Visit Number 6    Number of Visits 9    Date for PT Re-Evaluation 05/25/24    Authorization Type HTA    Progress Note Due on Visit 10    PT Start Time 1103    PT Stop Time 1142    PT Time Calculation (min) 39 min    Activity Tolerance Patient tolerated treatment well    Behavior During Therapy WFL for tasks assessed/performed          Past Medical History:  Diagnosis Date   Acid reflux    Apocrine metaplasia of breast    Asthma    BV (bacterial vaginosis)    Depression    h/o   Endometriosis    h/o   HSV-1 (herpes simplex virus 1) infection    Hyperlipidemia    Hypothyroid    Kidney disease    Obese    Seasonal allergies    Past Surgical History:  Procedure Laterality Date   ABDOMINAL HYSTERECTOMY     CESAREAN SECTION     x3   CHOLECYSTECTOMY N/A 04/11/2021   Procedure: LAPAROSCOPIC CHOLECYSTECTOMY;  Surgeon: Ebbie Cough, MD;  Location: [facility] OR;  Service: General;  Laterality: N/A;   dental work     cap placed 02-02-16   LEG SURGERY     had non cancerous tumors removed from left leg   SINUS ENDO W/FUSION     x3   TUBAL LIGATION     WISDOM TOOTH EXTRACTION     Patient Active Problem List   Diagnosis Date Noted   Hyperlipidemia 07/01/2023   Chest pain, rule out acute myocardial infarction 06/30/2023   Migraine 06/30/2023   Diarrhea 07/06/2022   Status post cholecystectomy 04/13/2021   Acute cholecystitis 04/10/2021   RUQ abdominal pain 05/14/2014   Abdominal pain, epigastric 05/14/2014   Nausea without vomiting 05/14/2014   Hypothyroidism 05/14/2014   GERD (gastroesophageal reflux disease) 05/14/2014   Asthma, chronic 05/14/2014    PCP: Alben Therisa MATSU, PA REFERRING PROVIDER: Skeet Juliene SAUNDERS, DO  REFERRING DIAG: M54.2  (ICD-10-CM) - Cervicalgia  THERAPY DIAG:  Dizziness and giddiness  Cervicalgia  Unsteadiness on feet  ONSET DATE: 1 year  Rationale for Evaluation and Treatment: Rehabilitation  SUBJECTIVE:   SUBJECTIVE STATEMENT: [name]  Had to cancel last week due to her mom being in the hospital. Haven't been able to do her PT exercises.  Dizziness has been better since doing the Epley maneuver last time. Has not had the bad headaches as much, just stress related. Has not noticed any dizziness since she was last here.  Pt accompanied by: self (she is still able to drive herself)  PERTINENT HISTORY: GERD, hypothyroidism, kidney disease, depression, blepharospasm  PAIN:  Are you having pain? Yes: NPRS scale: 2/10 Pain location: headache starting at temples wrapping posteriorly Pain description: tight, caplike pressure from back of neck to top of head Aggravating factors: very loud or very bright lights Relieving factors: heat  PRECAUTIONS: Fall  RED FLAGS: None   WEIGHT BEARING RESTRICTIONS: No  FALLS: Has patient fallen in last 6 months? No  LIVING ENVIRONMENT: Lives with: lives with their spouse Lives in: House/apartment Stairs: Yes: Internal: 15 steps; can reach both and chair lift and External: 15 steps; can reach both Has  following equipment at home: Single point cane, Walker - 2 wheeled, Environmental consultant - 4 wheeled, Wheelchair (manual), Shower bench, Grab bars, and chair lift to basement; walk-in shower  PLOF: Independent; pt is primary caregiver for mother and husband  PATIENT GOALS: just to try to loosen my neck up to eliminate or decrease these headaches.  OBJECTIVE:  Note: Objective measures were completed at Evaluation unless otherwise noted.                                                                                                            TREATMENT    Therapeutic Activity: Vitals:   04/23/24 1111  BP: 105/75  Pulse: 87   NDI: 14/50  DHI: 26/100  5x sit  <> stand: 9.6 seconds   VESTIBULAR ASSESSMENT    POSITIONAL TESTING: Right Dix-Hallpike: no nystagmus Left Dix-Hallpike: no nystagmus Right Sidelying: no nystagmus Left Sidelying: no nystagmus  Pt reporting no vertigo, just a little bit of lightheadedness with return to upright   Seated head turns 5 reps: no dizziness Seated head nods 5 reps: no dizziness    NMR:  Gaze Adaptation: x1 Viewing Horizontal: Position: Standing, Time: 30 seconds, Reps: 2, and Comment: pt reporting mild dizziness, felt like she was moving some afterwards  x1 Viewing Vertical:  Position: Standing, Time: 30 seconds, Reps: 2, and Comment: pt reports not as bad as side to side, not feeling as boat like   Habituation to bending:  Standing and picking up 6 cones on level ground, no dizziness, performed 2 sets of 6 reps standing on air ex, pt initially with dizziness and did not have any with 2nd set    PATIENT EDUCATION: Education details: discussed resolution of BPPV, results of goals, continue with HEP and addition of standing VOR x1 for gaze stabilization  Person educated: Patient Education method: Explanation, Demonstration, Verbal cues, and Handouts Education comprehension: verbalized understanding, returned demonstration, and needs further education  HOME EXERCISE PROGRAM: Standing VOR x 1   Access Code: [email or web address] URL: [email or web address] Date: 04/03/2024 Prepared by: Sheffield Senate  Exercises - Seated Gentle Upper Trapezius Stretch  - 1 x daily - 4 x weekly - 1 sets - 3 reps - 30 seconds hold - Romberg Stance Eyes Closed on Foam Pad  - 1 x daily - 4 x weekly - 1 sets - 3 reps - 30 seconds hold - Seated Chin Tuck  - 1 x daily - 4 x weekly - 2 sets - 10 reps - Hooklying Suboccipital Release with Fingers  - 1 x daily - 4 x weekly - 1-2 sets - 10 reps - Supine Suboccipital Release with Tennis Balls  - 1 x daily - 4 x weekly - 1-2 sets - 10 reps - Brandt-Daroff Vestibular Exercise  -  2 x daily - 7 x weekly - 4-5 reps  GOALS: Goals reviewed with patient? Yes  SHORT TERM GOALS: Target date: 04/13/2024  Pt will be independent and compliant with introductory cervical stretching/strength, vestibular function, and balance focused HEP in order to maintain functional progress  and improve mobility. Baseline:  pt performing when able  Goal status: MET  2.  Pt will decrease 5xSTS to </=12 seconds w/o UE support in order to demonstrate decreased risk for falls and improved functional bilateral LE strength and power. Baseline: 15.28 sec no UE support  9.6 seconds with no UE support  Goal status: MET  3.  Pt will improve NDI Score to </= 21/50 in order to demonstrate improved pain management and quality of life. Baseline: 26/50  14/50 (9/15) Goal status: MET  4.  mCTSIB to be assessed w/ goal set as appropriate. Baseline: goal not needed  Goal status: MET  5.  MSQ to be assessed w/ LTG set as appropriate. Baseline:goal written  Goal status: MET  LONG TERM GOALS: Target date: 05/11/2024  Pt will be independent and compliant with advanced and finalized cervical stretching/strength, vestibular function, and balance focused HEP in order to maintain functional progress and improve mobility. Baseline: To be established. Goal status: INITIAL  2.  Pt will report </=5 headaches per week in order to demonstrate improved pain management and quality of life. Baseline: almost everyday Goal status: INITIAL  3.  Pt will report 0/5 on turning, head motions, and supine to sit in order to demo improved motion sensitivity. Baseline: 1-2/5 (see chart on 8/26) Goal status: INITIAL  4.  Pt will improve NDI Score to </= 16/50 in order to demonstrate improved pain management and quality of life. Baseline: 26/50 14/50 on 9/15 Goal status: MET  5.  Patient w/ improve DHI to </=55 in order to demonstrate improved daily participation and symptom management. Baseline: 72  26 (9/15) Goal  status: MET  6.  mCTSIB to be assessed w/ goal set as appropriate. Baseline: 120/120 (8/11) Goal status: REVISED-D/C'd 8/11  ASSESSMENT:  CLINICAL IMPRESSION: Re-assessed positional testing today with pt negative for BPPV. Pt did have some lightheadedness with return to upright, but no vertigo or nystagmus noted today. Encouraged pt to continue to work on [employer] at home. Checked STGs today with pt meeting all STGs. Pt even met LTGs in regards to [facility] and NDI, indicating significant improvement in neck pain as well as dizziness. Remainder of session focused on adding to HEP for gaze stabilization in standing as pt had mild symptoms in the horizontal direction. Pt may be able to D/C at next session depending on symptoms and progress. Will continue per POC.     OBJECTIVE IMPAIRMENTS: decreased activity tolerance, decreased knowledge of condition, decreased ROM, decreased strength, increased edema, increased fascial restrictions, impaired flexibility, improper body mechanics, postural dysfunction, and pain.   ACTIVITY LIMITATIONS: carrying, lifting, bending, standing, squatting, stairs, transfers, bed mobility, and reach over head  PARTICIPATION LIMITATIONS: interpersonal relationship, driving, shopping, community activity, and yard work  PERSONAL FACTORS: Fitness, Past/current experiences, Time since onset of injury/illness/exacerbation, and 1-2 comorbidities: ocular spasms, depression are also affecting patient's functional outcome.   REHAB POTENTIAL: Good  CLINICAL DECISION MAKING: Unstable/unpredictable  EVALUATION COMPLEXITY: High   PLAN:  PT FREQUENCY: 1x/week (pt preference due to caregiving duties)  PT DURATION: 8 weeks  PLANNED INTERVENTIONS: 97164- PT Re-evaluation, 97750- Physical Performance Testing, 97110-Therapeutic exercises, 97530- Therapeutic activity, [id number]- Neuromuscular re-education, 97535- Self Care, 02859- Manual therapy, [id number]- Gait training, [phone]- Canalith  repositioning, [id number]- Aquatic Therapy, [phone]- Electrical stimulation (manual), [phone] (1-2 muscles), [phone] (3+ muscles)- Dry Needling, Patient/Family education, Balance training, Stair training, Taping, Joint mobilization, Spinal mobilization, Vestibular training, DME instructions, Cryotherapy, and Moist heat  PLAN FOR NEXT SESSION: how were  brandt daroff exercises? Re-check positional testing as needed Work on progressing VOR, check FGA?  print out self epley for self management if needed in the future, D/C if everything looks good and pt not having dizziness?   Sheffield Senate, PT, DPT 04/23/24 11:51 AM

## 2024-04-23 NOTE — Patient Instructions (Signed)
Gaze Stabilization: Standing Feet Apart    Feet shoulder width apart, keeping eyes on target on wall a few feet away, tilt head down 15-30 and move head side to side for _30___ seconds. Repeat while moving head up and down for __30__ seconds.  Perform 3 sets of each.  Do _2___ sessions per day.  Use a plain background   Copyright  VHI. All rights reserved.

## 2024-04-30 ENCOUNTER — Ambulatory Visit

## 2024-04-30 DIAGNOSIS — R42 Dizziness and giddiness: Secondary | ICD-10-CM

## 2024-04-30 DIAGNOSIS — M542 Cervicalgia: Secondary | ICD-10-CM

## 2024-04-30 DIAGNOSIS — R2681 Unsteadiness on feet: Secondary | ICD-10-CM

## 2024-04-30 NOTE — Therapy (Signed)
 OUTPATIENT PHYSICAL THERAPY VESTIBULAR/CERVICAL TREATMENT/ DISCHARGE SUMMARY      Patient Name: [name] MRN: [id number] DOB:12/23/1955, 68 y.o., female Today's Date: 04/30/2024  PHYSICAL THERAPY DISCHARGE SUMMARY  Visits from Start of Care: 7  Current functional level related to goals / functional outcomes: See below   Remaining deficits: Mild, intermittent HA   Education / Equipment: PT POC, HEP   Patient agrees to discharge. Patient goals were partially met. Patient is being discharged due to being pleased with the current functional level.  END OF SESSION:  PT End of Session - 04/30/24 1101     Visit Number 7    Number of Visits 9    Date for Recertification  05/25/24    Authorization Type HTA    Progress Note Due on Visit 10    PT Start Time 1100    PT Stop Time 1115   d/c, pt request   PT Time Calculation (min) 15 min    Activity Tolerance Patient tolerated treatment well    Behavior During Therapy WFL for tasks assessed/performed          Past Medical History:  Diagnosis Date   Acid reflux    Apocrine metaplasia of breast    Asthma    BV (bacterial vaginosis)    Depression    h/o   Endometriosis    h/o   HSV-1 (herpes simplex virus 1) infection    Hyperlipidemia    Hypothyroid    Kidney disease    Obese    Seasonal allergies    Past Surgical History:  Procedure Laterality Date   ABDOMINAL HYSTERECTOMY     CESAREAN SECTION     x3   CHOLECYSTECTOMY N/A 04/11/2021   Procedure: LAPAROSCOPIC CHOLECYSTECTOMY;  Surgeon: Ebbie Cough, MD;  Location: [facility] OR;  Service: General;  Laterality: N/A;   dental work     cap placed 02-02-16   LEG SURGERY     had non cancerous tumors removed from left leg   SINUS ENDO W/FUSION     x3   TUBAL LIGATION     WISDOM TOOTH EXTRACTION     Patient Active Problem List   Diagnosis Date Noted   Hyperlipidemia 07/01/2023   Chest pain, rule out acute myocardial infarction 06/30/2023   Migraine  06/30/2023   Diarrhea 07/06/2022   Status post cholecystectomy 04/13/2021   Acute cholecystitis 04/10/2021   RUQ abdominal pain 05/14/2014   Abdominal pain, epigastric 05/14/2014   Nausea without vomiting 05/14/2014   Hypothyroidism 05/14/2014   GERD (gastroesophageal reflux disease) 05/14/2014   Asthma, chronic 05/14/2014    PCP: Alben Therisa MATSU, PA REFERRING PROVIDER: Skeet Juliene SAUNDERS, DO  REFERRING DIAG: M54.2 (ICD-10-CM) - Cervicalgia  THERAPY DIAG:  Dizziness and giddiness  Cervicalgia  Unsteadiness on feet  ONSET DATE: 1 year  Rationale for Evaluation and Treatment: Rehabilitation  SUBJECTIVE:   SUBJECTIVE STATEMENT: [name] Patient arrives to clinic alone. Reports that dizziness is more or less gone and neck pain is more tension than pain. Requesting that this be her last appt. Denies falls.   Pt accompanied by: self (she is still able to drive herself)  PERTINENT HISTORY: GERD, hypothyroidism, kidney disease, depression, blepharospasm  PAIN:  Are you having pain? Yes: NPRS scale: 1/10 Pain location: headache starting at temples wrapping posteriorly Pain description: tight, caplike pressure from back of neck to top of head Aggravating factors: very loud or very bright lights Relieving factors: heat  PRECAUTIONS: Fall   PATIENT GOALS: just  to try to loosen my neck up to eliminate or decrease these headaches.  OBJECTIVE:  Note: Objective measures were completed at Evaluation unless otherwise noted.                                                                                                            TREATMENT    Therapeutic Activity: There were no vitals filed for this visit.  VESTIBULAR ASSESSMENT  Motion Sensitivity Quotient  Intensity: 0 = none, 1 = Lightheaded, 2 = Mild, 3 = Moderate, 4 = Severe, 5 = Vomiting  Intensity  1. Sitting to supine 0  2. Supine to L side 0  3. Supine to R side 0  4. Supine to sitting 1  5. L Hallpike-Dix   6.  Up from L    7. R Hallpike-Dix   8. Up from R    9. Sitting, head  tipped to L knee 0  10. Head up from L  knee 0  11. Sitting, head  tipped to R knee 0  12. Head up from R  knee 0  13. Sitting head turns x5 0  14.Sitting head nods x5 0  15. In stance, 180  turn to L    16. In stance, 180  turn to R    -LTG review (see below)   PATIENT EDUCATION: Education details: PT POC, Exam results Person educated: Patient Education method: Explanation, Demonstration, Verbal cues, and Handouts Education comprehension: verbalized understanding, returned demonstration, and needs further education  HOME EXERCISE PROGRAM: Standing VOR x 1   Access Code: [email or web address] URL: [email or web address] Date: 04/03/2024 Prepared by: Sheffield Senate  Exercises - Seated Gentle Upper Trapezius Stretch  - 1 x daily - 4 x weekly - 1 sets - 3 reps - 30 seconds hold - Romberg Stance Eyes Closed on Foam Pad  - 1 x daily - 4 x weekly - 1 sets - 3 reps - 30 seconds hold - Seated Chin Tuck  - 1 x daily - 4 x weekly - 2 sets - 10 reps - Hooklying Suboccipital Release with Fingers  - 1 x daily - 4 x weekly - 1-2 sets - 10 reps - Supine Suboccipital Release with Tennis Balls  - 1 x daily - 4 x weekly - 1-2 sets - 10 reps - Brandt-Daroff Vestibular Exercise  - 2 x daily - 7 x weekly - 4-5 reps  GOALS: Goals reviewed with patient? Yes  SHORT TERM GOALS: Target date: 04/13/2024  Pt will be independent and compliant with introductory cervical stretching/strength, vestibular function, and balance focused HEP in order to maintain functional progress and improve mobility. Baseline:  pt performing when able  Goal status: MET  2.  Pt will decrease 5xSTS to </=12 seconds w/o UE support in order to demonstrate decreased risk for falls and improved functional bilateral LE strength and power. Baseline: 15.28 sec no UE support  9.6 seconds with no UE support  Goal status: MET  3.  Pt will improve NDI  Score to </= 21/50 in order to  demonstrate improved pain management and quality of life. Baseline: 26/50  14/50 (9/15) Goal status: MET  4.  mCTSIB to be assessed w/ goal set as appropriate. Baseline: goal not needed  Goal status: MET  5.  MSQ to be assessed w/ LTG set as appropriate. Baseline:goal written  Goal status: MET  LONG TERM GOALS: Target date: 05/11/2024  Pt will be independent and compliant with advanced and finalized cervical stretching/strength, vestibular function, and balance focused HEP in order to maintain functional progress and improve mobility. Baseline: To be established; provided Goal status: MET  2.  Pt will report </=5 headaches per week in order to demonstrate improved pain management and quality of life. Baseline: almost everyday; 2-3/ week and less intense Goal status: MET  3.  Pt will report 0/5 on turning, head motions, and supine to sit in order to demo improved motion sensitivity. Baseline: 1-2/5 (see chart on 8/26); 1/5 Goal status: NOT MET  4.  Pt will improve NDI Score to </= 16/50 in order to demonstrate improved pain management and quality of life. Baseline: 26/50 14/50 on 9/15 Goal status: MET  5.  Patient w/ improve DHI to </=55 in order to demonstrate improved daily participation and symptom management. Baseline: 72  26 (9/15) Goal status: MET  6.  mCTSIB to be assessed w/ goal set as appropriate. Baseline: 120/120 (8/11) Goal status: REVISED-D/C'd 8/11  ASSESSMENT:  CLINICAL IMPRESSION: Patient seen for skilled PT session with emphasis on goal assessment and dc. Patient agreeable to dc. She met 5/6 LTG. She reports very mild neck tension that sometimes results in a mild tension HA. She is currently experiencing multiple, significant life stressors and so could be contributing to this experience. She also has very mild motion sensitivity from returning to sitting upright from supine. Patient agreeable to dc at this time and may  return with a new referral if needed.    OBJECTIVE IMPAIRMENTS: decreased activity tolerance, decreased knowledge of condition, decreased ROM, decreased strength, increased edema, increased fascial restrictions, impaired flexibility, improper body mechanics, postural dysfunction, and pain.   ACTIVITY LIMITATIONS: carrying, lifting, bending, standing, squatting, stairs, transfers, bed mobility, and reach over head  PARTICIPATION LIMITATIONS: interpersonal relationship, driving, shopping, community activity, and yard work  PERSONAL FACTORS: Fitness, Past/current experiences, Time since onset of injury/illness/exacerbation, and 1-2 comorbidities: ocular spasms, depression are also affecting patient's functional outcome.   REHAB POTENTIAL: Good  CLINICAL DECISION MAKING: Unstable/unpredictable  EVALUATION COMPLEXITY: High   PLAN:  PT FREQUENCY: 1x/week (pt preference due to caregiving duties)  PT DURATION: 8 weeks  PLANNED INTERVENTIONS: 97164- PT Re-evaluation, 97750- Physical Performance Testing, 97110-Therapeutic exercises, 97530- Therapeutic activity, [id number]- Neuromuscular re-education, 97535- Self Care, [id number]- Manual therapy, [id number]- Gait training, [phone]- Canalith repositioning, [id number]- Aquatic Therapy, [phone]- Electrical stimulation (manual), [phone] (1-2 muscles), [id number] (3+ muscles)- Dry Needling, Patient/Family education, Balance training, Stair training, Taping, Joint mobilization, Spinal mobilization, Vestibular training, DME instructions, Cryotherapy, and Moist heat  PLAN FOR NEXT SESSION: dc from PT  Delon DELENA Pop, PT, DPT, CBIS 04/30/24 11:33 AM

## 2024-05-06 DIAGNOSIS — N1832 Chronic kidney disease, stage 3b: Secondary | ICD-10-CM | POA: Diagnosis not present

## 2024-05-06 DIAGNOSIS — N1831 Chronic kidney disease, stage 3a: Secondary | ICD-10-CM | POA: Diagnosis not present

## 2024-05-07 ENCOUNTER — Ambulatory Visit: Admitting: Physical Therapy

## 2024-05-08 DIAGNOSIS — E039 Hypothyroidism, unspecified: Secondary | ICD-10-CM | POA: Diagnosis not present

## 2024-05-08 DIAGNOSIS — F331 Major depressive disorder, recurrent, moderate: Secondary | ICD-10-CM | POA: Diagnosis not present

## 2024-05-08 DIAGNOSIS — N1832 Chronic kidney disease, stage 3b: Secondary | ICD-10-CM | POA: Diagnosis not present

## 2024-05-08 DIAGNOSIS — N1831 Chronic kidney disease, stage 3a: Secondary | ICD-10-CM | POA: Diagnosis not present

## 2024-05-09 DIAGNOSIS — K219 Gastro-esophageal reflux disease without esophagitis: Secondary | ICD-10-CM | POA: Diagnosis not present

## 2024-05-09 DIAGNOSIS — Z0000 Encounter for general adult medical examination without abnormal findings: Secondary | ICD-10-CM | POA: Diagnosis not present

## 2024-05-09 DIAGNOSIS — E039 Hypothyroidism, unspecified: Secondary | ICD-10-CM | POA: Diagnosis not present

## 2024-05-09 DIAGNOSIS — F419 Anxiety disorder, unspecified: Secondary | ICD-10-CM | POA: Diagnosis not present

## 2024-05-09 DIAGNOSIS — N1832 Chronic kidney disease, stage 3b: Secondary | ICD-10-CM | POA: Diagnosis not present

## 2024-05-09 DIAGNOSIS — Z1331 Encounter for screening for depression: Secondary | ICD-10-CM | POA: Diagnosis not present

## 2024-05-09 DIAGNOSIS — G4700 Insomnia, unspecified: Secondary | ICD-10-CM | POA: Diagnosis not present

## 2024-05-09 DIAGNOSIS — G43809 Other migraine, not intractable, without status migrainosus: Secondary | ICD-10-CM | POA: Diagnosis not present

## 2024-05-09 DIAGNOSIS — E785 Hyperlipidemia, unspecified: Secondary | ICD-10-CM | POA: Diagnosis not present

## 2024-05-09 DIAGNOSIS — R350 Frequency of micturition: Secondary | ICD-10-CM | POA: Diagnosis not present

## 2024-05-09 DIAGNOSIS — R7303 Prediabetes: Secondary | ICD-10-CM | POA: Diagnosis not present

## 2024-05-09 DIAGNOSIS — G4733 Obstructive sleep apnea (adult) (pediatric): Secondary | ICD-10-CM | POA: Diagnosis not present

## 2024-05-21 DIAGNOSIS — M9904 Segmental and somatic dysfunction of sacral region: Secondary | ICD-10-CM | POA: Diagnosis not present

## 2024-05-21 DIAGNOSIS — M9905 Segmental and somatic dysfunction of pelvic region: Secondary | ICD-10-CM | POA: Diagnosis not present

## 2024-05-21 DIAGNOSIS — M51361 Other intervertebral disc degeneration, lumbar region with lower extremity pain only: Secondary | ICD-10-CM | POA: Diagnosis not present

## 2024-05-21 DIAGNOSIS — M9903 Segmental and somatic dysfunction of lumbar region: Secondary | ICD-10-CM | POA: Diagnosis not present

## 2024-05-23 DIAGNOSIS — M51361 Other intervertebral disc degeneration, lumbar region with lower extremity pain only: Secondary | ICD-10-CM | POA: Diagnosis not present

## 2024-05-23 DIAGNOSIS — M9904 Segmental and somatic dysfunction of sacral region: Secondary | ICD-10-CM | POA: Diagnosis not present

## 2024-05-23 DIAGNOSIS — M9905 Segmental and somatic dysfunction of pelvic region: Secondary | ICD-10-CM | POA: Diagnosis not present

## 2024-05-23 DIAGNOSIS — M9903 Segmental and somatic dysfunction of lumbar region: Secondary | ICD-10-CM | POA: Diagnosis not present

## 2024-05-28 DIAGNOSIS — N898 Other specified noninflammatory disorders of vagina: Secondary | ICD-10-CM | POA: Diagnosis not present

## 2024-05-28 DIAGNOSIS — Z01419 Encounter for gynecological examination (general) (routine) without abnormal findings: Secondary | ICD-10-CM | POA: Diagnosis not present

## 2024-05-28 DIAGNOSIS — Z13820 Encounter for screening for osteoporosis: Secondary | ICD-10-CM | POA: Diagnosis not present

## 2024-05-28 DIAGNOSIS — Z1231 Encounter for screening mammogram for malignant neoplasm of breast: Secondary | ICD-10-CM | POA: Diagnosis not present

## 2024-06-05 DIAGNOSIS — Z13820 Encounter for screening for osteoporosis: Secondary | ICD-10-CM | POA: Diagnosis not present

## 2024-06-05 DIAGNOSIS — M85851 Other specified disorders of bone density and structure, right thigh: Secondary | ICD-10-CM | POA: Diagnosis not present

## 2024-06-05 DIAGNOSIS — M8589 Other specified disorders of bone density and structure, multiple sites: Secondary | ICD-10-CM | POA: Diagnosis not present

## 2024-06-05 DIAGNOSIS — N1831 Chronic kidney disease, stage 3a: Secondary | ICD-10-CM | POA: Diagnosis not present

## 2024-06-05 DIAGNOSIS — N1832 Chronic kidney disease, stage 3b: Secondary | ICD-10-CM | POA: Diagnosis not present

## 2024-06-08 DIAGNOSIS — N1832 Chronic kidney disease, stage 3b: Secondary | ICD-10-CM | POA: Diagnosis not present

## 2024-06-08 DIAGNOSIS — F331 Major depressive disorder, recurrent, moderate: Secondary | ICD-10-CM | POA: Diagnosis not present

## 2024-06-08 DIAGNOSIS — N1831 Chronic kidney disease, stage 3a: Secondary | ICD-10-CM | POA: Diagnosis not present

## 2024-06-08 DIAGNOSIS — E039 Hypothyroidism, unspecified: Secondary | ICD-10-CM | POA: Diagnosis not present

## 2024-06-18 DIAGNOSIS — G609 Hereditary and idiopathic neuropathy, unspecified: Secondary | ICD-10-CM | POA: Diagnosis not present

## 2024-06-18 DIAGNOSIS — K121 Other forms of stomatitis: Secondary | ICD-10-CM | POA: Diagnosis not present

## 2024-06-18 DIAGNOSIS — K219 Gastro-esophageal reflux disease without esophagitis: Secondary | ICD-10-CM | POA: Diagnosis not present

## 2024-06-21 DIAGNOSIS — G245 Blepharospasm: Secondary | ICD-10-CM | POA: Diagnosis not present

## 2024-06-22 DIAGNOSIS — G4700 Insomnia, unspecified: Secondary | ICD-10-CM | POA: Diagnosis not present

## 2024-06-22 DIAGNOSIS — G4710 Hypersomnia, unspecified: Secondary | ICD-10-CM | POA: Diagnosis not present

## 2024-06-22 DIAGNOSIS — G4733 Obstructive sleep apnea (adult) (pediatric): Secondary | ICD-10-CM | POA: Diagnosis not present

## 2024-07-01 NOTE — Progress Notes (Unsigned)
 Cardiology Office Note:  .   Date:  07/02/2024  ID:  [name], DOB [birth year], MRN [id number] PCP: Alben Therisa MATSU, [place]  [facility] Providers Cardiologist:  Darryle ONEIDA Decent, MD   History of Present Illness: .    Chief Complaint  Patient presents with   Follow-up    [name] is a 68 y.o. female with history of CAD who presents for follow-up.    History of Present Illness   [name] is a 68 year old female with non-obstructive coronary artery disease who presents for a cardiovascular follow-up.  She has a history of non-obstructive coronary artery disease and was hospitalized last November. A CT scan at that time showed no blockages. She denies chest pain and is currently taking Crestor  (rosuvastatin ) 40 mg. She monitors her blood pressure daily due to previous fluctuations, using a blood pressure cuff provided by her primary care provider.  She has a history of gastroesophageal reflux disease (GERD) for which she takes medication as needed, typically for a week or two at a time. This regimen has helped manage her symptoms effectively.  She reports a recent diagnosis of blepharospasm, characterized by involuntary eyelid closure. She receives Botox injections in her eyelids and surrounding areas, which have alleviated the symptoms.  She mentions a past issue with migraines, which were resolved after physical therapy identified and treated displaced crystals. She has not experienced migraines since the treatment.  Her social history includes caring for her mother, who resides in skilled care at [facility]. Her mother had a recent health scare but recovered unexpectedly. She also notes that she is not as active as she would like to be but is working on increasing her activity level.          Problem List 1. CKD 3a 2. Hypothyroidism  3. HLD -T chol 139, HDL 60, LDL 57, TG 126 4. CAD -CAC 1 (48th percentile) -TPV 46 mm3 (mild) -mild plaque  LAD/RCA 25-49%    ROS: All other ROS reviewed and negative. Pertinent positives noted in the HPI.     Studies Reviewed: SABRA   EKG Interpretation Date/Time:  Monday July 02 2024 15:17:01 EST Ventricular Rate:  95 PR Interval:  162 QRS Duration:  78 QT Interval:  360 QTC Calculation: 452 R Axis:   -72  Text Interpretation: Normal sinus rhythm Left axis deviation Confirmed by Decent Darryle [phone]) on 07/02/2024 3:18:24 PM   CCTA 07/01/2023 IMPRESSION: 1. Mild CAD in the mid-distal LAD and proximal RCA, 25-49% stenosis, CADRADS 2.   2. Total plaque volume 46 mm3 which is 24th percentile for age- and sex-matched controls (calcified plaque 0 mm3; non-calcified plaque 46 mm3). TPV is mild.   3. Coronary calcium  score is 1, which places the patient in the 48th percentile for age and sex matched control.   4. Normal coronary origins with right dominance.   Physical Exam:   VS:  BP 122/76   Pulse 95   Ht 5' 5.5 (1.664 m)   Wt 200 lb 14.4 oz (91.1 kg)   SpO2 98%   BMI 32.92 kg/m    Wt Readings from Last 3 Encounters:  07/02/24 200 lb 14.4 oz (91.1 kg)  02/21/24 207 lb (93.9 kg)  08/24/23 220 lb (99.8 kg)    GEN: Well nourished, well developed in no acute distress NECK: No JVD; No carotid bruits CARDIAC: RRR, no murmurs, rubs, gallops RESPIRATORY:  Clear to auscultation without rales, wheezing or rhonchi  ABDOMEN: Soft, non-tender, non-distended EXTREMITIES:  No edema; No deformity  ASSESSMENT AND PLAN: .   Assessment and Plan    Atherosclerotic heart disease of native coronary artery without angina pectoris Non-infractive coronary artery disease with minimal coronary plaque. No chest pain. CT scan showed no blockages. Rosuvastatin  40 mg effective in preventing further buildup. - Continue rosuvastatin  40 mg daily. - Encouraged regular exercise and staying active.  Mixed hyperlipidemia Cholesterol levels well-controlled. LDL cholesterol at goal. - Continue current  lipid-lowering therapy.  Chronic kidney disease, 3a Chronic kidney disease well-managed with no new symptoms or changes. - Continue monitoring kidney function.              Follow-up: Return in about 1 year (around 07/02/2025).  Signed, Darryle DASEN. Barbaraann, MD, [facility]  [facility]  [address] [place], [place] [address] [phone]  3:33 PM

## 2024-07-02 ENCOUNTER — Ambulatory Visit (HOSPITAL_BASED_OUTPATIENT_CLINIC_OR_DEPARTMENT_OTHER): Admitting: Cardiovascular Disease

## 2024-07-02 ENCOUNTER — Encounter (HOSPITAL_BASED_OUTPATIENT_CLINIC_OR_DEPARTMENT_OTHER): Payer: Self-pay | Admitting: Cardiovascular Disease

## 2024-07-02 VITALS — BP 122/76 | HR 95 | Ht 65.5 in | Wt 200.9 lb

## 2024-07-02 DIAGNOSIS — E782 Mixed hyperlipidemia: Secondary | ICD-10-CM

## 2024-07-02 DIAGNOSIS — I25119 Atherosclerotic heart disease of native coronary artery with unspecified angina pectoris: Secondary | ICD-10-CM | POA: Diagnosis not present

## 2024-07-02 NOTE — Patient Instructions (Signed)
  Medication Instructions:  Your physician recommends that you continue on your current medications as directed. Please refer to the Current Medication list given to you today. *If you need a refill on your cardiac medications before your next appointment, please call your pharmacy*   Lab Work: NONE   Testing/Procedures: NONE   Follow-Up: At [facility], you and your health needs are our priority.  As part of our continuing mission to provide you with exceptional heart care, our providers are all part of one team.  This team includes your primary Cardiologist (physician) and Advanced Practice Providers or APPs (Physician Assistants and Nurse Practitioners) who all work together to provide you with the care you need, when you need it.   Your next appointment:   12 month(s)   Provider:   Dr Barbaraann or One of our Advanced Practice Providers (APPs): Morse Clause, PA-C                 Lamarr Satterfield, NP Miriam Shams, NP              Olivia Pavy, PA-C Josefa Beauvais, NP                  Leontine Salen, PA-C Orren Fabry, PA-C                 Hao Meng, PA-C Ernest Dick, NP                      Damien Braver, NP Jon Hails, PA-C                 Taylor Parcells, PA-C                          Dayna Dunn, PA-C                 Scott Weaver, PA-C [place], NPKatlyn [place], NP Callie Goodrich, PA-C             Xika Zhao, NP Sheng Haley, PA-C                              Kathleen Johnson, PA-C           We recommend signing up for the patient portal called MyChart.  Sign up information is provided on this After Visit Summary.  MyChart is used to connect with patients for Virtual Visits (Telemedicine).  Patients are able to view lab/test results, encounter notes, upcoming appointments, etc.  Non-urgent messages can be sent to your provider as well.   To learn more about what you can do with MyChart, go to [email or web address].

## 2024-07-05 DIAGNOSIS — N1831 Chronic kidney disease, stage 3a: Secondary | ICD-10-CM | POA: Diagnosis not present

## 2024-07-05 DIAGNOSIS — N1832 Chronic kidney disease, stage 3b: Secondary | ICD-10-CM | POA: Diagnosis not present

## 2024-07-08 DIAGNOSIS — N1831 Chronic kidney disease, stage 3a: Secondary | ICD-10-CM | POA: Diagnosis not present

## 2024-07-08 DIAGNOSIS — N1832 Chronic kidney disease, stage 3b: Secondary | ICD-10-CM | POA: Diagnosis not present

## 2024-07-08 DIAGNOSIS — F331 Major depressive disorder, recurrent, moderate: Secondary | ICD-10-CM | POA: Diagnosis not present

## 2024-07-08 DIAGNOSIS — E039 Hypothyroidism, unspecified: Secondary | ICD-10-CM | POA: Diagnosis not present

## 2024-10-22 ENCOUNTER — Ambulatory Visit: Admitting: Neurology
